# Patient Record
Sex: Male | Born: 1940 | Race: Black or African American | Hispanic: No | Smoking: Current every day smoker
Health system: Southern US, Community
[De-identification: ages and names within clinical notes are randomized; demographics above are authoritative.]

## PROBLEM LIST (undated history)

## (undated) DIAGNOSIS — Z720 Tobacco use: Secondary | ICD-10-CM

## (undated) DIAGNOSIS — I10 Essential (primary) hypertension: Secondary | ICD-10-CM

## (undated) DIAGNOSIS — I2510 Atherosclerotic heart disease of native coronary artery without angina pectoris: Secondary | ICD-10-CM

## (undated) HISTORY — PX: OTHER SURGICAL HISTORY: SHX169

---

## 1999-03-29 ENCOUNTER — Inpatient Hospital Stay (HOSPITAL_COMMUNITY): Admission: AD | Admit: 1999-03-29 | Discharge: 1999-03-30 | Payer: Self-pay | Admitting: Cardiology

## 2006-01-15 ENCOUNTER — Ambulatory Visit (HOSPITAL_COMMUNITY): Admission: RE | Admit: 2006-01-15 | Discharge: 2006-01-15 | Payer: Self-pay | Admitting: Family Medicine

## 2010-11-17 ENCOUNTER — Inpatient Hospital Stay (HOSPITAL_COMMUNITY)
Admission: EM | Admit: 2010-11-17 | Discharge: 2010-11-23 | DRG: 669 | Disposition: A | Discharge status: Home Health | Payer: Medicare Other | Attending: Internal Medicine | Admitting: Internal Medicine

## 2010-11-17 ENCOUNTER — Encounter: Payer: Self-pay | Admitting: Emergency Medicine

## 2010-11-17 ENCOUNTER — Emergency Department (HOSPITAL_COMMUNITY): Payer: Medicare Other

## 2010-11-17 ENCOUNTER — Other Ambulatory Visit: Payer: Self-pay

## 2010-11-17 DIAGNOSIS — I10 Essential (primary) hypertension: Secondary | ICD-10-CM | POA: Diagnosis present

## 2010-11-17 DIAGNOSIS — N3289 Other specified disorders of bladder: Secondary | ICD-10-CM | POA: Diagnosis present

## 2010-11-17 DIAGNOSIS — Z720 Tobacco use: Secondary | ICD-10-CM | POA: Diagnosis present

## 2010-11-17 DIAGNOSIS — C679 Malignant neoplasm of bladder, unspecified: Principal | ICD-10-CM | POA: Diagnosis present

## 2010-11-17 DIAGNOSIS — D62 Acute posthemorrhagic anemia: Secondary | ICD-10-CM | POA: Diagnosis present

## 2010-11-17 DIAGNOSIS — I498 Other specified cardiac arrhythmias: Secondary | ICD-10-CM | POA: Diagnosis present

## 2010-11-17 DIAGNOSIS — R000 Tachycardia, unspecified: Secondary | ICD-10-CM | POA: Diagnosis present

## 2010-11-17 DIAGNOSIS — E119 Type 2 diabetes mellitus without complications: Secondary | ICD-10-CM | POA: Diagnosis present

## 2010-11-17 DIAGNOSIS — D500 Iron deficiency anemia secondary to blood loss (chronic): Secondary | ICD-10-CM | POA: Diagnosis present

## 2010-11-17 DIAGNOSIS — F17200 Nicotine dependence, unspecified, uncomplicated: Secondary | ICD-10-CM | POA: Diagnosis present

## 2010-11-17 DIAGNOSIS — R310 Gross hematuria: Secondary | ICD-10-CM | POA: Diagnosis present

## 2010-11-17 DIAGNOSIS — E872 Acidosis, unspecified: Secondary | ICD-10-CM | POA: Diagnosis present

## 2010-11-17 DIAGNOSIS — D696 Thrombocytopenia, unspecified: Secondary | ICD-10-CM | POA: Diagnosis present

## 2010-11-17 DIAGNOSIS — D649 Anemia, unspecified: Secondary | ICD-10-CM

## 2010-11-17 HISTORY — DX: Atherosclerotic heart disease of native coronary artery without angina pectoris: I25.10

## 2010-11-17 HISTORY — DX: Tobacco use: Z72.0

## 2010-11-17 HISTORY — DX: Essential (primary) hypertension: I10

## 2010-11-17 LAB — LACTIC ACID, PLASMA: Lactic Acid, Venous: 3.4 mmol/L — ABNORMAL HIGH (ref 0.5–2.2)

## 2010-11-17 LAB — DIFFERENTIAL
Basophils Absolute: 0 10*3/uL (ref 0.0–0.1)
Basophils Relative: 0 % (ref 0–1)
Eosinophils Absolute: 0 10*3/uL (ref 0.0–0.7)
Lymphs Abs: 1.1 10*3/uL (ref 0.7–4.0)
Monocytes Relative: 7 % (ref 3–12)
Neutro Abs: 5 10*3/uL (ref 1.7–7.7)

## 2010-11-17 LAB — URINALYSIS, ROUTINE W REFLEX MICROSCOPIC
Specific Gravity, Urine: 1.02 (ref 1.005–1.030)
Urobilinogen, UA: 4 mg/dL — ABNORMAL HIGH (ref 0.0–1.0)
pH: 7 (ref 5.0–8.0)

## 2010-11-17 LAB — CBC
HCT: 14.4 % — ABNORMAL LOW (ref 39.0–52.0)
MCH: 21.3 pg — ABNORMAL LOW (ref 26.0–34.0)
MCV: 69.6 fL — ABNORMAL LOW (ref 78.0–100.0)
Platelets: 97 10*3/uL — ABNORMAL LOW (ref 150–400)
RBC: 2.07 MIL/uL — ABNORMAL LOW (ref 4.22–5.81)

## 2010-11-17 LAB — COMPREHENSIVE METABOLIC PANEL
ALT: 8 U/L (ref 0–53)
AST: 11 U/L (ref 0–37)
CO2: 20 mEq/L (ref 19–32)
Calcium: 8.5 mg/dL (ref 8.4–10.5)
GFR calc non Af Amer: 51 mL/min — ABNORMAL LOW (ref >90)

## 2010-11-17 LAB — GLUCOSE, CAPILLARY
Glucose-Capillary: 143 mg/dL — ABNORMAL HIGH (ref 70–99)
Glucose-Capillary: 145 mg/dL — ABNORMAL HIGH (ref 70–99)

## 2010-11-17 LAB — RETICULOCYTES: Retic Ct Pct: 4.2 % — ABNORMAL HIGH (ref 0.4–3.1)

## 2010-11-17 LAB — VITAMIN B12: Vitamin B-12: 269 pg/mL (ref 211–911)

## 2010-11-17 LAB — APTT: aPTT: 32 seconds (ref 24–37)

## 2010-11-17 LAB — OCCULT BLOOD, POC DEVICE: Fecal Occult Bld: NEGATIVE

## 2010-11-17 LAB — PREPARE RBC (CROSSMATCH)

## 2010-11-17 LAB — URINE MICROSCOPIC-ADD ON

## 2010-11-17 LAB — ABO/RH: ABO/RH(D): B NEG

## 2010-11-17 LAB — PROTIME-INR: Prothrombin Time: 15.4 seconds — ABNORMAL HIGH (ref 11.6–15.2)

## 2010-11-17 MED ORDER — SODIUM CHLORIDE 0.9 % IV SOLN
80.0000 mg | Freq: Once | INTRAVENOUS | Status: AC
  Administered 2010-11-17: 80 mg via INTRAVENOUS
  Filled 2010-11-17: qty 80

## 2010-11-17 MED ORDER — ONDANSETRON HCL 4 MG PO TABS: 4.0000 mg | ORAL_TABLET | Freq: Four times a day (QID) | ORAL | Status: DC | PRN

## 2010-11-17 MED ORDER — ALUM & MAG HYDROXIDE-SIMETH 200-200-20 MG/5ML PO SUSP: 30.0000 mL | Freq: Four times a day (QID) | ORAL | Status: DC | PRN

## 2010-11-17 MED ORDER — SODIUM CHLORIDE 0.9 % IV SOLN
INTRAVENOUS | Status: DC
  Filled 2010-11-17 (×2): qty 1000

## 2010-11-17 MED ORDER — ACETAMINOPHEN 325 MG PO TABS: 650.0000 mg | ORAL_TABLET | Freq: Four times a day (QID) | ORAL | Status: DC | PRN

## 2010-11-17 MED ORDER — SODIUM CHLORIDE 0.9 % IV BOLUS (SEPSIS)
1000.0000 mL | Freq: Once | INTRAVENOUS | Status: AC
  Administered 2010-11-17: 1000 mL via INTRAVENOUS

## 2010-11-17 MED ORDER — SODIUM CHLORIDE 0.9 % IJ SOLN
INTRAMUSCULAR | Status: AC
  Administered 2010-11-17: 10 mL
  Filled 2010-11-17: qty 10

## 2010-11-17 MED ORDER — GUAIFENESIN-DM 100-10 MG/5ML PO SYRP: 5.0000 mL | ORAL_SOLUTION | ORAL | Status: DC | PRN

## 2010-11-17 MED ORDER — INSULIN ASPART 100 UNIT/ML ~~LOC~~ SOLN
0.0000 [IU] | Freq: Three times a day (TID) | SUBCUTANEOUS | Status: DC
  Administered 2010-11-17 – 2010-11-23 (×10): 1 [IU] via SUBCUTANEOUS
  Filled 2010-11-17: qty 3

## 2010-11-17 MED ORDER — NICOTINE 21 MG/24HR TD PT24
21.0000 mg | MEDICATED_PATCH | Freq: Every day | TRANSDERMAL | Status: DC
  Administered 2010-11-17 – 2010-11-23 (×6): 21 mg via TRANSDERMAL
  Filled 2010-11-17 (×9): qty 1

## 2010-11-17 MED ORDER — INSULIN ASPART 100 UNIT/ML ~~LOC~~ SOLN: 0.0000 [IU] | Freq: Every day | SUBCUTANEOUS | Status: DC

## 2010-11-17 MED ORDER — POTASSIUM CHLORIDE IN NACL 20-0.9 MEQ/L-% IV SOLN
INTRAVENOUS | Status: DC
  Administered 2010-11-17: 75 mL via INTRAVENOUS

## 2010-11-17 MED ORDER — ALBUTEROL SULFATE (5 MG/ML) 0.5% IN NEBU: 2.5000 mg | INHALATION_SOLUTION | RESPIRATORY_TRACT | Status: DC | PRN

## 2010-11-17 MED ORDER — SODIUM CHLORIDE 0.9 % IV SOLN: INTRAVENOUS | Status: DC

## 2010-11-17 MED ORDER — HYDROCODONE-ACETAMINOPHEN 5-325 MG PO TABS
1.0000 | ORAL_TABLET | ORAL | Status: DC | PRN
  Administered 2010-11-21 – 2010-11-22 (×2): 2 via ORAL
  Administered 2010-11-22 (×2): 1 via ORAL
  Administered 2010-11-22: 2 via ORAL
  Administered 2010-11-22 – 2010-11-23 (×5): 1 via ORAL
  Filled 2010-11-17: qty 1
  Filled 2010-11-17: qty 2
  Filled 2010-11-17: qty 1
  Filled 2010-11-17 (×2): qty 2
  Filled 2010-11-17 (×4): qty 1
  Filled 2010-11-17: qty 2

## 2010-11-17 MED ORDER — SODIUM CHLORIDE 0.9 % IV SOLN
999.0000 mL | Freq: Once | INTRAVENOUS | Status: AC
  Administered 2010-11-17: 13:00:00 via INTRAVENOUS

## 2010-11-17 MED ORDER — ACETAMINOPHEN 650 MG RE SUPP: 650.0000 mg | Freq: Four times a day (QID) | RECTAL | Status: DC | PRN

## 2010-11-17 MED ORDER — MORPHINE SULFATE 2 MG/ML IJ SOLN
2.0000 mg | INTRAMUSCULAR | Status: DC | PRN
  Administered 2010-11-18: 2 mg via INTRAVENOUS
  Filled 2010-11-17: qty 1

## 2010-11-17 MED ORDER — ONDANSETRON HCL 4 MG/2ML IJ SOLN: 4.0000 mg | Freq: Four times a day (QID) | INTRAMUSCULAR | Status: DC | PRN

## 2010-11-17 MED ORDER — SALINE SPRAY 0.65 % NA SOLN
1.0000 | NASAL | Status: DC | PRN
  Filled 2010-11-17: qty 44

## 2010-11-17 MED ORDER — INSULIN ASPART 100 UNIT/ML ~~LOC~~ SOLN: 0.0000 [IU] | SUBCUTANEOUS | Status: DC

## 2010-11-17 MED ORDER — DOCUSATE SODIUM 100 MG PO CAPS
100.0000 mg | ORAL_CAPSULE | Freq: Every day | ORAL | Status: DC
  Administered 2010-11-17 – 2010-11-23 (×6): 100 mg via ORAL
  Filled 2010-11-17 (×7): qty 1

## 2010-11-17 MED ORDER — DEXTROSE 5 % IV SOLN
1.0000 g | INTRAVENOUS | Status: DC
  Administered 2010-11-17 – 2010-11-22 (×4): 1 g via INTRAVENOUS
  Filled 2010-11-17 (×7): qty 1

## 2010-11-17 NOTE — ED Notes (Signed)
Report given to icu. Pt in [place].

## 2010-11-17 NOTE — ED Provider Notes (Signed)
History   Chart scribed for Charlena Cross, MD by Caryl Bis; the patient was seen in room [facility]; this patient's care was started at 1:00 PM.    CSN: [id number] Arrival date & time: 11/17/2010 11:54 AM  Chief Complaint  Patient presents with  . Hematuria  . Rectal Bleeding   HPI Isaac Hunter is a 70 y.o. male who presents to the Emergency Department complaining of hematuria. Pt reports hematuria of dark red blood onset "a few months ago" and has been occuring almost daily for the past month with associated persistent dizziness and generalized weakness. Also c/o mild DOE.  Also reports dark stools but denies any gross blood in stool. No h/o GI bleeding. Denies any pain with urination, dysuria, flank pain or abd pain. No recent injury, fall, or trauma. No n/v/d, f/c, syncope, or cp. No aggravating or alleviating factors. Pt has not been evaluated for these sx previously.  No PCP  Past Medical History  Diagnosis Date  . Hypertension     History reviewed. No pertinent past surgical history.  No family history on file.  History  Substance Use Topics  . Smoking status: Current Everyday Smoker -- 1.0 packs/day    Types: Cigarettes  . Smokeless tobacco: Not on file  . Alcohol Use: No      Review of Systems  Constitutional: Negative for fever and chills.  HENT: Negative for neck pain.   Respiratory: Negative for cough and shortness of breath.   Cardiovascular: Negative for chest pain and palpitations.  Gastrointestinal: Negative for nausea, vomiting, abdominal pain, diarrhea, blood in stool and anal bleeding.  Genitourinary: Positive for hematuria. Negative for dysuria, frequency and flank pain.  Musculoskeletal: Negative for myalgias and back pain.  Skin: Negative for rash and wound.  Neurological: Positive for dizziness. Negative for numbness and headaches.    Allergies  Review of patient's allergies indicates no known allergies.  Home Medications  No  current outpatient prescriptions on file.  BP 131/80  Pulse 103  Temp 97.4 F (36.3 C)  Resp 20  Ht 6' (1.829 m)  Wt 208 lb (94.348 kg)  BMI 28.21 kg/m2  SpO2 100%  Physical Exam  Constitutional: He is oriented to person, place, and time. He appears well-developed and well-nourished. No distress.  HENT:  Head: Normocephalic and atraumatic.  Nose: Nose normal.  Mouth/Throat: Oropharynx is clear and moist.  Eyes:       Conjunctival pallor  Neck: Normal range of motion. Neck supple.  Cardiovascular: Regular rhythm and intact distal pulses.  Exam reveals no gallop and no friction rub.   No murmur heard.      tachycardic  Pulmonary/Chest: Effort normal and breath sounds normal. No respiratory distress. He has no wheezes. He has no rales.  Abdominal: Soft. Bowel sounds are normal. He exhibits no distension. There is no tenderness.  Genitourinary: Guaiac negative stool.       Rectal exam: normal tone, stool color normal, no gross blood, hemoccult negative  Musculoskeletal: Normal range of motion. He exhibits no edema and no tenderness.  Neurological: He is alert and oriented to person, place, and time.  Skin: Skin is warm and dry. There is pallor.    ED Course  Procedures  Labs Reviewed  CBC - Abnormal; Notable for the following:    RBC 2.07 (*)    Hemoglobin 4.4 (*)    HCT 14.4 (*) RESULT REPEATED AND VERIFIED   MCV 69.6 (*)    MCH 21.3 (*)  RDW 16.2 (*)    Platelets 97 (*)    All other components within normal limits  COMPREHENSIVE METABOLIC PANEL - Abnormal; Notable for the following:    Glucose, Bld 153 (*)    Albumin 3.1 (*)    GFR calc non Af Amer 51 (*)    GFR calc Af Amer 59 (*)    All other components within normal limits  LACTIC ACID, PLASMA - Abnormal; Notable for the following:    Lactic Acid, Venous 3.4 (*)    All other components within normal limits  PROTIME-INR - Abnormal; Notable for the following:    Prothrombin Time 15.4 (*)    All other  components within normal limits  DIFFERENTIAL  LIPASE, BLOOD  APTT  OCCULT BLOOD, POC DEVICE  POCT OCCULT BLOOD STOOL, DEVICE  URINALYSIS, ROUTINE W REFLEX MICROSCOPIC  URINE CULTURE  TYPE AND SCREEN  PREPARE RBC (CROSSMATCH)  VITAMIN B12  FOLATE  IRON AND TIBC  FERRITIN  RETICULOCYTES   No results found.   Date: 11/17/2010  Rate: 104  Rhythm: sinus tachycardia  QRS Axis: normal  Intervals: QRS prolonged, QT prolonged  ST/T Wave abnormalities: nonspecific ST/T changes  Conduction Disutrbances:right bundle branch block  Narrative Interpretation: left ventricular hypertrophy with repolarization abnormality  Old EKG Reviewed: none available  1:30 PM Results and plan for admission and blood transfusion discussed with pt and family, questions answered, they understand and agree with plan.  1:48 PM Consult with hospitalist, Dr. Caryn Section, agrees to admit   MDM  GI bleed, iron deficiency anemia, folate deficiency, vitamin B 12 deficiency, hematuria, bladder cancer, colon cancer are all entertained among other possible etiologies in the patient's differential diagnosis.    IMPRESSION: No diagnosis found.  SCRIBE ATTESTATION: I personally performed the services described in this documentation, which was scribed in my presence. The recorded information has been reviewed and considered.      Charlena Cross, MD 11/17/10 1356

## 2010-11-17 NOTE — H&P (Signed)
Isaac Hunter MRN: [id number] DOB/AGE: 06-16-1940 70 y.o. Primary Care Physician:No primary provider on file. Admit date: 11/17/2010 Chief Complaint: Bloody urine HPI: Patient is a 70 year old man with a past medical history significant for nonobstructive coronary artery disease and hypertension, who presents to the emergency department today with a chief complaint bloody urine.  He first noticed blood in his urine approximately 3 months ago. It has been intermittent up until approximately 4 weeks ago, when blood in his urine occurred on a daily basis. He was hoping that it would stop. He has no complaints of pain with urination or bladder pain. Over the past several days, he has had urinary hesitancy and frequency but up until a few days ago, his urine flow has been within normal limits according to him. He has noticed a few blood clots in his urine as well. He denies black tarry stools or bright red blood per rectum. He has had lightheadedness but no vertiginous symptoms. He has also had easy fatigability. He denies chest pain, shortness of breath, chest palpitations, abdominal pain, fever, chills, or unusual swelling in his legs.  In the emergency department, the patient was noted to be initially tachycardic with a heart rate ranging from 120-130 beats per minute. He is afebrile. His EKG reveals sinus tachycardia with a heart rate of 104 beats per minute, prolonged QT interval, and nonspecific ST/T wave changes. His lab data are significant for a hemoglobin of 4.4, hematocrit of 14.4, platelet count of 97, and a venous glucose of 153. His urinalysis reveals too numerous to count RBCs, positive nitrite, and moderate leukocytes. In my discussion with the emergency department physician, it was recommended that a renal ultrasound be ordered. The results are now back and the most significant finding is that the patient has a 7 cm bladder mass. He is being admitted for further evaluation and  management.  Past Medical History  Diagnosis Date  . Hypertension   . CAD (coronary artery disease)     Non obstructive per cath 2001.  . Diabetes mellitus     "Borderline"    Past Surgical History  Procedure Date  . Left hand surgery     Prior to Admission medications   Not on File    Allergies: No Known Allergies  Family history: The patient's mother died of cancer  , however, he does not recall what type of cancer it was. His father died of complications from a tractor accident.   Social History: The patient is a widower. He lives in [place], [place]. He has 4 children. He is a retired Administrator. He smokes one pack of cigarettes per day and has been doing so for approximately 50 years. He denies alcohol and illicit drug use.     ROS: As above in the history of present illness. Otherwise, review of systems is negative.   PHYSICAL EXAM: Blood pressure 143/98, pulse 107, temperature 98 F (36.7 C), temperature source Oral, resp. rate 20, height 5\' 6"  (1.676 m), weight 91.3 kg (201 lb 4.5 oz), SpO2 99.00%. General:The patient is currently sitting up in bed, in no acute distress.  HEENT: Head is normocephalic, nontraumatic. Pupils are equal, round, and reactive to light. Extraocular movements are intact. Conjunctivae are clear and sclerae are white. Tympanic membranes not examined. Nasal mucosa is mildly dry. Oropharynx reveals poor dentition. Mucous membranes are dry. No posterior exudates or erythema. Lungs: Clear to auscultation bilaterally. Heart: S1, S2, with tachycardia. Abdomen: Positive bowel sounds, mildly obese, nontender,  nondistended. GU: Exam deferred, however the patient does have grossly bloody urine in the urinal Rectal: Deferred, however, the emergency department physician stated that the patient's stool was brown and guaiac negative. Extremities: Pedal pulses barely palpable. Trace of pedal edema bilaterally. Neurologic: Alert and oriented x3.  Cranial nerves II through XII intact.     Basic Metabolic Panel:  Basename 11/17/10 1223  NA 137  K 3.7  CL 105  CO2 20  GLUCOSE 153*  BUN 17  CREATININE 1.35  CALCIUM 8.5  MG --  PHOS --   Liver Function Tests:  Basename 11/17/10 1223  AST 11  ALT 8  ALKPHOS 65  BILITOT 0.3  PROT 6.1  ALBUMIN 3.1*    Basename 11/17/10 1223  LIPASE 24  AMYLASE --   No results found for this basename: AMMONIA:2 in the last 72 hours CBC:  Basename 11/17/10 1223  WBC 6.6  NEUTROABS 5.0  HGB 4.4*  HCT 14.4*  MCV 69.6*  PLT 97*   Cardiac Enzymes: No results found for this basename: CKTOTAL:3,CKMB:3,CKMBINDEX:3,TROPONINI:3 in the last 72 hours BNP: No results found for this basename: POCBNP:3 in the last 72 hours D-Dimer: No results found for this basename: DDIMER:2 in the last 72 hours CBG: No results found for this basename: GLUCAP:6 in the last 72 hours Hemoglobin A1C: No results found for this basename: HGBA1C in the last 72 hours Fasting Lipid Panel: No results found for this basename: CHOL,HDL,LDLCALC,TRIG,CHOLHDL,LDLDIRECT in the last 72 hours Thyroid Function Tests: No results found for this basename: TSH,T4TOTAL,FREET4,T3FREE,THYROIDAB in the last 72 hours Anemia Panel:  Basename 11/17/10 1354  VITAMINB12 --  FOLATE --  FERRITIN --  TIBC --  IRON --  RETICCTPCT 4.2*   Urine Drug Screen:  Alcohol Level: No results found for this basename: ETH:2 in the last 72 hours Urinalysis: Urine color red, urine appearance hazy, urine specific gravity 1.020, urine glucose 100, urine bilirubin negative, urine ketones 15, urine protein 300, urine nitrite positive, urine leukocytes moderate, 7-10 WBCs, too numerous to count RBCs, and few bacteria.  Misc. Labs:     No results found for this or any previous visit (from the past 240 hour(s)).   Results for orders placed during the hospital encounter of 11/17/10 (from the past 48 hour(s))  CBC     Status: Abnormal    Collection Time   11/17/10 12:23 PM      Component Value Range Comment   WBC 6.6  4.0 - 10.5 (K/uL)    RBC 2.07 (*) 4.22 - 5.81 (MIL/uL)    Hemoglobin 4.4 (*) 13.0 - 17.0 (g/dL)    HCT 14.4 (*) 39.0 - 52.0 (%) RESULT REPEATED AND VERIFIED   MCV 69.6 (*) 78.0 - 100.0 (fL)    MCH 21.3 (*) 26.0 - 34.0 (pg)    MCHC 30.6  30.0 - 36.0 (g/dL)    RDW 16.2 (*) 11.5 - 15.5 (%)    Platelets 97 (*) 150 - 400 (K/uL)   DIFFERENTIAL     Status: Normal   Collection Time   11/17/10 12:23 PM      Component Value Range Comment   Neutrophils Relative 76  43 - 77 (%)    Lymphocytes Relative 17  12 - 46 (%)    Monocytes Relative 7  3 - 12 (%)    Eosinophils Relative 0  0 - 5 (%)    Basophils Relative 0  0 - 1 (%)    Neutro Abs 5.0  1.7 -  7.7 (K/uL)    Lymphs Abs 1.1  0.7 - 4.0 (K/uL)    Monocytes Absolute 0.5  0.1 - 1.0 (K/uL)    Eosinophils Absolute 0.0  0.0 - 0.7 (K/uL)    Basophils Absolute 0.0  0.0 - 0.1 (K/uL)    RBC Morphology SCHISTOCYTES PRESENT (2-5/hpf)      Smear Review PLATELET COUNT CONFIRMED BY SMEAR   PLATELETS APPEAR DECREASED  COMPREHENSIVE METABOLIC PANEL     Status: Abnormal   Collection Time   11/17/10 12:23 PM      Component Value Range Comment   Sodium 137  135 - 145 (mEq/L)    Potassium 3.7  3.5 - 5.1 (mEq/L)    Chloride 105  96 - 112 (mEq/L)    CO2 20  19 - 32 (mEq/L)    Glucose, Bld 153 (*) 70 - 99 (mg/dL)    BUN 17  6 - 23 (mg/dL)    Creatinine, Ser 1.35  0.50 - 1.35 (mg/dL)    Calcium 8.5  8.4 - 10.5 (mg/dL)    Total Protein 6.1  6.0 - 8.3 (g/dL)    Albumin 3.1 (*) 3.5 - 5.2 (g/dL)    AST 11  0 - 37 (U/L)    ALT 8  0 - 53 (U/L)    Alkaline Phosphatase 65  39 - 117 (U/L)    Total Bilirubin 0.3  0.3 - 1.2 (mg/dL)    GFR calc non Af Amer 51 (*) >90 (mL/min)    GFR calc Af Amer 59 (*) >90 (mL/min)   LIPASE, BLOOD     Status: Normal   Collection Time   11/17/10 12:23 PM      Component Value Range Comment   Lipase 24  11 - 59 (U/L)   APTT     Status: Normal    Collection Time   11/17/10 12:23 PM      Component Value Range Comment   aPTT 32  24 - 37 (seconds)   PROTIME-INR     Status: Abnormal   Collection Time   11/17/10 12:23 PM      Component Value Range Comment   Prothrombin Time 15.4 (*) 11.6 - 15.2 (seconds)    INR 1.19  0.00 - 1.49    LACTIC ACID, PLASMA     Status: Abnormal   Collection Time   11/17/10 12:24 PM      Component Value Range Comment   Lactic Acid, Venous 3.4 (*) 0.5 - 2.2 (mmol/L)   URINALYSIS, ROUTINE W REFLEX MICROSCOPIC     Status: Abnormal   Collection Time   11/17/10 12:48 PM      Component Value Range Comment   Color, Urine RED (*) YELLOW  BIOCHEMICALS MAY BE AFFECTED BY COLOR   Appearance HAZY (*) CLEAR     Specific Gravity, Urine 1.020  1.005 - 1.030     pH 7.0  5.0 - 8.0     Glucose, UA 100 (*) NEGATIVE (mg/dL)    Hgb urine dipstick LARGE (*) NEGATIVE     Bilirubin Urine NEGATIVE  NEGATIVE     Ketones, ur 15 (*) NEGATIVE (mg/dL)    Protein, ur >300 (*) NEGATIVE (mg/dL)    Urobilinogen, UA 4.0 (*) 0.0 - 1.0 (mg/dL)    Nitrite POSITIVE (*) NEGATIVE     Leukocytes, UA MODERATE (*) NEGATIVE    URINE MICROSCOPIC-ADD ON     Status: Abnormal   Collection Time   11/17/10 12:48 PM  Component Value Range Comment   WBC, UA 7-10  <3 (WBC/hpf)    RBC / HPF TOO NUMEROUS TO COUNT  <3 (RBC/hpf)    Bacteria, UA FEW (*) RARE    OCCULT BLOOD, POC DEVICE     Status: Normal   Collection Time   11/17/10  1:31 PM      Component Value Range Comment   Fecal Occult Bld NEGATIVE     TYPE AND SCREEN     Status: Normal (Preliminary result)   Collection Time   11/17/10  1:50 PM      Component Value Range Comment   ABO/RH(D) B NEG      Antibody Screen NEG      Sample Expiration 11/20/2010      Unit Number [id number]      Blood Component Type RED CELLS,LR      Unit division 00      Status of Unit ALLOCATED      Transfusion Status OK TO TRANSFUSE      Crossmatch Result Compatible      Unit Number [id number]      Blood  Component Type RED CELLS,LR      Unit division 00      Status of Unit ISSUED      Transfusion Status OK TO TRANSFUSE      Crossmatch Result Compatible      Unit Number [id number]      Blood Component Type RED CELLS,LR      Unit division 00      Status of Unit ALLOCATED      Transfusion Status OK TO TRANSFUSE      Crossmatch Result Compatible      Unit Number [id number]      Blood Component Type RED CELLS,LR      Unit division 00      Status of Unit ALLOCATED      Transfusion Status OK TO TRANSFUSE      Crossmatch Result Compatible     PREPARE RBC (CROSSMATCH)     Status: Normal   Collection Time   11/17/10  1:50 PM      Component Value Range Comment   Order Confirmation ORDER PROCESSED BY BLOOD BANK     ABO/RH     Status: Normal   Collection Time   11/17/10  1:50 PM      Component Value Range Comment   ABO/RH(D) B NEG     RETICULOCYTES     Status: Abnormal   Collection Time   11/17/10  1:54 PM      Component Value Range Comment   Retic Ct Pct 4.2 (*) 0.4 - 3.1 (%)    RBC. 2.06 (*) 4.22 - 5.81 (MIL/uL)    Retic Count, Manual 86.5  19.0 - 186.0 (K/uL)     US Renal  11/17/2010  *RADIOLOGY REPORT*  Clinical Data: Hematuria  RENAL/URINARY TRACT ULTRASOUND COMPLETE  Comparison:  None.  Findings:  Right Kidney:  10.2 cm length.  No hydronephrosis.  Normal cortex and echogenicity.  No focal abnormality.  Left Kidney:  11.7 cm length.  Normal cortex and echogenicity.  No hydronephrosis or focal abnormality.  Bladder:  Hyperechoic solid appearing dependent bladder mass noted measuring 4.7 x 3.5 x 7.0 cm.  This is nonspecific by ultrasound and could represent intraluminal bladder clot/thrombus versus a solid mass/neoplasm.  No abdominal free fluid demonstrated.  IMPRESSION: Negative for hydronephrosis or acute obstruction.  7 cm echogenic heterogeneous dependent bladder mass could represent clot/thrombus versus  mass or neoplasm.  This warrants further evaluation.  Original Report  Authenticated By: Jerilynn Mages. Daryll Brod, M.D.    Impression:  Principal Problem:  *Hematuria Active Problems:  Anemia due to blood loss  Thrombocytopenia  Sinus tachycardia  Lactic acidosis  HTN (hypertension)  Bladder mass  DM type 2 (diabetes mellitus, type 2)  Tobacco abuse  1. Gross hematuria and associated bladder mass. This is a malignancy until proven otherwise.   Acute blood loss anemia. The patient's hemoglobin is 4.4.  Possible urinary tract infection.  Thrombocytopenia. Etiology is unknown at this time.  Sinus tachycardia, secondary to volume depletion.  Lactic acidosis of unknown significance.  Diet-controlled type 2 diabetes mellitus. The patient has admittedly stopped taking an oral hypoglycemic agent years ago. He has not seen a physician in over 4 years.  Hypertension. Again, the patient has not been treated in several years. For now, his blood pressure is tolerable in the setting of GU bleeding.  Tobacco abuse. The patient was advised to stop smoking.   Plan:   1. 4 units of packed red blood cells have been typed and screened. We will go ahead and transfuse 2 units this afternoon and recheck a CBC one hour afterwards. It is likely he will need more transfusions, however, we will transfuse just 2 units today.  Dr. Michela Pitcher, urologist, has been consulted.  An anemia panel was ordered prior to the transfusions. We will check the results.  Will start IV fluids for hydration.  Will start Rocephin empirically. Urine culture has been ordered and is pending.  Will order a nicotine patch and tobacco cessation counseling.  We'll order hemoglobin A1c and monitor her his capillary blood glucose. We'll start a carbohydrate modified diet.         [place] 11/17/2010, 4:11 PM

## 2010-11-17 NOTE — ED Notes (Signed)
Pt states has had BRB bleeding almost daily for one month. Pt complaining of dizziness for also a month.

## 2010-11-17 NOTE — ED Notes (Signed)
CRITICAL VALUE ALERT  Critical value received: hgb 4.4, hct 14.4  Date of notification:  11/17/10  Time of notification:1:15  Critical value read back: yes  Nurse who received alert:  Ilda Mori  MD notified (1st page):  connor  Time of first page:  1:15  MD notified (2nd page):  Time of second page:  Responding MD: connor  Time MD responded: 1:15

## 2010-11-18 ENCOUNTER — Inpatient Hospital Stay (HOSPITAL_COMMUNITY): Payer: Medicare Other

## 2010-11-18 ENCOUNTER — Encounter (HOSPITAL_COMMUNITY): Payer: Self-pay | Admitting: Internal Medicine

## 2010-11-18 LAB — T4, FREE: Free T4: 1.03 ng/dL (ref 0.80–1.80)

## 2010-11-18 LAB — CBC
HCT: 20.9 % — ABNORMAL LOW (ref 39.0–52.0)
HCT: 23.3 % — ABNORMAL LOW (ref 39.0–52.0)
HCT: 25 % — ABNORMAL LOW (ref 39.0–52.0)
Hemoglobin: 5.5 g/dL — CL (ref 13.0–17.0)
Hemoglobin: 6.7 g/dL — CL (ref 13.0–17.0)
Hemoglobin: 7.4 g/dL — ABNORMAL LOW (ref 13.0–17.0)
Hemoglobin: 8.2 g/dL — ABNORMAL LOW (ref 13.0–17.0)
MCH: 24.4 pg — ABNORMAL LOW (ref 26.0–34.0)
MCHC: 32.1 g/dL (ref 30.0–36.0)
MCV: 73.9 fL — ABNORMAL LOW (ref 78.0–100.0)
MCV: 76 fL — ABNORMAL LOW (ref 78.0–100.0)
MCV: 76.9 fL — ABNORMAL LOW (ref 78.0–100.0)
Platelets: 87 10*3/uL — ABNORMAL LOW (ref 150–400)
Platelets: 86 10*3/uL — ABNORMAL LOW (ref 150–400)
RBC: 2.38 MIL/uL — ABNORMAL LOW (ref 4.22–5.81)
RBC: 2.75 MIL/uL — ABNORMAL LOW (ref 4.22–5.81)
RBC: 3.08 MIL/uL — ABNORMAL LOW (ref 4.22–5.81)
RBC: 3.25 MIL/uL — ABNORMAL LOW (ref 4.22–5.81)
RDW: 20.3 % — ABNORMAL HIGH (ref 11.5–15.5)
WBC: 8.3 10*3/uL (ref 4.0–10.5)
WBC: 8.8 10*3/uL (ref 4.0–10.5)
WBC: 10.6 10*3/uL — ABNORMAL HIGH (ref 4.0–10.5)

## 2010-11-18 LAB — PREPARE RBC (CROSSMATCH)

## 2010-11-18 LAB — HEMOGLOBIN A1C
Hgb A1c MFr Bld: 6.5 % — ABNORMAL HIGH (ref <5.7)
Mean Plasma Glucose: 140 mg/dL — ABNORMAL HIGH (ref <117)

## 2010-11-18 LAB — GLUCOSE, CAPILLARY
Glucose-Capillary: 132 mg/dL — ABNORMAL HIGH (ref 70–99)
Glucose-Capillary: 121 mg/dL — ABNORMAL HIGH (ref 70–99)
Glucose-Capillary: 163 mg/dL — ABNORMAL HIGH (ref 70–99)

## 2010-11-18 LAB — IRON AND TIBC
Iron: <10 ug/dL — ABNORMAL LOW (ref 42–135)
UIBC: 359 ug/dL (ref 125–400)

## 2010-11-18 LAB — COMPREHENSIVE METABOLIC PANEL
Alkaline Phosphatase: 63 U/L (ref 39–117)
BUN: 13 mg/dL (ref 6–23)
Creatinine, Ser: 1.17 mg/dL (ref 0.50–1.35)
GFR calc Af Amer: 71 mL/min — ABNORMAL LOW (ref >90)
Glucose, Bld: 135 mg/dL — ABNORMAL HIGH (ref 70–99)
Potassium: 3.8 mEq/L (ref 3.5–5.1)
Total Protein: 5.8 g/dL — ABNORMAL LOW (ref 6.0–8.3)

## 2010-11-18 LAB — URINE CULTURE: Culture  Setup Time: 201210112102

## 2010-11-18 LAB — LACTIC ACID, PLASMA: Lactic Acid, Venous: 0.8 mmol/L (ref 0.5–2.2)

## 2010-11-18 LAB — TSH: TSH: 1.179 u[IU]/mL (ref 0.350–4.500)

## 2010-11-18 MED ORDER — LEVALBUTEROL HCL 0.63 MG/3ML IN NEBU
0.6300 mg | INHALATION_SOLUTION | Freq: Four times a day (QID) | RESPIRATORY_TRACT | Status: DC
  Administered 2010-11-18 – 2010-11-23 (×18): 0.63 mg via RESPIRATORY_TRACT
  Filled 2010-11-18 (×17): qty 3

## 2010-11-18 MED ORDER — FUROSEMIDE 10 MG/ML IJ SOLN
INTRAMUSCULAR | Status: AC
  Administered 2010-11-18: 30 mg
  Filled 2010-11-18: qty 4

## 2010-11-18 MED ORDER — FUROSEMIDE 10 MG/ML IJ SOLN
30.0000 mg | Freq: Once | INTRAMUSCULAR | Status: AC
  Administered 2010-11-18: 30 mg via INTRAVENOUS

## 2010-11-18 MED ORDER — DILTIAZEM HCL 60 MG PO TABS
120.0000 mg | ORAL_TABLET | Freq: Every day | ORAL | Status: DC
Start: 2010-11-18 — End: 2010-11-18

## 2010-11-18 MED ORDER — LEVALBUTEROL HCL 0.63 MG/3ML IN NEBU
INHALATION_SOLUTION | RESPIRATORY_TRACT | Status: AC
  Filled 2010-11-18: qty 3

## 2010-11-18 MED ORDER — LEVALBUTEROL HCL 0.63 MG/3ML IN NEBU: 0.6300 mg | INHALATION_SOLUTION | RESPIRATORY_TRACT | Status: DC | PRN

## 2010-11-18 MED ORDER — POTASSIUM CHLORIDE CRYS ER 20 MEQ PO TBCR
40.0000 meq | EXTENDED_RELEASE_TABLET | Freq: Two times a day (BID) | ORAL | Status: AC
  Administered 2010-11-18 (×2): 40 meq via ORAL
  Filled 2010-11-18 (×2): qty 2

## 2010-11-18 NOTE — Progress Notes (Signed)
CRITICAL VALUE ALERT  Critical value received:  Hemoglobin 6.7  Date of notification:  11-18-10  Time of notification:  0655  Critical value read back:yes  Nurse who received alert:  Myra Gianotti RN   MD notified (1st page): [place]  Time of first page:  [phone]  MD notified (2nd page):  Time of second page:  Responding MD: waiting for MD to return page  Time MD responded:  Waiting for return page

## 2010-11-18 NOTE — Consult Note (Addendum)
Results for STRATON, RUMPLE (MRN [id number]) as of 11/18/2010 15:35  Ref. Range 11/18/2010 06:02  Sodium Latest Range: 135-145 mEq/L 139  Potassium Latest Range: 3.5-5.1 mEq/L 3.8  Chloride Latest Range: 96-112 mEq/L 110  CO2 Latest Range: 19-32 mEq/L 22  BUN Latest Range: 6-23 mg/dL 13  Creat Latest Range: 0.50-1.35 mg/dL 1.17  Calcium Latest Range: 8.4-10.5 mg/dL 8.1 (L)  GFR calc non Af Amer Latest Range: >90 mL/min 61 (L)  GFR calc Af Amer Latest Range: >90 mL/min 71 (L)  Glucose Latest Range: 70-99 mg/dL 135 (H)  Magnesium Latest Range: 1.5-2.5 mg/dL 2.2  Alkaline Phosphatase Latest Range: 39-117 U/L 63  Albumin Latest Range: 3.5-5.2 g/dL 2.9 (L)  AST Latest Range: 0-37 U/L 11  ALT Latest Range: 0-53 U/L 7  Total Protein Latest Range: 6.0-8.3 g/dL 5.8 (L)  Total Bilirubin Latest Range: 0.3-1.2 mg/dL 0.5  Lactic Acid, Venous Latest Range: 0.5-2.2 mmol/L 0.8  WBC Latest Range: 4.0-10.5 K/uL 8.8  RBC Latest Range: 4.22-5.81 MIL/uL 2.75 (L)  HGB Latest Range: 13.0-17.0 g/dL 6.7 (LL)  HCT Latest Range: 39.0-52.0 % 20.9 (L)  MCV Latest Range: 78.0-100.0 fL 76.0 (L)  MCH Latest Range: 26.0-34.0 pg 24.4 (L)  MCHC Latest Range: 30.0-36.0 g/dL 32.1  RDW Latest Range: 11.5-15.5 % 20.3 (H)  Platelets Latest Range: 150-400 K/uL 86 (L)   Dictation number [id number]  Filed Vitals:   11/18/10 1200  BP:   Pulse: 96  Temp: 98.5 F (36.9 C)  Resp: 24

## 2010-11-18 NOTE — Consult Note (Signed)
NAME:  Isaac Hunter, Isaac Hunter NO.:  [id number]  MEDICAL RECORD NO.:  [id number]  LOCATION:  IC05                          FACILITY:  [facility]  PHYSICIAN:  Marissa Nestle, M.D.DATE OF BIRTH:  03-May-1940  DATE OF CONSULTATION: DATE OF DISCHARGE:                                CONSULTATION   Isaac Hunter is a 70 year old gentleman who is admitted by the Hospitalist Service.  He came to the emergency room with history of having nonobstructive coronary artery disease and hypertension. Basically, he says he is in good health and never had any surgery but for the last 3 months on and off, he was having bleeding, having gross hematuria and for 4 weeks, he was continuously having gross hematuria, and he was hoping it will stop, but it continued, so he came to the emergency room.  Blood workup showed that his hemoglobin is only 4, although he has no symptoms.  He has basically no voiding difficulty. He has passed a few blood clots.  He has some slow and weak stream and hesitancy which just started a couple of weeks ago.  An abdominal ultrasound was done, it showed there is a mass in the bladder.  We suspect that he may have a tumor in the bladder.  The patient is admitted by the Hospitalist Service to manage his anemia and then I was called in to see him.  PAST MEDICAL HISTORY:  He has hypertension, coronary artery disease. Borderline diabetes.  PAST SURGICAL HISTORY:  Only surgery he had some surgery on his left hand.  MEDICATIONS:  He does not take any medications.  ALLERGIES:  No known drug allergies.  FAMILY HISTORY:  There is no history of prostate cancer in the family.  SOCIAL HISTORY:  The patient is a widower and has 4 children.  Retired Administrator.  He smokes 1 pack of cigarettes per day and has been doing that for approximately 50 years.  Denies alcohol or illicit drug use.  PHYSICAL EXAMINATION:  VITAL SIGNS:  Blood pressure 143/98, pulse 104 per minute,  temperature 98. ABDOMEN:  Soft, flat.  Liver, spleen, kidneys not palpable.  No CVA tenderness. GU:  External genitalia is unremarkable. RECTAL:  Deferred. EXTREMITIES:  Normal.  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  In the next couple of days, I will give him more blood to bring his hematocrit over 30, right now it is 14.  He will need several units of blood.  I appreciate Dr. Caryn Section letting me to see this patient.     Marissa Nestle, M.D.     MIJ/MEDQ  D:  11/17/2010  T:  11/18/2010  Job:  [id number]

## 2010-11-18 NOTE — Progress Notes (Signed)
Post transfusion h/h 5.5. Patient still with ongoing blood in urine. A single additional unit PRBC ordered.

## 2010-11-18 NOTE — Progress Notes (Signed)
#  22 3 WAY IRRIGATION FOLEY CATHETER INSERTED AS ORDERED.Marland KitchenCONTINUOUS NS BLADDER IRRIGATION STARTED. PT UNCOMFORTABLE. C/O BLADDER PRESSURE. SEVERAL CLOTS EVACUATED.

## 2010-11-18 NOTE — Progress Notes (Signed)
Subjective: The patient is uncomfortable as his Foley catheter is getting ready to be flushed. He has had a number of blood clots apparently causing some Foley obstruction. He also complains of chest congestion.  Objective: Vital signs in last 24 hours: Filed Vitals:   11/18/10 0445 11/18/10 0449 11/18/10 0500 11/18/10 0600  BP:   173/78 169/88  Pulse: 96 95 93   Temp:  97.6 F (36.4 C)    TempSrc:      Resp: 33 35 30 27  Height:      Weight:   92.3 kg (203 lb 7.8 oz)   SpO2:   98%     Intake/Output Summary (Last 24 hours) at 11/18/10 0746 Last data filed at 11/18/10 0600  Gross per 24 hour  Intake 2712.91 ml  Output   1750 ml  Net 962.91 ml    Weight change:   Exam: General: The patient is describing some discomfort over his bladder. He is in some distress. Lungs: Bilateral crackles and occasional wheezes. Heart: S1, S2, with mild tachycardia. Abdomen: Mildly distended, positive bowel sounds, mildly tender over his bladder, no hepatosplenomegaly. GU: Foley catheter has been inserted which was done overnight. His urine is grossly bloody with evidence of blood clots in the Foley bag. Extremities: Trace of pedal edema.  Lab Results: Basic Metabolic Panel:  Basename 11/18/10 0602 11/17/10 1223  NA 139 137  K 3.8 3.7  CL 110 105  CO2 22 20  GLUCOSE 135* 153*  BUN 13 17  CREATININE 1.17 1.35  CALCIUM 8.1* 8.5  MG 2.2 --  PHOS -- --   Liver Function Tests:  [facility] 11/18/10 0602 11/17/10 1223  AST 11 11  ALT 7 8  ALKPHOS 63 65  BILITOT 0.5 0.3  PROT 5.8* 6.1  ALBUMIN 2.9* 3.1*    Basename 11/17/10 1223  LIPASE 24  AMYLASE --   No results found for this basename: AMMONIA:2 in the last 72 hours CBC:  Basename 11/18/10 0602 11/17/10 2320 11/17/10 1223  WBC 8.8 8.3 --  NEUTROABS -- -- 5.0  HGB 6.7* 5.5* --  HCT 20.9* 17.6* --  MCV 76.0* 73.9* --  PLT 86* 87* --   Cardiac Enzymes: No results found for this basename:  CKTOTAL:3,CKMB:3,CKMBINDEX:3,TROPONINI:3 in the last 72 hours BNP: No results found for this basename: POCBNP:3 in the last 72 hours D-Dimer: No results found for this basename: DDIMER:2 in the last 72 hours CBG:  Basename 11/17/10 2135 11/17/10 1644  GLUCAP 145* 143*   Hemoglobin A1C:  Basename 11/17/10 1354  HGBA1C 6.5*   Fasting Lipid Panel: No results found for this basename: CHOL,HDL,LDLCALC,TRIG,CHOLHDL,LDLDIRECT in the last 72 hours Thyroid Function Tests:  Basename 11/17/10 1354  TSH 1.179  T4TOTAL --  FREET4 1.03  T3FREE --  THYROIDAB --   Anemia Panel:  Basename 11/17/10 1354  VITAMINB12 269  FOLATE 11.9  FERRITIN 12*  TIBC --  IRON --  RETICCTPCT 4.2*   Urine Drug Screen:  Alcohol Level: No results found for this basename: ETH:2 in the last 72 hours Urinalysis:  Misc. Labs:   Micro: Recent Results (from the past 240 hour(s))  MRSA PCR SCREENING     Status: Normal   Collection Time   11/17/10  3:05 PM      Component Value Range Status Comment   MRSA by PCR NEGATIVE  NEGATIVE  Final     Studies/Results: US Renal  11/17/2010  *RADIOLOGY REPORT*  Clinical Data: Hematuria  RENAL/URINARY TRACT ULTRASOUND COMPLETE  Comparison:  None.  Findings:  Right Kidney:  10.2 cm length.  No hydronephrosis.  Normal cortex and echogenicity.  No focal abnormality.  Left Kidney:  11.7 cm length.  Normal cortex and echogenicity.  No hydronephrosis or focal abnormality.  Bladder:  Hyperechoic solid appearing dependent bladder mass noted measuring 4.7 x 3.5 x 7.0 cm.  This is nonspecific by ultrasound and could represent intraluminal bladder clot/thrombus versus a solid mass/neoplasm.  No abdominal free fluid demonstrated.  IMPRESSION: Negative for hydronephrosis or acute obstruction.  7 cm echogenic heterogeneous dependent bladder mass could represent clot/thrombus versus mass or neoplasm.  This warrants further evaluation.  Original Report Authenticated By: Jerilynn Mages. Daryll Brod, M.D.    Medications: I have reviewed the patient's current medications.  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 Dr. Silvano Rusk evaluation has been noted and appreciated.  Acute/subacute blood loss anemia. He has been transfused 3 units of packed red blood cells. His hemoglobin has increased from 4.4 to 6.7 today. His anemia panel is significant for iron deficiency, which is not surprising. He will need another unit.  Bilateral pulmonary crackles and wheezes. This may be secondary to IV fluid hydration and blood transfusions. He does not appear to be in extremis now, however ,we will need to give him Lasix and check a chest x-ray.  Thrombocytopenia. Etiology unclear at this time. It could be secondary to the malignancy.  Lactic acidosis, resolved.  Type 2 diabetes mellitus. The patient had been treated with diet alone. His hemoglobin A1c is noted to be 6.5. For now, we will continue sliding scale NovoLog and carbohydrate modified diet.   Hypertension. This will be monitored for the need to start an antihypertensive medication. His blood pressure may be up in part secondary to discomfort and the volume expansion from the blood transfusions.  Tobacco abuse. Counseling ordered. Nicotine patch placed.        Plan:  1. We will decrease the IV fluids to [facility]. We will give him 30 milligrams of IV Lasix. We will order a chest x-ray for evaluation.  We will transfuse him one more unit of packed red blood cells. We will type and cross 2 more units to stay ahead. We will monitor his CBC every 6 hours for the next 24 hours.  Per Dr. Michela Pitcher, he plans a cystoscopy and probable resection of the tumor next week  on Monday.  We will start an antihypertensive medication if needed over the next 24-48 hours.    LOS: 1 day   Millianna Szymborski 11/18/2010, 7:46 AM

## 2010-11-18 NOTE — Progress Notes (Signed)
Called MD on call with results from 11-17-10 2300 H/H and order received to transfuse another unit of blood.

## 2010-11-18 NOTE — Progress Notes (Signed)
PT C/O SEVERE  BLADDER DISCOMFORT. FOLEY CATH NOT DRAINING. ATTEMPTED TO IRRIGATE. UNABLE. #16 CATH REMOVED. SEVERAL LARGE BLOODY CLOTS EXPELLED.#20 FOLEY CATH INSERTED  USING STERILE TECHNIQUE. CATHETER IRR\AGATED W/ 50CC NS. MORE CLOTS EXPELLED. THYEN FOLEY BEGAN TO DRAIN CLEAR BLOOD TINGED URINE.Marland Kitchen BLADDETR PRESURE/DISCOMFORT RELIEVED.PT  TOLERATED PROCEDURE WELL.  HE IS MUCH MORE COMFORTABLE AT THIS TIME. DR Caryn Section ALSO IN TO EXAMINE AT THIS TIME.

## 2010-11-18 NOTE — Progress Notes (Signed)
PT CONTINUES TO HAVE PROBLEMS W/ FOLEY CATHETER CLOTTING OFF URINE FLOW.WILL NOT ALWAYS CLEAR W/ IRRIGATION. FOLEY CATH  MUST BE CHANGED AT TIMES. DR Caryn Section NOTIFIED. RN TO GET IN TOUCH W/ DR Michela Pitcher

## 2010-11-18 NOTE — Progress Notes (Signed)
Catheter was severely leaking, with some blood clots in the urine. Removed catheter and placed a 29F foley catheter. Will continue to monitor for leaking and clots.

## 2010-11-19 LAB — BASIC METABOLIC PANEL
Chloride: 106 mEq/L (ref 96–112)
GFR calc Af Amer: 74 mL/min — ABNORMAL LOW (ref >90)
GFR calc non Af Amer: 64 mL/min — ABNORMAL LOW (ref >90)
Potassium: 3.9 mEq/L (ref 3.5–5.1)
Sodium: 139 mEq/L (ref 135–145)

## 2010-11-19 LAB — CBC
HCT: 23.6 % — ABNORMAL LOW (ref 39.0–52.0)
Hemoglobin: 8.1 g/dL — ABNORMAL LOW (ref 13.0–17.0)
Hemoglobin: 7.6 g/dL — ABNORMAL LOW (ref 13.0–17.0)
MCV: 76.6 fL — ABNORMAL LOW (ref 78.0–100.0)
MCV: 76.9 fL — ABNORMAL LOW (ref 78.0–100.0)
Platelets: 90 10*3/uL — ABNORMAL LOW (ref 150–400)
Platelets: 79 10*3/uL — ABNORMAL LOW (ref 150–400)
RBC: 3.21 MIL/uL — ABNORMAL LOW (ref 4.22–5.81)
RBC: 3.07 MIL/uL — ABNORMAL LOW (ref 4.22–5.81)
RDW: 20.4 % — ABNORMAL HIGH (ref 11.5–15.5)
WBC: 9.5 10*3/uL (ref 4.0–10.5)
WBC: 10 10*3/uL (ref 4.0–10.5)
WBC: 9.7 10*3/uL (ref 4.0–10.5)

## 2010-11-19 LAB — GLUCOSE, CAPILLARY
Glucose-Capillary: 107 mg/dL — ABNORMAL HIGH (ref 70–99)
Glucose-Capillary: 124 mg/dL — ABNORMAL HIGH (ref 70–99)

## 2010-11-19 MED ORDER — AMLODIPINE BESYLATE 5 MG PO TABS
2.5000 mg | ORAL_TABLET | Freq: Every day | ORAL | Status: DC
  Administered 2010-11-19 – 2010-11-23 (×4): 2.5 mg via ORAL
  Filled 2010-11-19 (×4): qty 1

## 2010-11-19 MED ORDER — SODIUM CHLORIDE 0.9 % IJ SOLN
INTRAMUSCULAR | Status: AC
  Filled 2010-11-19: qty 10

## 2010-11-19 NOTE — Plan of Care (Signed)
Problem: Consults Goal: Diabetes Guidelines if Diabetic/Glucose > 140 If diabetic or lab glucose is > 140 mg/dl - Initiate Diabetes/Hyperglycemia Guidelines & Document Interventions  Outcome: Progressing SS Insulin

## 2010-11-19 NOTE — Plan of Care (Signed)
Problem: Phase I Progression Outcomes Goal: Hemodynamically stable Outcome: Progressing Tachy and bp elevates during bladder obstruction then returns to normal

## 2010-11-19 NOTE — Plan of Care (Signed)
Problem: Phase II Progression Outcomes Goal: Progress activity as tolerated unless otherwise ordered oob several times today, 2 bm's.  Up with assistance.  Patient tolerated well.

## 2010-11-19 NOTE — Progress Notes (Signed)
CBI CONTINUES WIDE OPEN. RN MUST STILL FREQUENTLY MANUALLY EVACUATED LARGE CLOTS .

## 2010-11-19 NOTE — Progress Notes (Signed)
NAME:  WEST, ANCRUM NO.:  [id number]  MEDICAL RECORD NO.:  [id number]  LOCATION:  IC05                          FACILITY:  [facility]  PHYSICIAN:  Marissa Nestle, M.D.DATE OF BIRTH:  1940/09/13  DATE OF PROCEDURE:  11/18/2010 DATE OF DISCHARGE:                                PROGRESS NOTE   Clinically, he is doing well.  Has no urinary complaints except some suprapubic discomfort, which he gets after his catheter gets clogged up. The urine still looks a little bloody, but much clear.  So, my plan is to change his Foley catheter 22 x 3 Foley with CBI.  His hematocrit has come up to 20, but I have told the nurse that he should get blood so that the hematocrit is around 30.  He is already on the schedule for cysto possible TURBT.  I have spoken to the patient, I did not see anybody from the family, he understands, and I have written the preop orders already.     Marissa Nestle, M.D.     MIJ/MEDQ  D:  11/18/2010  T:  11/18/2010  Job:  [id number]

## 2010-11-19 NOTE — Progress Notes (Signed)
Subjective: The patient says he feels more comfortable now that the three-way irrigation Foley catheter was inserted. He still has some bladder discomfort, but refuses to ask for pain medication because he does not want to become addicted. He has less chest congestion.  Objective: Vital signs in last 24 hours: Filed Vitals:   11/18/10 2300 11/19/10 0000 11/19/10 0140 11/19/10 0733  BP: 164/71     Pulse: 92 83    Temp:  98.6 F (37 C)    TempSrc:  Axillary    Resp: 19 19    Height:      Weight:      SpO2: 99% 99% 97% 98%    Intake/Output Summary (Last 24 hours) at 11/19/10 0753 Last data filed at 11/18/10 2000  Gross per 24 hour  Intake 1351.25 ml  Output   2800 ml  Net -1448.75 ml    Weight change:   Exam: General: Lungs: Bilateral crackles and occasional wheezes. Heart: S1, S2, with no murmurs rubs or gallops. Abdomen:positive bowel sounds, mildly tender over his bladder, no hepatosplenomegaly. GU: Three-way Foley catheter has been inserted which was done yesterday. His urine is grossly bloody with evidence of blood clots in the Foley bag. Extremities: Trace of pedal edema.  Lab Results: Basic Metabolic Panel:  Basename 11/19/10 0204 11/18/10 0602  NA 139 139  K 3.9 3.8  CL 106 110  CO2 23 22  GLUCOSE 119* 135*  BUN 12 13  CREATININE 1.13 1.17  CALCIUM 8.8 8.1*  MG -- 2.2  PHOS -- --   Liver Function Tests:  [facility] 11/18/10 0602 11/17/10 1223  AST 11 11  ALT 7 8  ALKPHOS 63 65  BILITOT 0.5 0.3  PROT 5.8* 6.1  ALBUMIN 2.9* 3.1*    Basename 11/17/10 1223  LIPASE 24  AMYLASE --   No results found for this basename: AMMONIA:2 in the last 72 hours CBC:  Basename 11/19/10 0204 11/18/10 2015 11/17/10 1223  WBC 9.5 10.6* --  NEUTROABS -- -- 5.0  HGB 8.1* 8.2* --  HCT 24.6* 25.0* --  MCV 76.6* 76.9* --  PLT 90* 90* --   Cardiac Enzymes: No results found for this basename: CKTOTAL:3,CKMB:3,CKMBINDEX:3,TROPONINI:3 in the last 72 hours BNP: No  results found for this basename: POCBNP:3 in the last 72 hours D-Dimer: No results found for this basename: DDIMER:2 in the last 72 hours CBG:  Basename 11/18/10 2116 11/18/10 1635 11/18/10 1135 11/18/10 0721 11/17/10 2135 11/17/10 1644  GLUCAP 163* 121* 132* 138* 145* 143*   Hemoglobin A1C:  Basename 11/17/10 1354  HGBA1C 6.5*   Fasting Lipid Panel: No results found for this basename: CHOL,HDL,LDLCALC,TRIG,CHOLHDL,LDLDIRECT in the last 72 hours Thyroid Function Tests:  Basename 11/17/10 1354  TSH 1.179  T4TOTAL --  FREET4 1.03  T3FREE --  THYROIDAB --   Anemia Panel:  Basename 11/17/10 1354  VITAMINB12 269  FOLATE 11.9  FERRITIN 12*  TIBC Not calculated due to Iron <10.  IRON <10*  RETICCTPCT 4.2*   Urine Drug Screen:  Alcohol Level: No results found for this basename: ETH:2 in the last 72 hours Urinalysis:  Misc. Labs:   Micro: Recent Results (from the past 240 hour(s))  URINE CULTURE     Status: Normal   Collection Time   11/17/10 12:42 PM      Component Value Range Status Comment   Specimen Description URINE, CLEAN CATCH   Final    Special Requests NONE   Final    Setup Time [phone]  Final    Colony Count 20,OOO COLONIES/ML   Final    Culture     Final    Value: Multiple bacterial morphotypes present, none predominant. Suggest appropriate recollection if clinically indicated.   Report Status 11/18/2010 FINAL   Final   MRSA PCR SCREENING     Status: Normal   Collection Time   11/17/10  3:05 PM      Component Value Range Status Comment   MRSA by PCR NEGATIVE  NEGATIVE  Final     Studies/Results: US Renal  11/17/2010  *RADIOLOGY REPORT*  Clinical Data: Hematuria  RENAL/URINARY TRACT ULTRASOUND COMPLETE  Comparison:  None.  Findings:  Right Kidney:  10.2 cm length.  No hydronephrosis.  Normal cortex and echogenicity.  No focal abnormality.  Left Kidney:  11.7 cm length.  Normal cortex and echogenicity.  No hydronephrosis or focal abnormality.   Bladder:  Hyperechoic solid appearing dependent bladder mass noted measuring 4.7 x 3.5 x 7.0 cm.  This is nonspecific by ultrasound and could represent intraluminal bladder clot/thrombus versus a solid mass/neoplasm.  No abdominal free fluid demonstrated.  IMPRESSION: Negative for hydronephrosis or acute obstruction.  7 cm echogenic heterogeneous dependent bladder mass could represent clot/thrombus versus mass or neoplasm.  This warrants further evaluation.  Original Report Authenticated By: Jerilynn Mages. Daryll Brod, M.D.   Dg Chest Portable 1 View  11/18/2010  *RADIOLOGY REPORT*  Clinical Data: Shortness of breath.  Post transfusion.  PORTABLE CHEST - 1 VIEW  Comparison: None.  Findings: Cardiomegaly.  Pulmonary vascular congestion most notable centrally.  No segmental consolidation or gross pneumothorax. Tortuous aorta.  IMPRESSION: Cardiomegaly.  Pulmonary vascular congestion most notable centrally.  No segmental consolidation or gross pneumothorax.  Tortuous aorta.  Original Report Authenticated By: Doug Sou, M.D.    Medications: I have reviewed the patient's current medications.  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 He continues to have gross hematuria, with a recent history of clotting, causing obstruction in the Foley catheter and subsequently causing pain and discomfort. The three-way catheter has solved this problem. Dr. Silvano Rusk evaluation has been noted and appreciated. The plan is for a cystoscopy and a possible transurethral resection of the bladder tumor on Monday.  Acute/subacute blood loss anemia. He has been transfused a total of 4 units of packed red blood cells. His hemoglobin is now above 8.  His  anemia panel is significant for iron deficiency, which is not surprising.  Bilateral pulmonary crackles and wheezes, yesterday. He had vascular congestion on his chest x-ray, likely from IV fluid hydration and the blood transfusions. He was given 30 mg of Lasix and diuresis 12. His lungs are much clearer today. He also may have an element of COPD, given his smoking history. Xopenex nebulizers were started yesterday as well.  Thrombocytopenia. Etiology unclear at this time. It could be secondary to the malignancy. His platelet count is generally ranging from 85-90.  Lactic acidosis, resolved.  Type 2 diabetes mellitus. The patient had been treated with diet alone. His hemoglobin A1c is noted to be 6.5. For now, we will continue sliding scale NovoLog and carbohydrate modified diet.   Hypertension. After monitoring his blood pressure for the last couple days, I believe it is reasonable  to start an antihypertensive medication. Of note, his blood pressure has increased when his pain has increased due to the obstruction from the blood clots.  Tobacco abuse. Counseling ordered. Nicotine patch placed.        Plan:  1. Will start Norvasc and monitor accordingly.   Encouraged the patient to ask for analgesics when he is in pain. I informed him that the likelihood of addiction of opiates is low in the setting of acute pain.  Continue to monitor his CBC every 6 hours for the next 24 hours. If his hemoglobin falls below 8 in the setting of acute bleeding, will likely transfuse another unit. He now has 2 units of packed red blood cells on hold.  Cystoscopy and probable bladder tumor resection on Monday by Dr. Michela Pitcher.   LOS: 2 days   Isaac Hunter 11/19/2010, 7:53 AM

## 2010-11-20 LAB — GLUCOSE, CAPILLARY: Glucose-Capillary: 135 mg/dL — ABNORMAL HIGH (ref 70–99)

## 2010-11-20 LAB — CBC
MCH: 25.7 pg — ABNORMAL LOW (ref 26.0–34.0)
MCHC: 32.9 g/dL (ref 30.0–36.0)
MCV: 78.2 fL (ref 78.0–100.0)
Platelets: 81 10*3/uL — ABNORMAL LOW (ref 150–400)
RBC: 3.54 MIL/uL — ABNORMAL LOW (ref 4.22–5.81)

## 2010-11-20 LAB — PREPARE RBC (CROSSMATCH)

## 2010-11-20 MED ORDER — FUROSEMIDE 40 MG PO TABS
40.0000 mg | ORAL_TABLET | Freq: Once | ORAL | Status: AC
  Administered 2010-11-20: 40 mg via ORAL
  Filled 2010-11-20: qty 1

## 2010-11-20 NOTE — Progress Notes (Signed)
Chart reviewed. Nursing reports continuous bladder irrigation apparatus with frequent obstruction from clots, requiring frequent "de-clotting" efforts  Subjective: No new complaints. No shortness of breath. No chest pain.  Objective: Vital signs in last 24 hours: Filed Vitals:   11/20/10 0500 11/20/10 0600 11/20/10 0700 11/20/10 0800  BP: 139/69 160/77 186/88 134/109  Pulse: 80 83 104 87  Temp:    97.8 F (36.6 C)  TempSrc:    Oral  Resp: 34 15 14 15   Height:      Weight: 89 kg (196 lb 3.4 oz)     SpO2: 93% 100% 100% 100%    Intake/Output Summary (Last 24 hours) at 11/20/10 0840 Last data filed at 11/20/10 0600  Gross per 24 hour  Intake 1977.5 ml  Output      1 ml  Net 1976.5 ml    Weight change:   Exam: General: Comfortable Lungs: Clear to auscultation bilaterally without wheeze rhonchi or rales Heart: S1, S2, with no murmurs rubs or gallops. Abdomen:positive bowel sounds, mildly tender over his bladder, no hepatosplenomegaly. GU: Catheter draining tea-colored urine Extremities: Trace of pedal edema.  Lab Results: Basic Metabolic Panel:  Basename 11/19/10 0204 11/18/10 0602  NA 139 139  K 3.9 3.8  CL 106 110  CO2 23 22  GLUCOSE 119* 135*  BUN 12 13  CREATININE 1.13 1.17  CALCIUM 8.8 8.1*  MG -- 2.2  PHOS -- --   Liver Function Tests:  [facility] 11/18/10 0602 11/17/10 1223  AST 11 11  ALT 7 8  ALKPHOS 63 65  BILITOT 0.5 0.3  PROT 5.8* 6.1  ALBUMIN 2.9* 3.1*    Basename 11/17/10 1223  LIPASE 24  AMYLASE --   No results found for this basename: AMMONIA:2 in the last 72 hours CBC:  Basename 11/20/10 0517 11/19/10 1844 11/17/10 1223  WBC 9.2 9.7 --  NEUTROABS -- -- 5.0  HGB 9.1* 7.6* --  HCT 27.7* 23.6* --  MCV 78.2 76.9* --  PLT 81* 79* --   Cardiac Enzymes: No results found for this basename: CKTOTAL:3,CKMB:3,CKMBINDEX:3,TROPONINI:3 in the last 72 hours BNP: No results found for this basename: POCBNP:3 in the last 72 hours D-Dimer: No  results found for this basename: DDIMER:2 in the last 72 hours CBG:  Basename 11/20/10 0737 11/19/10 2142 11/19/10 1632 11/19/10 1156 11/19/10 0737 11/18/10 2116  GLUCAP 135* 147* 124* 107* 128* 163*   Hemoglobin A1C:  Basename 11/17/10 1354  HGBA1C 6.5*   Fasting Lipid Panel: No results found for this basename: CHOL,HDL,LDLCALC,TRIG,CHOLHDL,LDLDIRECT in the last 72 hours Thyroid Function Tests:  Basename 11/17/10 1354  TSH 1.179  T4TOTAL --  FREET4 1.03  T3FREE --  THYROIDAB --   Anemia Panel:  Basename 11/17/10 1354  VITAMINB12 269  FOLATE 11.9  FERRITIN 12*  TIBC Not calculated due to Iron <10.  IRON <10*  RETICCTPCT 4.2*   Urine Drug Screen:  Alcohol Level: No results found for this basename: ETH:2 in the last 72 hours Urinalysis:  Misc. Labs:   Micro: Recent Results (from the past 240 hour(s))  URINE CULTURE     Status: Normal   Collection Time   11/17/10 12:42 PM      Component Value Range Status Comment   Specimen Description URINE, CLEAN CATCH   Final    Special Requests NONE   Final    Setup Time [id number]   Final    Colony Count 20,OOO COLONIES/ML   Final    Culture  Final    Value: Multiple bacterial morphotypes present, none predominant. Suggest appropriate recollection if clinically indicated.   Report Status 11/18/2010 FINAL   Final   MRSA PCR SCREENING     Status: Normal   Collection Time   11/17/10  3:05 PM      Component Value Range Status Comment   MRSA by PCR NEGATIVE  NEGATIVE  Final   SURGICAL PCR SCREEN     Status: Normal   Collection Time   11/19/10  5:52 PM      Component Value Range Status Comment   MRSA, PCR NEGATIVE  NEGATIVE  Final    Staphylococcus aureus NEGATIVE  NEGATIVE  Final     Studies/Results: Dg Chest Portable 1 View  11/18/2010  *RADIOLOGY REPORT*  Clinical Data: Shortness of breath.  Post transfusion.  PORTABLE CHEST - 1 VIEW  Comparison: None.  Findings: Cardiomegaly.  Pulmonary vascular congestion  most notable centrally.  No segmental consolidation or gross pneumothorax. Tortuous aorta.  IMPRESSION: Cardiomegaly.  Pulmonary vascular congestion most notable centrally.  No segmental consolidation or gross pneumothorax.  Tortuous aorta.  Original Report Authenticated By: Doug Sou, M.D.    Medications: I have reviewed the patient's current medications.  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 his urine appears fairly clear today but he has frequent clots obstructing the catheter. Continue step down status. He is scheduled for cystoscopy tomorrow.   Acute/subacute blood loss anemia. Dr. Geraldo Pitter requests a hematocrit near 34 surgery. Will transfuse another unit of blood today and gave Lasix.  Appears euvolemic today.  Thrombocytopenia. Etiology unclear at this time. It could be secondary to the malignancy. His platelet count is generally ranging from 85-90.  Type 2 diabetes mellitus. The patient had been treated with diet alone. His hemoglobin A1c is noted to be 6.5. For now, we will continue sliding scale NovoLog and carbohydrate modified diet.   Hypertension. Better on Norvasc.  Tobacco abuse. Counseling ordered. Nicotine patch placed.  UTI: Continue ceftriaxone. Urine culture showed multiple bacterial morphotypes.    LOS: 3 days   Isaac Hunter 11/20/2010, 8:40 AM

## 2010-11-21 ENCOUNTER — Encounter (HOSPITAL_COMMUNITY): Payer: Self-pay | Admitting: Anesthesiology

## 2010-11-21 ENCOUNTER — Other Ambulatory Visit: Payer: Self-pay | Admitting: Urology

## 2010-11-21 ENCOUNTER — Encounter (HOSPITAL_COMMUNITY)
Admission: EM | Disposition: A | Discharge status: Home Health | Payer: Self-pay | Source: Home / Self Care | Attending: Internal Medicine

## 2010-11-21 ENCOUNTER — Inpatient Hospital Stay (HOSPITAL_COMMUNITY): Payer: Medicare Other | Admitting: Anesthesiology

## 2010-11-21 ENCOUNTER — Encounter (HOSPITAL_COMMUNITY): Payer: Self-pay | Admitting: *Deleted

## 2010-11-21 HISTORY — PX: CYSTOSCOPY: SHX5120

## 2010-11-21 HISTORY — PX: TRANSURETHRAL RESECTION OF BLADDER TUMOR: SHX2575

## 2010-11-21 LAB — TYPE AND SCREEN
ABO/RH(D): B NEG
Antibody Screen: NEGATIVE
Unit division: 0
Unit division: 0
Unit division: 0
Unit division: 0

## 2010-11-21 LAB — PREPARE RBC (CROSSMATCH)

## 2010-11-21 LAB — CBC
HCT: 29.7 % — ABNORMAL LOW (ref 39.0–52.0)
MCHC: 32.7 g/dL (ref 30.0–36.0)
MCV: 79.2 fL (ref 78.0–100.0)
Platelets: 80 10*3/uL — ABNORMAL LOW (ref 150–400)
RDW: 20.1 % — ABNORMAL HIGH (ref 11.5–15.5)
WBC: 8.7 10*3/uL (ref 4.0–10.5)

## 2010-11-21 LAB — BASIC METABOLIC PANEL
BUN: 15 mg/dL (ref 6–23)
CO2: 23 mEq/L (ref 19–32)
Calcium: 8.8 mg/dL (ref 8.4–10.5)
Creatinine, Ser: 1.15 mg/dL (ref 0.50–1.35)
GFR calc Af Amer: 72 mL/min — ABNORMAL LOW (ref >90)

## 2010-11-21 LAB — GLUCOSE, CAPILLARY

## 2010-11-21 LAB — HEMOGLOBIN AND HEMATOCRIT, BLOOD: HCT: 26.8 % — ABNORMAL LOW (ref 39.0–52.0)

## 2010-11-21 SURGERY — CYSTOSCOPY
Anesthesia: Spinal | Site: Bladder | Wound class: Clean Contaminated

## 2010-11-21 MED ORDER — FENTANYL CITRATE 0.05 MG/ML IJ SOLN: 25.0000 ug | INTRAMUSCULAR | Status: DC | PRN

## 2010-11-21 MED ORDER — STERILE WATER FOR IRRIGATION IR SOLN
Status: DC | PRN
  Administered 2010-11-21: 1000 mL

## 2010-11-21 MED ORDER — MIDAZOLAM HCL 2 MG/2ML IJ SOLN
1.0000 mg | INTRAMUSCULAR | Status: DC | PRN
  Administered 2010-11-21: 2 mg via INTRAVENOUS

## 2010-11-21 MED ORDER — PROPOFOL 10 MG/ML IV EMUL
INTRAVENOUS | Status: AC
  Filled 2010-11-21: qty 20

## 2010-11-21 MED ORDER — ONDANSETRON HCL 4 MG/2ML IJ SOLN: 4.0000 mg | Freq: Once | INTRAMUSCULAR | Status: DC | PRN

## 2010-11-21 MED ORDER — BUPIVACAINE HCL 0.75 % IJ SOLN
INTRAMUSCULAR | Status: DC | PRN
  Administered 2010-11-21: 15 mg via INTRATHECAL

## 2010-11-21 MED ORDER — LACTATED RINGERS IV SOLN
INTRAVENOUS | Status: DC
  Administered 2010-11-21: 13:00:00 via INTRAVENOUS

## 2010-11-21 MED ORDER — FENTANYL CITRATE 0.05 MG/ML IJ SOLN
INTRAMUSCULAR | Status: AC
  Filled 2010-11-21: qty 2

## 2010-11-21 MED ORDER — BUPIVACAINE IN DEXTROSE 0.75-8.25 % IT SOLN
INTRATHECAL | Status: AC
  Filled 2010-11-21: qty 2

## 2010-11-21 MED ORDER — FENTANYL CITRATE 0.05 MG/ML IJ SOLN
INTRAMUSCULAR | Status: DC | PRN
  Administered 2010-11-21: 20 ug via INTRATHECAL
  Administered 2010-11-21: 50 ug via INTRAVENOUS

## 2010-11-21 MED ORDER — PROPOFOL 10 MG/ML IV EMUL
INTRAVENOUS | Status: DC | PRN
  Administered 2010-11-21: 25 ug/kg/min via INTRAVENOUS

## 2010-11-21 MED ORDER — GLYCINE 1.5 % IR SOLN
Status: DC | PRN
  Administered 2010-11-21 (×7): 3000 mL

## 2010-11-21 MED ORDER — MIDAZOLAM HCL 2 MG/2ML IJ SOLN
INTRAMUSCULAR | Status: AC
  Administered 2010-11-21: 2 mg via INTRAVENOUS
  Filled 2010-11-21: qty 2

## 2010-11-21 MED ORDER — BUPIVACAINE HCL (PF) 0.5 % IJ SOLN: INTRAMUSCULAR | Status: DC | PRN

## 2010-11-21 MED ORDER — SODIUM CHLORIDE 0.9 % IJ SOLN
INTRAMUSCULAR | Status: AC
  Filled 2010-11-21: qty 10

## 2010-11-21 MED ORDER — LIDOCAINE HCL (PF) 1 % IJ SOLN
INTRAMUSCULAR | Status: AC
  Filled 2010-11-21: qty 5

## 2010-11-21 MED ORDER — EPHEDRINE SULFATE 50 MG/ML IJ SOLN
INTRAMUSCULAR | Status: DC | PRN
  Administered 2010-11-21 (×2): 5 mg via INTRAVENOUS
  Administered 2010-11-21 (×2): 10 mg via INTRAVENOUS

## 2010-11-21 MED ORDER — EPHEDRINE SULFATE 50 MG/ML IJ SOLN
INTRAMUSCULAR | Status: AC
  Filled 2010-11-21: qty 1

## 2010-11-21 SURGICAL SUPPLY — 34 items
BAG DECANTER FOR FLEXI CONT (MISCELLANEOUS) ×2 IMPLANT
BAG DRAIN URO TABLE W/ADPT NS (DRAPE) ×2 IMPLANT
BAG DRN 8 ADPR NS SKTRN CSTL (DRAPE) ×1
BAG HAMPER (MISCELLANEOUS) ×2 IMPLANT
BAG URINE DRAINAGE (UROLOGICAL SUPPLIES) ×2 IMPLANT
CABLE HI FREQUENCY MONOPOLAR (ELECTROSURGICAL) ×2 IMPLANT
CATH FOLEY 2WAY SLVR  5CC 20FR (CATHETERS) ×1
CATH FOLEY 2WAY SLVR 5CC 20FR (CATHETERS) ×1 IMPLANT
CATH FOLEY 3WAY 30CC 22F (CATHETERS) ×1 IMPLANT
CLOTH BEACON ORANGE TIMEOUT ST (SAFETY) ×2 IMPLANT
CONNECTOR 5 IN 1 STRAIGHT STRL (MISCELLANEOUS) ×2 IMPLANT
ELECT CUT LOOP C-MAX 27FR .012 (CUTTING LOOP) ×2
ELECTRODE CUT LP CMX 27FR .012 (CUTTING LOOP) ×1 IMPLANT
GLOVE BIO SURGEON STRL SZ7 (GLOVE) ×2 IMPLANT
GLOVE BIOGEL PI IND STRL 7.0 (GLOVE) IMPLANT
GLOVE BIOGEL PI IND STRL 7.5 (GLOVE) IMPLANT
GLOVE BIOGEL PI INDICATOR 7.0 (GLOVE) ×1
GLOVE BIOGEL PI INDICATOR 7.5 (GLOVE) ×1
GLOVE ECLIPSE 6.5 STRL STRAW (GLOVE) ×1 IMPLANT
GLOVE ECLIPSE 7.0 STRL STRAW (GLOVE) ×1 IMPLANT
GLYCINE 1.5% IRRIG UROMATIC (IV SOLUTION) ×11 IMPLANT
GOWN BRE IMP SLV AUR XL STRL (GOWN DISPOSABLE) ×2 IMPLANT
IV NS IRRIG 3000ML ARTHROMATIC (IV SOLUTION) ×2 IMPLANT
KIT ROOM TURNOVER AP CYSTO (KITS) ×2 IMPLANT
LASER FIBER DISP 1000U (UROLOGICAL SUPPLIES) IMPLANT
MANIFOLD NEPTUNE II (INSTRUMENTS) ×2 IMPLANT
PACK CYSTO (CUSTOM PROCEDURE TRAY) ×2 IMPLANT
PAD ARMBOARD 7.5X6 YLW CONV (MISCELLANEOUS) ×2 IMPLANT
SET IRRIGATING DISP (SET/KITS/TRAYS/PACK) ×2 IMPLANT
SYR 30ML LL (SYRINGE) ×2 IMPLANT
SYRINGE IRR TOOMEY STRL 70CC (SYRINGE) IMPLANT
TOWEL OR 17X26 4PK STRL BLUE (TOWEL DISPOSABLE) ×2 IMPLANT
WATER STERILE IRR 1000ML POUR (IV SOLUTION) ×2 IMPLANT
YANKAUER SUCT BULB TIP 10FT TU (MISCELLANEOUS) ×2 IMPLANT

## 2010-11-21 NOTE — Progress Notes (Signed)
[phone] [id number]

## 2010-11-21 NOTE — Brief Op Note (Signed)
11/17/2010 - 11/21/2010  3:01 PM  PATIENT:  Isaac Hunter  70 y.o. male  PRE-OPERATIVE DIAGNOSIS:  gross hematuria  POST-OPERATIVE DIAGNOSIS:  bladder tumor  PROCEDURE:  Procedure(s): CYSTOSCOPY evacuation of blood clots TRANSURETHRAL RESECTION OF BLADDER TUMOR (TURBT)  SURGEON:  Surgeon(s): Marissa Nestle  PHYSICIAN ASSISTANT:   ASSISTANTS: none   ANESTHESIA:   spinal  EBL:  Total I/O In: 900 [I.V.:900] Out: 900 [Urine:800; Blood:100]  BLOOD ADMINISTERED:none  DRAINS: Urinary Catheter (Foley)   LOCAL MEDICATIONS USED:  NONE  SPECIMEN:  Scraping  DISPOSITION OF SPECIMEN:  PATHOLOGY  COUNTS:  YES  TOURNIQUET:  * No tourniquets in log *  DICTATION: .Other Dictation: Dictation Number [id number]  PLAN OF CARE: transfer to icu  PATIENT DISPOSITION:  ICU - extubated and stable.   Delay start of Pharmacological VTE agent (>24hrs) due to surgical blood loss or risk of bleeding  std

## 2010-11-21 NOTE — Op Note (Signed)
[phone] [id number]

## 2010-11-21 NOTE — Anesthesia Preprocedure Evaluation (Addendum)
Anesthesia Evaluation  Name, MR# and DOB Patient awake  General Assessment Comment  Reviewed: Allergy & Precautions, H&P , NPO status , Patient's Chart, lab work & pertinent test results  Airway Mallampati: II TM Distance: <3 FB Neck ROM: Full    Dental  (+) Poor Dentition, Missing, Loose, Chipped and Dental Advisory Given   Pulmonary Current Smoker    Pulmonary exam normal       Cardiovascular hypertension, Pt. on medications + CAD Regular     Neuro/Psych    GI/Hepatic   Endo/Other  Diabetes mellitus-, Poorly Controlled, Type 2  Renal/GU      Musculoskeletal   Abdominal   Peds  Hematology Anemia from acute blood loss due to gross hematuria. Transfused 3 PRBC.   Anesthesia Other Findings   Reproductive/Obstetrics                          Anesthesia Physical Anesthesia Plan  ASA: III  Anesthesia Plan: Spinal   Post-op Pain Management:    Induction:   Airway Management Planned: Nasal Cannula  Additional Equipment:   Intra-op Plan:   Post-operative Plan:   Informed Consent: I have reviewed the patients History and Physical, chart, labs and discussed the procedure including the risks, benefits and alternatives for the proposed anesthesia with the patient or authorized representative who has indicated his/her understanding and acceptance.     Plan Discussed with:   Anesthesia Plan Comments:         Anesthesia Quick Evaluation

## 2010-11-21 NOTE — Anesthesia Procedure Notes (Addendum)
Spinal Block  Patient location during procedure: OR Start time: 11/21/2010 1:44 PM Staffing CRNA/Resident: Tressie Stalker Preanesthetic Checklist Completed: patient identified, site marked, surgical consent, pre-op evaluation, timeout performed, IV checked, risks and benefits discussed and monitors and equipment checked Spinal Block Patient position: left lateral decubitus Prep: Betadine Patient monitoring: heart rate, cardiac monitor, continuous pulse ox and blood pressure Approach: midline Location: L4-5 Injection technique: single-shot Needle Needle type: Spinocan  Needle gauge: 22 G Needle length: 9 cm Assessment Sensory level: T6 Additional Notes 1344  Marcaine .75%, 2CC, epi 1;100 .1CC AND FENTANYL 48mcg injected. Tray # [id number], exp date 09/2011

## 2010-11-21 NOTE — Anesthesia Postprocedure Evaluation (Addendum)
  Anesthesia Post-op Note  Patient: Isaac Hunter  Procedure(s) Performed:  CYSTOSCOPY; TRANSURETHRAL RESECTION OF BLADDER TUMOR (TURBT) - evacuation of clots  Patient Location: PACU  Anesthesia Type: Regional  Level of Consciousness: awake, alert  and oriented  Airway and Oxygen Therapy: Patient Spontanous Breathing  Post-op Pain: none  Post-op Assessment: Post-op Vital signs reviewed, Patient's Cardiovascular Status Stable and Respiratory Function Stable  Post-op Vital Signs: Reviewed  Complications: No apparent anesthesia complications [id number]  Patient alert and oriented.  Denies HA, back pain and states sensation is normal in lower extremities.  VSS.  No apparent anesthesia complications.

## 2010-11-21 NOTE — Progress Notes (Signed)
Nursing reports continuous bladder irrigation apparatus with frequent obstruction from clots, requiring frequent "de-clotting" efforts  Subjective: Complains of bladder discomfort.. No shortness of breath. No chest pain.  Objective: Vital signs in last 24 hours: Filed Vitals:   11/21/10 0600 11/21/10 0719 11/21/10 0800 11/21/10 0900  BP: 170/80  167/114 160/66  Pulse:   77 84  Temp:   98.3 F (36.8 C)   TempSrc:   Oral   Resp: 16  17 13   Height:      Weight:      SpO2:  96% 99% 100%    Intake/Output Summary (Last 24 hours) at 11/21/10 0945 Last data filed at 11/20/10 2130  Gross per 24 hour  Intake   1000 ml  Output      0 ml  Net   1000 ml    Weight change: 0.5 kg (1 lb 1.6 oz)  Exam:  Nurses are currently trying to get his catheter running properly  General: Uncomfortable. Lungs: Clear to auscultation bilaterally without wheeze rhonchi or rales Heart: S1, S2, with no murmurs rubs or gallops. Abdomen:positive bowel sounds, mildly tender over his bladder, no hepatosplenomegaly. GU: Catheter draining tea-colored urine with clots. Extremities: Trace of pedal edema.  Lab Results: Basic Metabolic Panel:  Basename 11/21/10 0358 11/19/10 0204  NA 138 139  K 3.8 3.9  CL 106 106  CO2 23 23  GLUCOSE 120* 119*  BUN 15 12  CREATININE 1.15 1.13  CALCIUM 8.8 8.8  MG -- --  PHOS -- --   Liver Function Tests: No results found for this basename: AST:2,ALT:2,ALKPHOS:2,BILITOT:2,PROT:2,ALBUMIN:2 in the last 72 hours No results found for this basename: LIPASE:2,AMYLASE:2 in the last 72 hours No results found for this basename: AMMONIA:2 in the last 72 hours CBC:  Basename 11/21/10 0358 11/20/10 0517  WBC 8.7 9.2  NEUTROABS -- --  HGB 9.7* 9.1*  HCT 29.7* 27.7*  MCV 79.2 78.2  PLT 80* 81*   Cardiac Enzymes: No results found for this basename: CKTOTAL:3,CKMB:3,CKMBINDEX:3,TROPONINI:3 in the last 72 hours BNP: No results found for this basename: POCBNP:3 in the last  72 hours D-Dimer: No results found for this basename: DDIMER:2 in the last 72 hours CBG:  Basename 11/21/10 0746 11/20/10 2159 11/20/10 1621 11/20/10 1201 11/20/10 0737 11/19/10 2142  GLUCAP 136* 139* 112* 128* 135* 147*   Hemoglobin A1C: No results found for this basename: HGBA1C in the last 72 hours Fasting Lipid Panel: No results found for this basename: CHOL,HDL,LDLCALC,TRIG,CHOLHDL,LDLDIRECT in the last 72 hours Thyroid Function Tests: No results found for this basename: TSH,T4TOTAL,FREET4,T3FREE,THYROIDAB in the last 72 hours Anemia Panel: No results found for this basename: VITAMINB12,FOLATE,FERRITIN,TIBC,IRON,RETICCTPCT in the last 72 hours Urine Drug Screen:  Alcohol Level: No results found for this basename: ETH:2 in the last 72 hours Urinalysis:  Misc. Labs:   Micro: Recent Results (from the past 240 hour(s))  URINE CULTURE     Status: Normal   Collection Time   11/17/10 12:42 PM      Component Value Range Status Comment   Specimen Description URINE, CLEAN CATCH   Final    Special Requests NONE   Final    Setup Time [id number]   Final    Colony Count 20,OOO COLONIES/ML   Final    Culture     Final    Value: Multiple bacterial morphotypes present, none predominant. Suggest appropriate recollection if clinically indicated.   Report Status 11/18/2010 FINAL   Final   MRSA PCR SCREENING  Status: Normal   Collection Time   11/17/10  3:05 PM      Component Value Range Status Comment   MRSA by PCR NEGATIVE  NEGATIVE  Final   SURGICAL PCR SCREEN     Status: Normal   Collection Time   11/19/10  5:52 PM      Component Value Range Status Comment   MRSA, PCR NEGATIVE  NEGATIVE  Final    Staphylococcus aureus NEGATIVE  NEGATIVE  Final     Studies/Results: No results found.  Medications: I have reviewed the patient's current medications.  Assessment: Principal Problem:  *Gross hematuria Active Problems:  Anemia due to blood loss  Thrombocytopenia  Sinus  tachycardia  Lactic acidosis  HTN (hypertension)  Bladder mass  DM type 2 (diabetes mellitus, type 2)  Tobacco abuse  For surgery today. Hemoglobin is acceptable. Elevated blood pressure currently with tachycardia related to discomfort from obstructed catheter.   LOS: 4 days   Jeromiah Ohalloran L 11/21/2010, 9:45 AM

## 2010-11-21 NOTE — Transfer of Care (Signed)
Immediate Anesthesia Transfer of Care Note  Patient: Isaac Hunter  Procedure(s) Performed:  CYSTOSCOPY; TRANSURETHRAL RESECTION OF BLADDER TUMOR (TURBT) - Possible TURBT  Patient Location: PACU  Anesthesia Type: SAB  Level of Consciousness: awake  Airway & Oxygen Therapy: Patient Spontanous Breathing and non-rebreather face mask  Post-op Assessment: Report given to PACU RN, Post -op Vital signs reviewed and stable. SAB Level  T 6  Post vital signs: Reviewed and stable  Complications: No apparent anesthesia complications

## 2010-11-22 LAB — CBC
HCT: 30.4 % — ABNORMAL LOW (ref 39.0–52.0)
Hemoglobin: 10 g/dL — ABNORMAL LOW (ref 13.0–17.0)
MCH: 26.2 pg (ref 26.0–34.0)
MCHC: 32.9 g/dL (ref 30.0–36.0)
MCV: 79.8 fL (ref 78.0–100.0)
Platelets: 73 10*3/uL — ABNORMAL LOW (ref 150–400)
RBC: 3.81 MIL/uL — ABNORMAL LOW (ref 4.22–5.81)
RDW: 19.8 % — ABNORMAL HIGH (ref 11.5–15.5)
WBC: 9.5 10*3/uL (ref 4.0–10.5)

## 2010-11-22 LAB — GLUCOSE, CAPILLARY: Glucose-Capillary: 126 mg/dL — ABNORMAL HIGH (ref 70–99)

## 2010-11-22 LAB — BASIC METABOLIC PANEL WITH GFR
BUN: 14 mg/dL (ref 6–23)
CO2: 23 meq/L (ref 19–32)
Calcium: 8.8 mg/dL (ref 8.4–10.5)
Chloride: 106 meq/L (ref 96–112)
Creatinine, Ser: 1.15 mg/dL (ref 0.50–1.35)
GFR calc Af Amer: 72 mL/min — ABNORMAL LOW
GFR calc non Af Amer: 62 mL/min — ABNORMAL LOW
Glucose, Bld: 107 mg/dL — ABNORMAL HIGH (ref 70–99)
Potassium: 3.8 meq/L (ref 3.5–5.1)
Sodium: 138 meq/L (ref 135–145)

## 2010-11-22 NOTE — Progress Notes (Signed)
[id number]

## 2010-11-22 NOTE — Progress Notes (Signed)
Encounter addended by: Tressie Stalker on: 11/22/2010  8:17 AM<BR>     Documentation filed: Notes Section

## 2010-11-22 NOTE — Op Note (Signed)
NAME:  Isaac Hunter, Isaac Hunter NO.:  [id number]  MEDICAL RECORD NO.:  [id number]  LOCATION:  IC05                          FACILITY:  [facility]  PHYSICIAN:  Marissa Nestle, M.D.DATE OF BIRTH:  02/13/1940  DATE OF PROCEDURE: DATE OF DISCHARGE:                              OPERATIVE REPORT   PREOPERATIVE DIAGNOSIS:  Gross hematuria, possible bladder tumor.  POSTOPERATIVE DIAGNOSIS:  Bladder tumor.  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  ANESTHESIA:  Spinal.  PROCEDURE IN DETAIL:  The patient under spinal anesthesia in lithotomy position after usual prep and drape, #28 Iglesias resectoscope was introduced into the bladder.  It was inspected, and the bladder was full with blood clots. They were evacuated with the help of Ellik evacuator. Now, I can see in the bladder there is a large solid pus plus palpably tumor located on the left bladder wall just above the left ureteral orifice.  There is a smaller diverticulum behind the tumor in the left bladder wall.  Rest of the bladder grossly looks normal, so I proceeded to resect the tumor starting from the surface of the tumor.  I completely resected this tumor down to the base.  The bleeders were coagulated.  Chips were evacuated.  Rest of the bladder was inspected, looks normal.  The prostatic urethra does not show any bladder neck obstruction.  Resectoscope was removed and 22 three-way Foley catheter inserted.  CBI started which was clear then I did a rectal exam. Prostate is 1.5+ smooth and firm.  The patient left the operating room in satisfactory condition.     Marissa Nestle, M.D.     MIJ/MEDQ  D:  11/21/2010  T:  11/22/2010  Job:  [id number]

## 2010-11-22 NOTE — Progress Notes (Signed)
NAME:  Isaac Hunter, CAPPELLETTI NO.:  [id number]  MEDICAL RECORD NO.:  [id number]  LOCATION:  IC05                          FACILITY:  [facility]  PHYSICIAN:  Marissa Nestle, M.D.DATE OF BIRTH:  1940/12/17  DATE OF PROCEDURE:  11/21/2010 DATE OF DISCHARGE:                                PROGRESS NOTE   HISTORY OF PRESENT ILLNESS:  [name] came with gross hematuria for a several month history.  His hemoglobin was low when he came in, so we have given him 3 units of blood slowly over the last few days.  His hematocrit has come up to around 30%.  He is stable.  His blood pressure is 134/100, temperature is 97.8, and O2 saturation is 100%.  Intake and output satisfactory.  As I have mentioned in my previous notes that he has a bladder mass very suspicious of bladder carcinoma, so I am going to scope him and I have spoken to his family, his son and wife that if I see there is a tumor I am going to try to resect obviously.  The purpose of this resection will be to see, if possibly I can remove it completely or basically it is for a biopsy purpose.  There is always a complication of bladder perforation injury to the bowel and mainly 2 open surgery.  LABORATORY DATA:  BMET couple of days ago, sodium is 139, potassium is 3.9, chloride 106, CO2 is 23, glucose 119.  His WBC count yesterday is 9.2, hemoglobin is 9.1, and hematocrit was 27.7.  The patient basically is stable, doing well, so we are going to go ahead and cystoscope him with possibility of TURBT today.     Marissa Nestle, M.D.     MIJ/MEDQ  D:  11/21/2010  T:  11/22/2010  Job:  [id number]

## 2010-11-22 NOTE — Progress Notes (Signed)
Per nursing, no catheter obstruction today. Still some clots though.    Nursing reports continuous bladder irrigation apparatus with frequent obstruction from clots, requiring frequent "de-clotting" efforts  Subjective: Complains of bladder discomfort.. No shortness of breath. No chest pain.  Objective: Vital signs in last 24 hours: Filed Vitals:   11/22/10 1000 11/22/10 1100 11/22/10 1200 11/22/10 1347  BP: 124/61 127/81 136/67   Pulse: 69 68 72   Temp:   97.6 F (36.4 C)   TempSrc:   Oral   Resp: 26 17 18    Height:      Weight:      SpO2: 98% 100% 100% 100%    Intake/Output Summary (Last 24 hours) at 11/22/10 1527 Last data filed at 11/22/10 1200  Gross per 24 hour  Intake   2280 ml  Output   1400 ml  Net    880 ml    Weight change: -2.818 kg (-6 lb 3.4 oz)  Exam:  Nurses are currently trying to get his catheter running properly  General: Uncomfortable. Lungs: Clear to auscultation bilaterally without wheeze rhonchi or rales Heart: S1, S2, with no murmurs rubs or gallops. Abdomen:positive bowel sounds, mildly tender over his bladder, no hepatosplenomegaly. GU: Catheter draining tea-colored urine with clots. Extremities: Trace of pedal edema.  Lab Results: Basic Metabolic Panel:  Basename 11/22/10 0517 11/21/10 0358  NA 138 138  K 3.8 3.8  CL 106 106  CO2 23 23  GLUCOSE 107* 120*  BUN 14 15  CREATININE 1.15 1.15  CALCIUM 8.8 8.8  MG -- --  PHOS -- --   Liver Function Tests: No results found for this basename: AST:2,ALT:2,ALKPHOS:2,BILITOT:2,PROT:2,ALBUMIN:2 in the last 72 hours No results found for this basename: LIPASE:2,AMYLASE:2 in the last 72 hours No results found for this basename: AMMONIA:2 in the last 72 hours CBC:  Basename 11/22/10 0517 11/21/10 1510 11/21/10 0358  WBC 9.5 -- 8.7  NEUTROABS -- -- --  HGB 10.0* 8.8* --  HCT 30.4* 26.8* --  MCV 79.8 -- 79.2  PLT 73* -- 80*   Cardiac Enzymes: No results found for this basename:  CKTOTAL:3,CKMB:3,CKMBINDEX:3,TROPONINI:3 in the last 72 hours BNP: No results found for this basename: POCBNP:3 in the last 72 hours D-Dimer: No results found for this basename: DDIMER:2 in the last 72 hours CBG:  Basename 11/22/10 1120 11/22/10 0740 11/21/10 2338 11/21/10 1717 11/21/10 1209 11/21/10 0746  GLUCAP 126* 110* 107* 135* 125* 136*   Hemoglobin A1C: No results found for this basename: HGBA1C in the last 72 hours Fasting Lipid Panel: No results found for this basename: CHOL,HDL,LDLCALC,TRIG,CHOLHDL,LDLDIRECT in the last 72 hours Thyroid Function Tests: No results found for this basename: TSH,T4TOTAL,FREET4,T3FREE,THYROIDAB in the last 72 hours Anemia Panel: No results found for this basename: VITAMINB12,FOLATE,FERRITIN,TIBC,IRON,RETICCTPCT in the last 72 hours Urine Drug Screen:  Alcohol Level: No results found for this basename: ETH:2 in the last 72 hours Urinalysis:  Misc. Labs:   Micro: Recent Results (from the past 240 hour(s))  URINE CULTURE     Status: Normal   Collection Time   11/17/10 12:42 PM      Component Value Range Status Comment   Specimen Description URINE, CLEAN CATCH   Final    Special Requests NONE   Final    Setup Time [id number]   Final    Colony Count 20,OOO COLONIES/ML   Final    Culture     Final    Value: Multiple bacterial morphotypes present, none predominant. Suggest appropriate recollection if  clinically indicated.   Report Status 11/18/2010 FINAL   Final   MRSA PCR SCREENING     Status: Normal   Collection Time   11/17/10  3:05 PM      Component Value Range Status Comment   MRSA by PCR NEGATIVE  NEGATIVE  Final   SURGICAL PCR SCREEN     Status: Normal   Collection Time   11/19/10  5:52 PM      Component Value Range Status Comment   MRSA, PCR NEGATIVE  NEGATIVE  Final    Staphylococcus aureus NEGATIVE  NEGATIVE  Final     Studies/Results: No results found.  Medications: I have reviewed the patient's current  medications.  Assessment: Principal Problem:  *Gross hematuria Active Problems:  Anemia due to blood loss  Thrombocytopenia  Sinus tachycardia  Lactic acidosis  HTN (hypertension)  Bladder mass  DM type 2 (diabetes mellitus, type 2)  Tobacco abuse  Medical issues stable. Management per Dr. Geraldo Pitter.   LOS: 5 days   Roye Gustafson L 11/22/2010, 3:27 PM

## 2010-11-23 LAB — CBC
HCT: 30.8 % — ABNORMAL LOW (ref 39.0–52.0)
MCV: 80 fL (ref 78.0–100.0)
RBC: 3.85 MIL/uL — ABNORMAL LOW (ref 4.22–5.81)
WBC: 8.1 10*3/uL (ref 4.0–10.5)

## 2010-11-23 LAB — GLUCOSE, CAPILLARY: Glucose-Capillary: 122 mg/dL — ABNORMAL HIGH (ref 70–99)

## 2010-11-23 LAB — BASIC METABOLIC PANEL
BUN: 15 mg/dL (ref 6–23)
CO2: 24 mEq/L (ref 19–32)
Chloride: 104 mEq/L (ref 96–112)
Creatinine, Ser: 1.29 mg/dL (ref 0.50–1.35)
Glucose, Bld: 105 mg/dL — ABNORMAL HIGH (ref 70–99)

## 2010-11-23 MED ORDER — ACETAMINOPHEN 325 MG PO TABS: 650.0000 mg | ORAL_TABLET | Freq: Four times a day (QID) | ORAL | Status: AC | PRN

## 2010-11-23 MED ORDER — HYDROCODONE-ACETAMINOPHEN 5-325 MG PO TABS: 1.0000 | ORAL_TABLET | ORAL | Status: DC | PRN

## 2010-11-23 MED ORDER — HYDROCODONE-ACETAMINOPHEN 5-325 MG PO TABS: 1.0000 | ORAL_TABLET | ORAL | Status: AC | PRN

## 2010-11-23 MED ORDER — AMLODIPINE BESYLATE 2.5 MG PO TABS: 5.0000 mg | ORAL_TABLET | Freq: Every day | ORAL | Status: DC

## 2010-11-23 MED ORDER — AMLODIPINE BESYLATE 5 MG PO TABS: 5.0000 mg | ORAL_TABLET | Freq: Every day | ORAL | Status: DC

## 2010-11-23 NOTE — Progress Notes (Signed)
NAME:  LYNNOX, BOYKO                 ACCOUNT NO.:  MEDICAL RECORD NO.:  [id number]  LOCATION:                                 FACILITY:  PHYSICIAN:  Marissa Nestle, M.D.    DATE OF BIRTH:  DATE OF PROCEDURE: DATE OF DISCHARGE:                                PROGRESS NOTE   Mr. Ryder's general condition is good.  On his first postop day, urine looks slightly brownish.  He probably has a blood clot in his bladder.  His hematocrit is 30.4.  Abdomen is soft and nondistended.  PLAN:  He can be discharged from ICU, probably go home tomorrow with Foley catheter which I will remove in the office on Monday and I do not need to give him any medicine to take home and he can be out of bed today.  I told the nurse to irrigate his bladder with a couple of 100 mL of normal saline.  He can be out of bed and after that he can reduce transfer to floor.     Marissa Nestle, M.D.     MIJ/MEDQ  D:  11/22/2010  T:  11/23/2010  Job:  [id number]

## 2010-11-23 NOTE — Discharge Summary (Signed)
Physician Discharge Summary  Patient ID: Isaac Hunter MRN: [id number] DOB/AGE: 1940-08-27 70 y.o.  Admit date: 11/17/2010 Discharge date: 11/23/2010  Discharge Diagnoses:  Principal Problem:  *Gross hematuria Active Problems:  Anemia due to blood loss  Thrombocytopenia  Sinus tachycardia  Lactic acidosis  HTN (hypertension)  Bladder mass  DM type 2 (diabetes mellitus, type 2)  Tobacco abuse   Current Discharge Medication List    START taking these medications   Details  acetaminophen (TYLENOL) 325 MG tablet Take 2 tablets (650 mg total) by mouth every 6 (six) hours as needed for pain or fever. Qty: 30 tablet    amLODipine (NORVASC) 5 MG tablet Take 1 tablet (5 mg total) by mouth daily. Qty: 30 tablet, Refills: 0    HYDROcodone-acetaminophen (NORCO) 5-325 MG per tablet Take 1 tablet by mouth every 4 (four) hours as needed for pain. Qty: 30 tablet, Refills: 0        Discharge Orders    Future Orders Please Complete By Expires   Diet - low sodium heart healthy      Diet Carb Modified      Increase activity slowly      Discharge instructions      Comments:   Catheter stays in until removed by Dr. Michela Pitcher      Follow-up Information    Follow up with [facility]-DR HOLDER AT 10:15 AM in 2 weeks.   Contact information:   DR HOLDER AT 10:15AM      Follow up with Marissa Nestle [facility] OCT 22,2012 AT 10:45)    Contact information:   [address] [place] [id number]         tests pending at the time of discharge include bladder mass pathology report.  Disposition:  home with home health nursing  Discharged Condition: Stable  Consults:   Javaid  Procedures: Cystoscopy, evacuation of blood clots, transurethral resection of bladder tumor, tumor size 3 cm  Labs:   Results for orders placed during the hospital encounter of 11/17/10 (from the past 48 hour(s))  HEMOGLOBIN AND HEMATOCRIT, BLOOD     Status:  Abnormal   Collection Time   11/21/10  3:10 PM      Component Value Range Comment   Hemoglobin 8.8 (*) 13.0 - 17.0 (g/dL)    HCT 26.8 (*) 39.0 - 52.0 (%)   GLUCOSE, CAPILLARY     Status: Abnormal   Collection Time   11/21/10  5:17 PM      Component Value Range Comment   Glucose-Capillary 135 (*) 70 - 99 (mg/dL)    Comment 1 Notify RN      Comment 2 Documented in Chart     GLUCOSE, CAPILLARY     Status: Abnormal   Collection Time   11/21/10 11:38 PM      Component Value Range Comment   Glucose-Capillary 107 (*) 70 - 99 (mg/dL)   CBC     Status: Abnormal   Collection Time   11/22/10  5:17 AM      Component Value Range Comment   WBC 9.5  4.0 - 10.5 (K/uL)    RBC 3.81 (*) 4.22 - 5.81 (MIL/uL)    Hemoglobin 10.0 (*) 13.0 - 17.0 (g/dL)    HCT 30.4 (*) 39.0 - 52.0 (%)    MCV 79.8  78.0 - 100.0 (fL)    MCH 26.2  26.0 - 34.0 (pg)    MCHC 32.9  30.0 - 36.0 (g/dL)  RDW 19.8 (*) 11.5 - 15.5 (%)    Platelets 73 (*) 150 - 400 (K/uL)   BASIC METABOLIC PANEL     Status: Abnormal   Collection Time   11/22/10  5:17 AM      Component Value Range Comment   Sodium 138  135 - 145 (mEq/L)    Potassium 3.8  3.5 - 5.1 (mEq/L)    Chloride 106  96 - 112 (mEq/L)    CO2 23  19 - 32 (mEq/L)    Glucose, Bld 107 (*) 70 - 99 (mg/dL)    BUN 14  6 - 23 (mg/dL)    Creatinine, Ser 1.15  0.50 - 1.35 (mg/dL)    Calcium 8.8  8.4 - 10.5 (mg/dL)    GFR calc non Af Amer 62 (*) >90 (mL/min)    GFR calc Af Amer 72 (*) >90 (mL/min)   GLUCOSE, CAPILLARY     Status: Abnormal   Collection Time   11/22/10  7:40 AM      Component Value Range Comment   Glucose-Capillary 110 (*) 70 - 99 (mg/dL)    Comment 1 Notify RN      Comment 2 Documented in Chart     GLUCOSE, CAPILLARY     Status: Abnormal   Collection Time   11/22/10 11:20 AM      Component Value Range Comment   Glucose-Capillary 126 (*) 70 - 99 (mg/dL)    Comment 1 Notify RN      Comment 2 Documented in Chart     GLUCOSE, CAPILLARY     Status:  Abnormal   Collection Time   11/22/10  5:00 PM      Component Value Range Comment   Glucose-Capillary 148 (*) 70 - 99 (mg/dL)    Comment 1 Notify RN      Comment 2 Documented in Chart     GLUCOSE, CAPILLARY     Status: Abnormal   Collection Time   11/22/10  9:31 PM      Component Value Range Comment   Glucose-Capillary 104 (*) 70 - 99 (mg/dL)    Comment 1 Notify RN     CBC     Status: Abnormal   Collection Time   11/23/10  4:30 AM      Component Value Range Comment   WBC 8.1  4.0 - 10.5 (K/uL)    RBC 3.85 (*) 4.22 - 5.81 (MIL/uL)    Hemoglobin 10.0 (*) 13.0 - 17.0 (g/dL)    HCT 30.8 (*) 39.0 - 52.0 (%)    MCV 80.0  78.0 - 100.0 (fL)    MCH 26.0  26.0 - 34.0 (pg)    MCHC 32.5  30.0 - 36.0 (g/dL)    RDW 19.8 (*) 11.5 - 15.5 (%)    Platelets 81 (*) 150 - 400 (K/uL)   BASIC METABOLIC PANEL     Status: Abnormal   Collection Time   11/23/10  4:30 AM      Component Value Range Comment   Sodium 137  135 - 145 (mEq/L)    Potassium 3.9  3.5 - 5.1 (mEq/L)    Chloride 104  96 - 112 (mEq/L)    CO2 24  19 - 32 (mEq/L)    Glucose, Bld 105 (*) 70 - 99 (mg/dL)    BUN 15  6 - 23 (mg/dL)    Creatinine, Ser 1.29  0.50 - 1.35 (mg/dL)    Calcium 8.7  8.4 - 10.5 (mg/dL)  GFR calc non Af Amer 54 (*) >90 (mL/min)    GFR calc Af Amer 63 (*) >90 (mL/min)   GLUCOSE, CAPILLARY     Status: Normal   Collection Time   11/23/10  7:18 AM      Component Value Range Comment   Glucose-Capillary 96  70 - 99 (mg/dL)    Comment 1 Notify RN      Comment 2 Documented in Chart     GLUCOSE, CAPILLARY     Status: Abnormal   Collection Time   11/23/10 11:25 AM      Component Value Range Comment   Glucose-Capillary 122 (*) 70 - 99 (mg/dL)    Comment 1 Notify RN      Comment 2 Documented in Chart      URINALYSIS    Color, Urine    RED          Appearance    HAZY         Specific Gravity, Urine    1.020         pH    7.0         Glucose, UA    100         Bilirubin Urine    NEGATIVE         Ketones,  ur    15         Protein, ur    300 mg/dL">300         Urobilinogen, UA    4.0         Nitrite    POSITIVE         Leukocytes, UA    MODERATE         WBC, UA    7-10         RBC / HPF    TOO NUMEROUS TO COUNT         Bacteria, UA    FEW          MICROBIOLOGY   Urine culture showed 20,000 colonies of multiple bacterial morphotypes.  Hemoccult of the stool negative.  Diagnostics:  US Renal  11/17/2010  *RADIOLOGY REPORT*  Clinical Data: Hematuria  RENAL/URINARY TRACT ULTRASOUND COMPLETE  Comparison:  None.  Findings:  Right Kidney:  10.2 cm length.  No hydronephrosis.  Normal cortex and echogenicity.  No focal abnormality.  Left Kidney:  11.7 cm length.  Normal cortex and echogenicity.  No hydronephrosis or focal abnormality.  Bladder:  Hyperechoic solid appearing dependent bladder mass noted measuring 4.7 x 3.5 x 7.0 cm.  This is nonspecific by ultrasound and could represent intraluminal bladder clot/thrombus versus a solid mass/neoplasm.  No abdominal free fluid demonstrated.  IMPRESSION: Negative for hydronephrosis or acute obstruction.  7 cm echogenic heterogeneous dependent bladder mass could represent clot/thrombus versus mass or neoplasm.  This warrants further evaluation.  Original Report Authenticated By: Jerilynn Mages. Daryll Brod, M.D.   Dg Chest Portable 1 View  11/18/2010  *RADIOLOGY REPORT*  Clinical Data: Shortness of breath.  Post transfusion.  PORTABLE CHEST - 1 VIEW  Comparison: None.  Findings: Cardiomegaly.  Pulmonary vascular congestion most notable centrally.  No segmental consolidation or gross pneumothorax. Tortuous aorta.  IMPRESSION: Cardiomegaly.  Pulmonary vascular congestion most notable centrally.  No segmental consolidation or gross pneumothorax.  Tortuous aorta.  Original Report Authenticated By: Doug Sou, M.D.   EKG: Sinus tachycardia, right bundle branch block, left ventricular hypertrophy  Full Code   Hospital Course: See H&P for complete admission  details. The  patient is a 70 year old black male who is not seen a physician in many years. He had several weeks of hematuria. He became progressively weaker and therefore came to the emergency room. His hemoglobin was found to be 4.4, and he was transfused several units of packed blood cells.Marland Kitchen He was found to have a urinary tract infection in addition to the hematuria and was started on Rocephin. Ultrasound showed a 4.7 x 3.5 x 7 cm bladder mass. Dr. Geraldo Pitter was consulted. A Foley catheter was placed due to urinary retention. It was obstructed multiple times from blood clots. A continuous bladder irrigation catheter was placed. Patient continued to have hematuria and required several more blood transfusions. Patient had resection of the tumor by Dr. Geraldo Pitter. His bleeding has essentially stopped and his hemoglobin is stable. Pathology is pending. The Foley catheter will stay in place until removed by Dr. Geraldo Pitter. He has a followup appointment in 6 days.  He has a history of diet controlled diabetes which did not require any long-term medications. His blood sugars ranged in the low 100 range. He also has a history of hypertension and we have started amlodipine with good results. He will need to followup with a primary care physician. He wishes to followup with [facility]. He will need a blood pressure check and a hemoglobin check. Patient has been cleared by urology for discharge. He will be instructed on catheter care and given a leg bag. Home health nursing will be arranged for catheter management and blood pressure management. Total time on the day of discharge is greater than 30 minutes.  Discharge Exam: Blood pressure 165/71, pulse 74, temperature 97.9 F (36.6 C), temperature source Oral, resp. rate 20, height 5\' 6"  (1.676 m), weight 86.682 kg (191 lb 1.6 oz), SpO2 96.00%.  Exam unchanged from 11/22/2010   Signed: Doree Barthel L 11/23/2010, 2:06 PM

## 2010-11-23 NOTE — Progress Notes (Signed)
Pt d/c home arranged home care [facility] per pt choice.

## 2010-11-23 NOTE — Progress Notes (Signed)
Pt discharged to home via son. Pt alert, oriented and in stable condition at the time of transport. Pt discharged home with foley. Pt verbalized understanding of follow up appointments and discharge instructions.

## 2010-11-25 LAB — TYPE AND SCREEN
ABO/RH(D): B NEG
Unit division: 0

## 2010-11-28 ENCOUNTER — Encounter (HOSPITAL_COMMUNITY): Payer: Self-pay | Admitting: Urology

## 2012-09-17 IMAGING — US US RENAL
1 series · 14 of 25 positions shown · non-contrast
Comparison: None.

CLINICAL DATA: Hematuria

RENAL/URINARY TRACT ULTRASOUND COMPLETE

[Series 1: us renal · 0.25mm/px · 50 acquisitions, 14 frames shown]
[im 1/50]
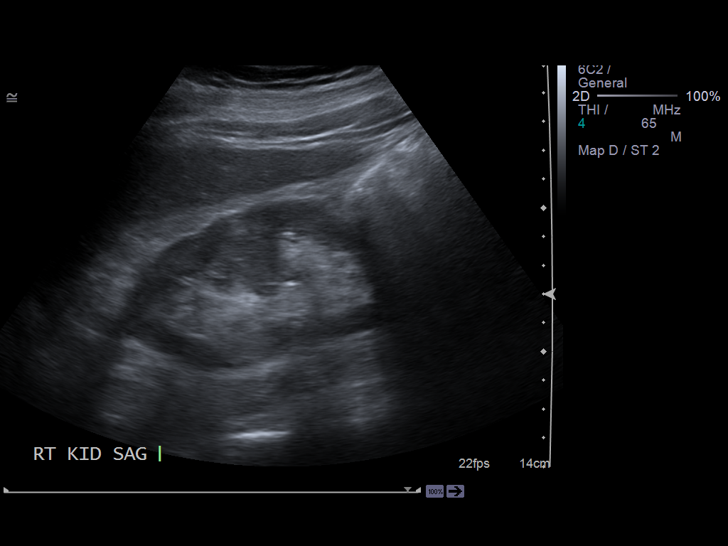
[im 5/50]
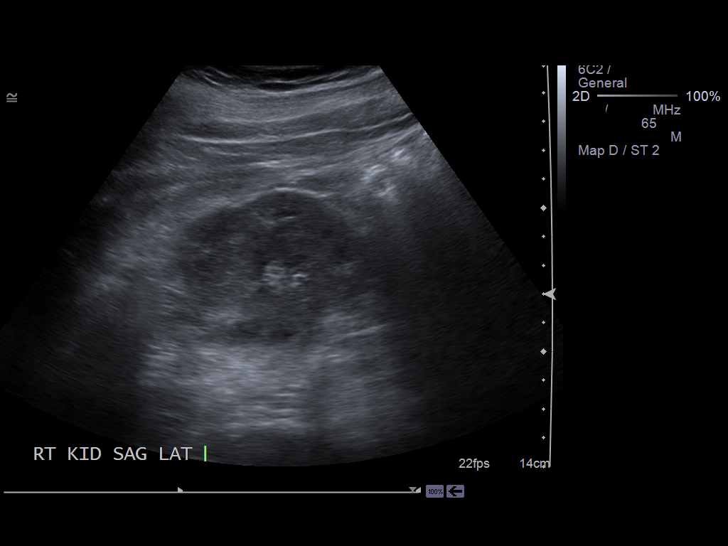
[im 9/50]
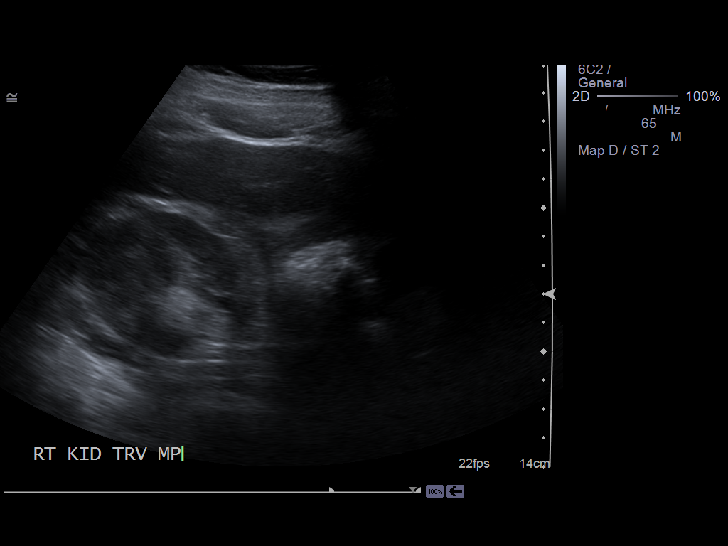
[im 13/50]
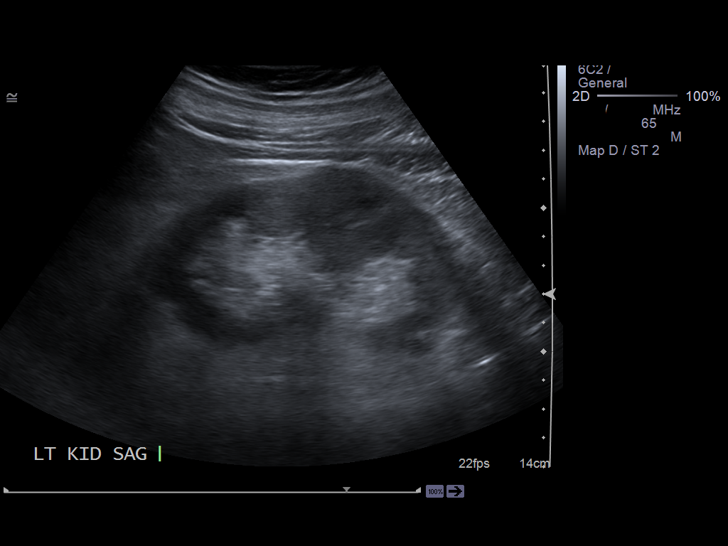
[im 17/50]
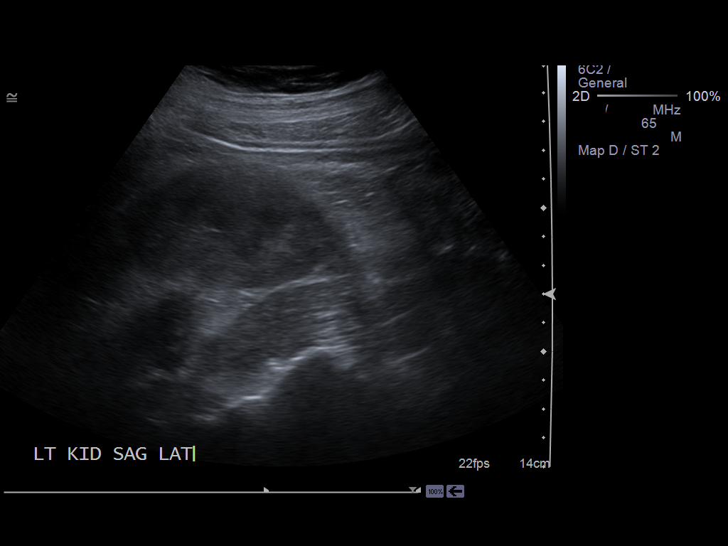
[im 19/50]
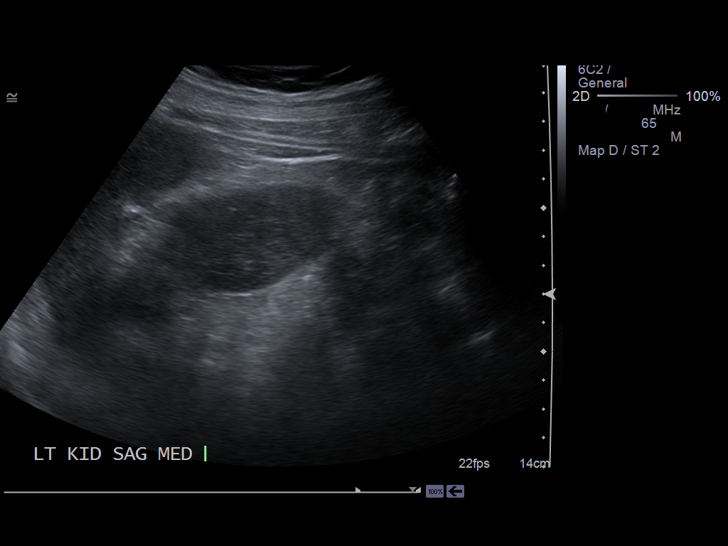
[im 23/50]
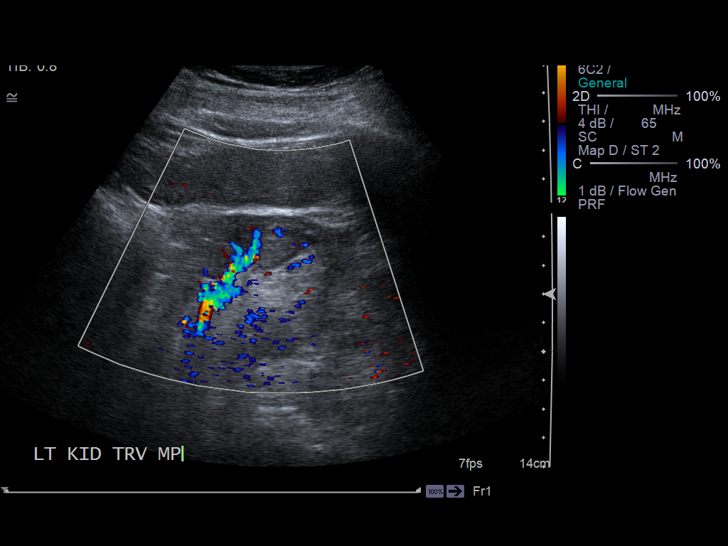
[im 27/50]
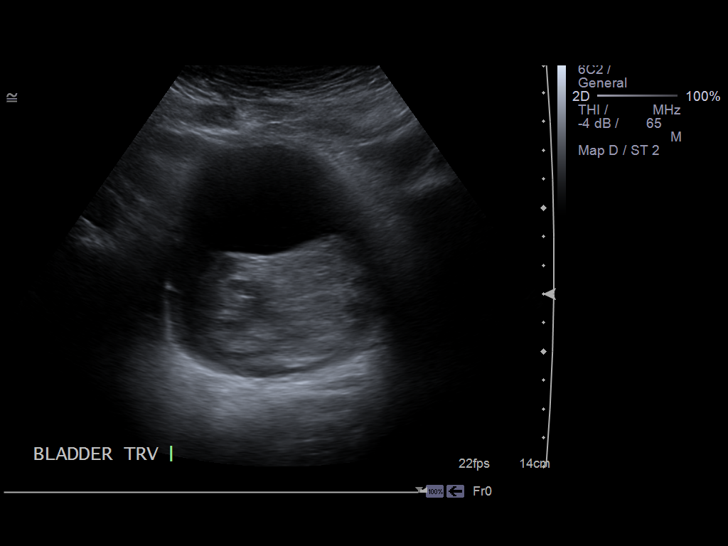
[im 31/50]
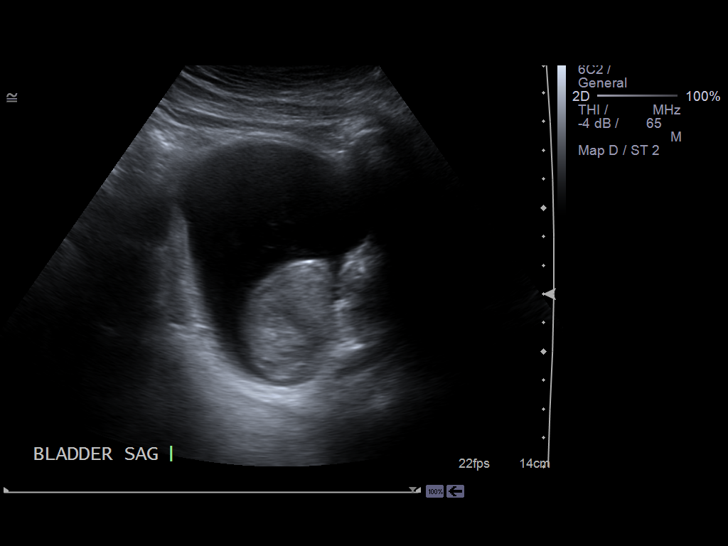
[im 33/50]
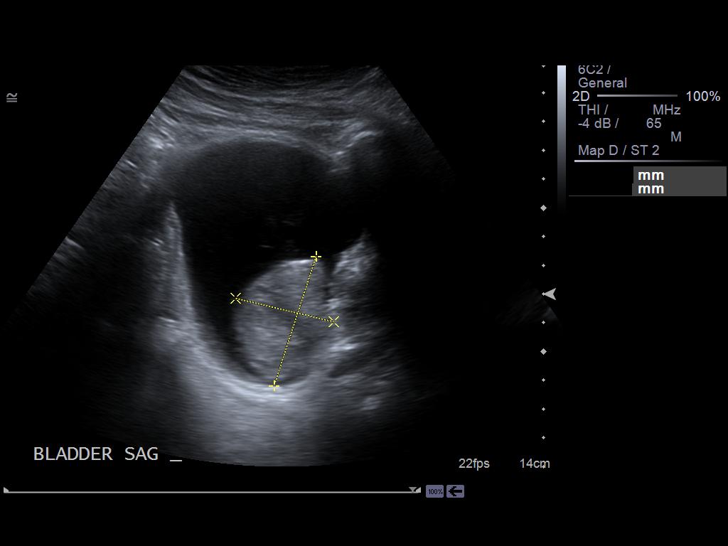
[im 37/50]
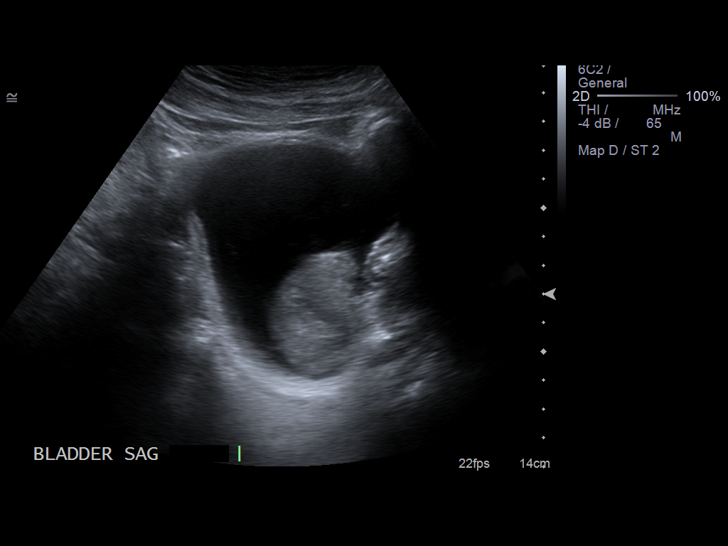
[im 41/50]
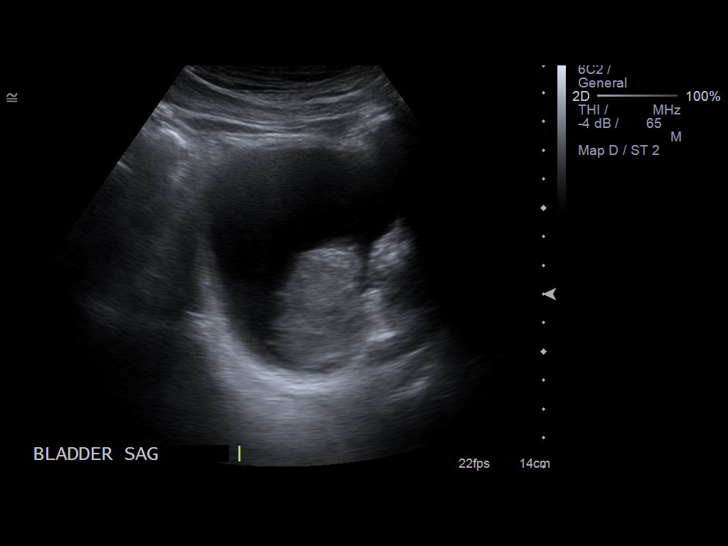
[im 45/50]
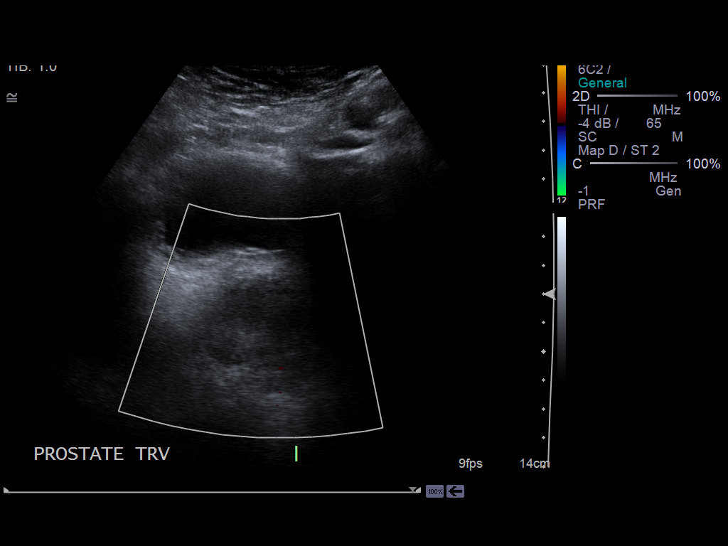
[im 50/50]
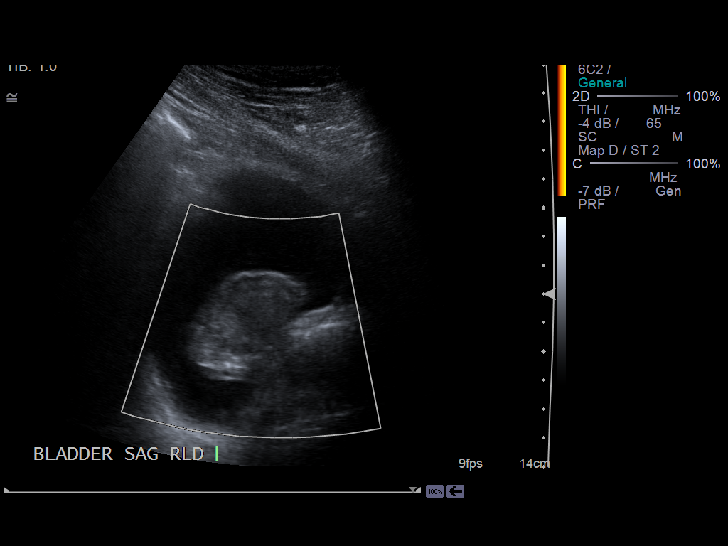

[14 of 25 positions shown; findings below may reference images not displayed]

FINDINGS: Right Kidney:  10.2 cm length.  No hydronephrosis.  Normal cortex
and echogenicity.  No focal abnormality.

Left Kidney:  11.7 cm length.  Normal cortex and echogenicity.  No
hydronephrosis or focal abnormality.

Bladder:  Hyperechoic solid appearing dependent bladder mass noted
measuring 4.7 x 3.5 x 7.0 cm.  This is nonspecific by ultrasound
and could represent intraluminal bladder clot/thrombus versus a
solid mass/neoplasm.

No abdominal free fluid demonstrated.
IMPRESSION: Negative for hydronephrosis or acute obstruction.

7 cm echogenic heterogeneous dependent bladder mass could represent
clot/thrombus versus mass or neoplasm.  This warrants further
evaluation.

## 2013-01-10 ENCOUNTER — Other Ambulatory Visit (HOSPITAL_COMMUNITY): Payer: Self-pay | Admitting: Family Medicine

## 2013-01-10 ENCOUNTER — Ambulatory Visit (HOSPITAL_COMMUNITY)
Admission: RE | Admit: 2013-01-10 | Discharge: 2013-01-10 | Disposition: A | Discharge status: Home / Self Care | Payer: Medicare Other | Source: Ambulatory Visit | Attending: Family Medicine | Admitting: Family Medicine

## 2013-01-10 DIAGNOSIS — M5380 Other specified dorsopathies, site unspecified: Secondary | ICD-10-CM | POA: Insufficient documentation

## 2013-01-10 DIAGNOSIS — M545 Low back pain, unspecified: Secondary | ICD-10-CM | POA: Insufficient documentation

## 2014-11-11 IMAGING — CR DG LUMBAR SPINE COMPLETE 4+V
5 series · 5 of 5 positions shown · non-contrast
Comparison: None.

CLINICAL DATA: Several month history of low back pain.

EXAM:
LUMBAR SPINE - COMPLETE 4+ VIEW

[view not recorded (1 of 5)]
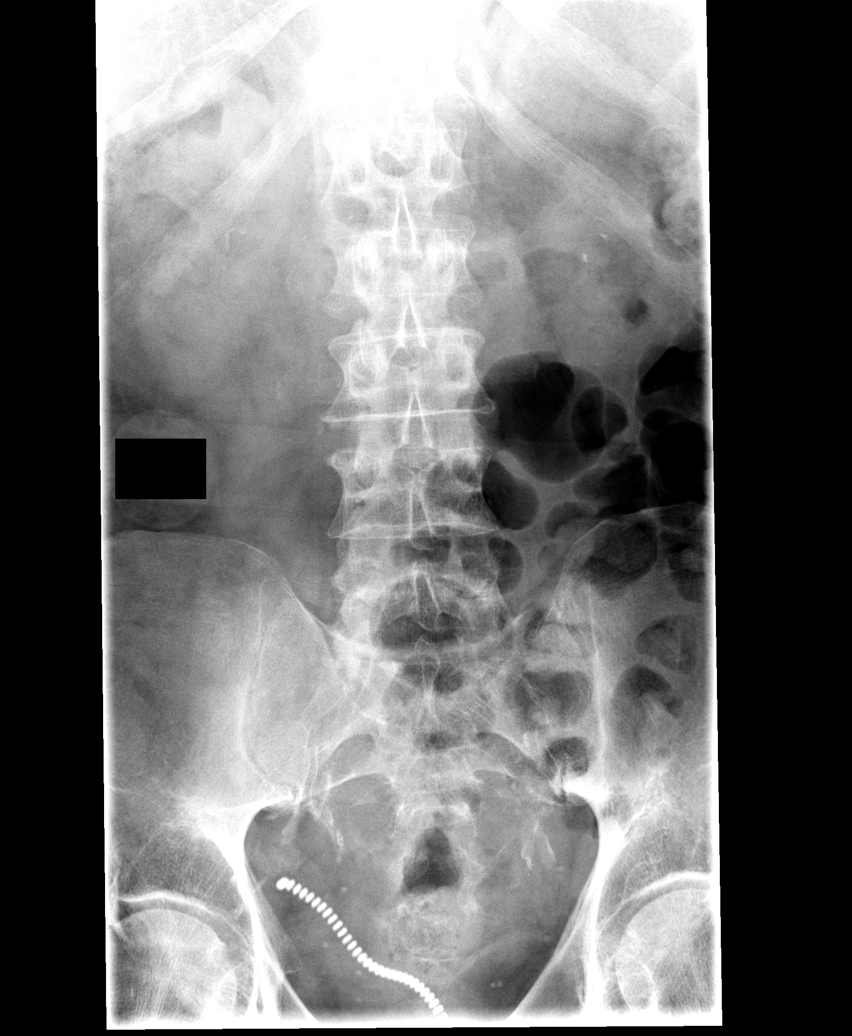

[view not recorded (2 of 5)]
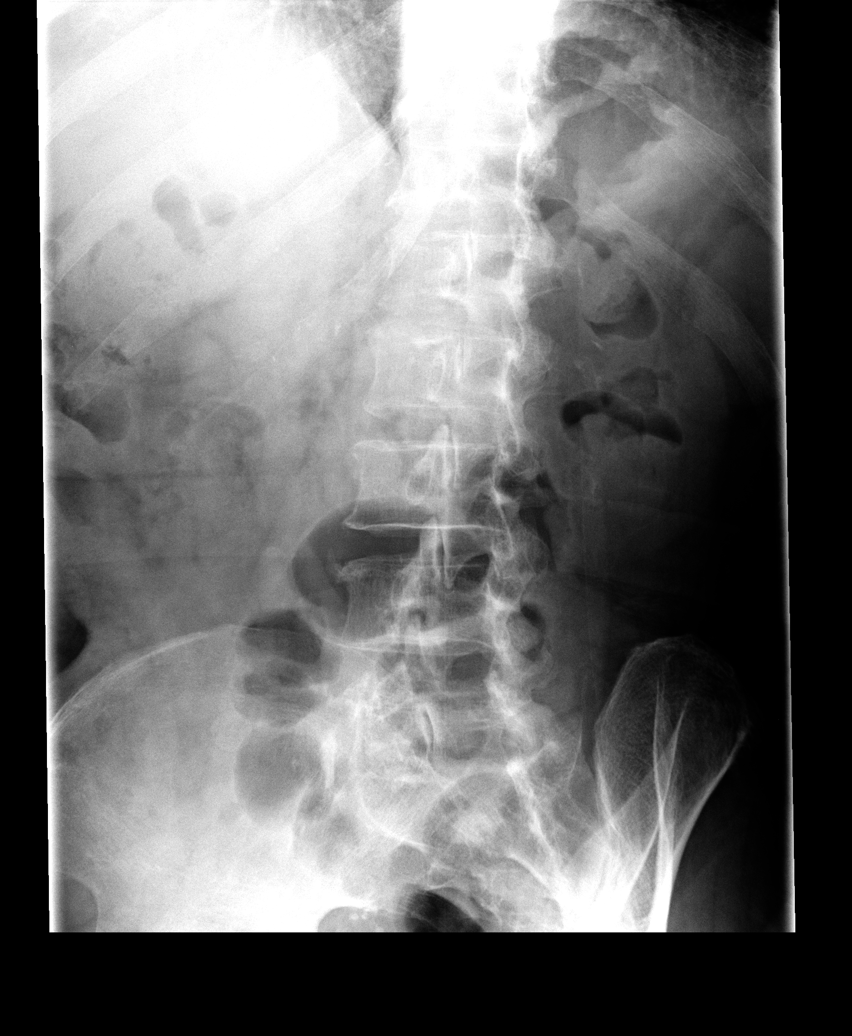

[view not recorded (3 of 5)]
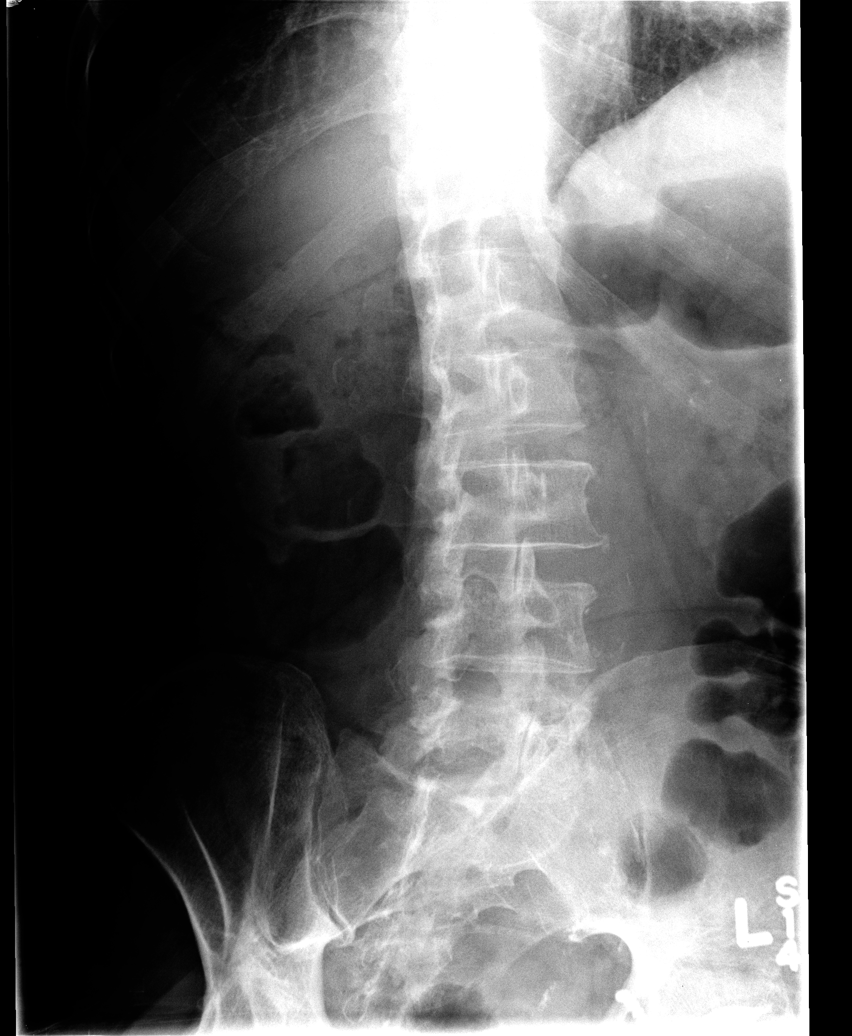

[view not recorded (4 of 5)]
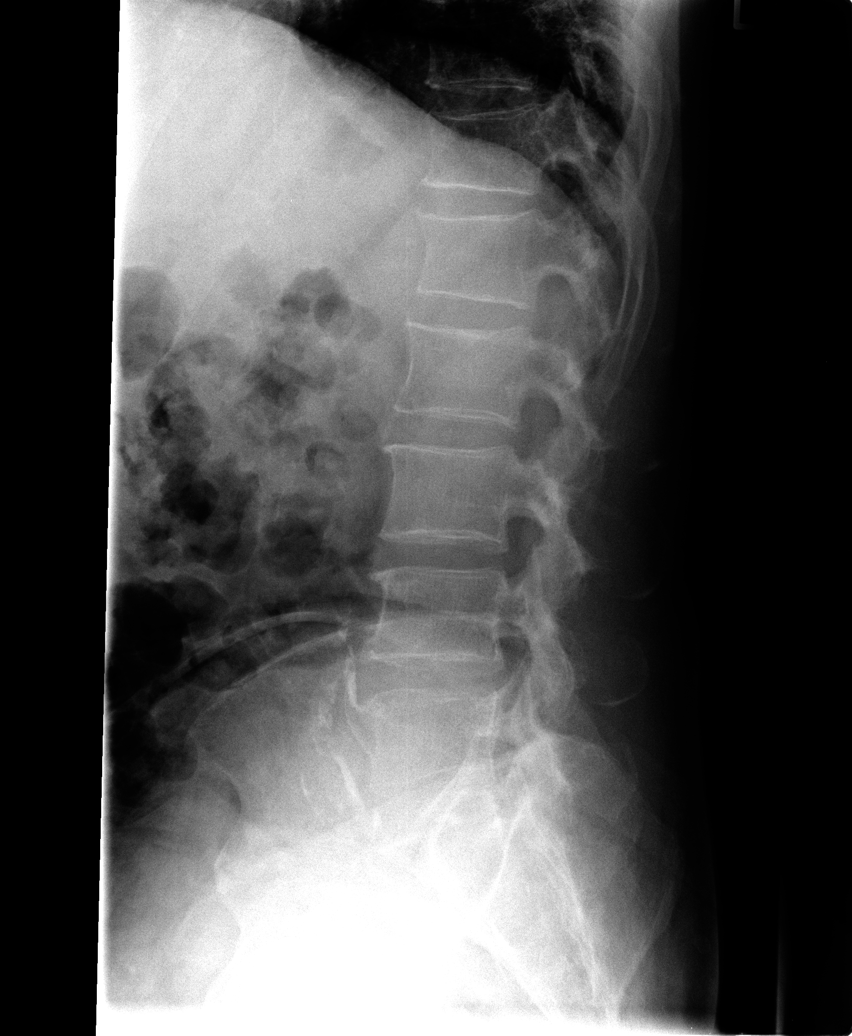

[view not recorded (5 of 5)]
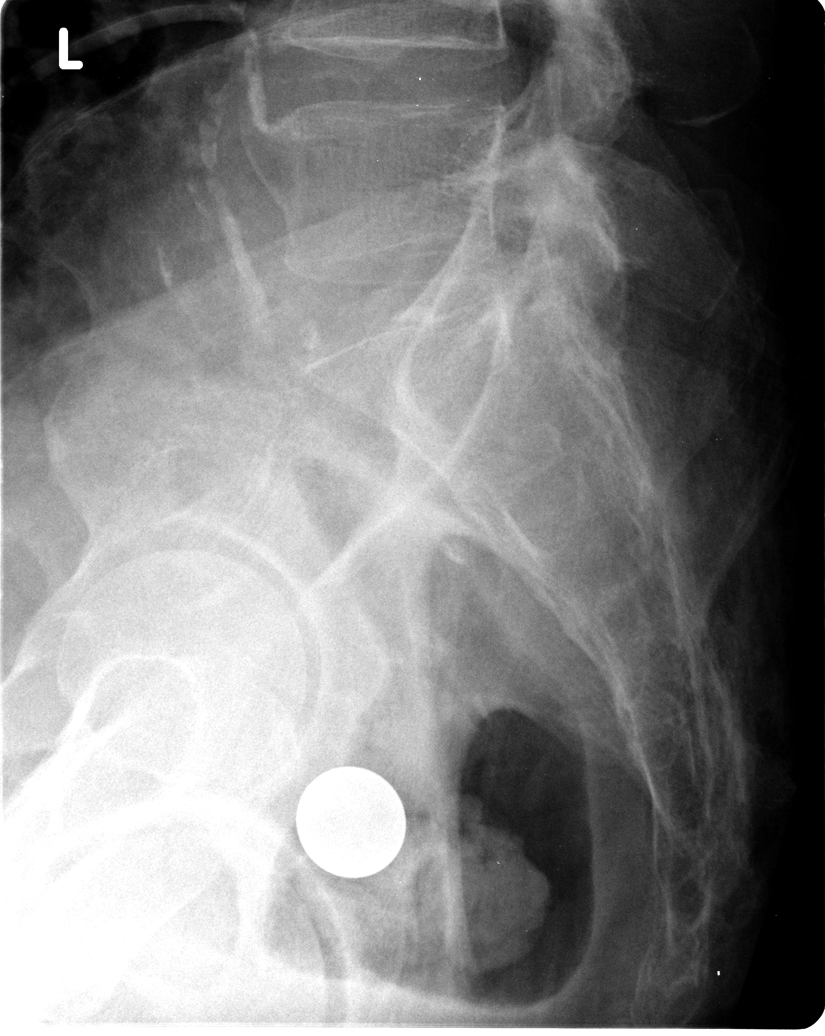

[5 of 5 positions shown; findings below may reference images not displayed]

FINDINGS: The lumbar vertebral bodies are preserved in height. The
intervertebral disc space heights are well maintained. Small
anterior endplate osteophytes are noted at L3 and L4 and L5. There
is no pars defect nor spondylolisthesis. The pedicles and transverse
processes appear intact. The observed portions of the sacrum appear
normal.
IMPRESSION: There is no acute bony abnormality of the lumbar spine nor evidence
of more than minimal degenerative change.

## 2017-12-28 ENCOUNTER — Telehealth: Payer: Self-pay | Admitting: *Deleted

## 2017-12-28 NOTE — Telephone Encounter (Signed)
Received referral for low dose lung cancer screening CT scan. Message left at phone number listed in EMR for patient to call me back to facilitate scheduling scan.  

## 2018-01-01 ENCOUNTER — Telehealth: Payer: Self-pay | Admitting: *Deleted

## 2018-01-01 NOTE — Telephone Encounter (Signed)
Received referral for low dose lung cancer screening CT scan. Message left at phone number listed in EMR for patient to call me back to facilitate scheduling scan.  

## 2018-01-02 ENCOUNTER — Encounter: Payer: Self-pay | Admitting: *Deleted

## 2019-12-07 ENCOUNTER — Encounter (HOSPITAL_COMMUNITY): Payer: Self-pay

## 2019-12-07 ENCOUNTER — Emergency Department (HOSPITAL_COMMUNITY): Payer: Medicare Other

## 2019-12-07 ENCOUNTER — Other Ambulatory Visit: Payer: Self-pay

## 2019-12-07 ENCOUNTER — Emergency Department (HOSPITAL_COMMUNITY)
Admission: EM | Admit: 2019-12-07 | Discharge: 2019-12-07 | Disposition: A | Discharge status: Home / Self Care | Payer: Medicare Other | Attending: Emergency Medicine | Admitting: Emergency Medicine

## 2019-12-07 DIAGNOSIS — R930 Abnormal findings on diagnostic imaging of skull and head, not elsewhere classified: Secondary | ICD-10-CM | POA: Diagnosis not present

## 2019-12-07 DIAGNOSIS — I2510 Atherosclerotic heart disease of native coronary artery without angina pectoris: Secondary | ICD-10-CM | POA: Diagnosis not present

## 2019-12-07 DIAGNOSIS — S62361A Nondisplaced fracture of neck of second metacarpal bone, left hand, initial encounter for closed fracture: Secondary | ICD-10-CM | POA: Diagnosis not present

## 2019-12-07 DIAGNOSIS — I10 Essential (primary) hypertension: Secondary | ICD-10-CM | POA: Insufficient documentation

## 2019-12-07 DIAGNOSIS — W108XXA Fall (on) (from) other stairs and steps, initial encounter: Secondary | ICD-10-CM | POA: Insufficient documentation

## 2019-12-07 DIAGNOSIS — Z79899 Other long term (current) drug therapy: Secondary | ICD-10-CM | POA: Insufficient documentation

## 2019-12-07 DIAGNOSIS — M79642 Pain in left hand: Secondary | ICD-10-CM | POA: Insufficient documentation

## 2019-12-07 DIAGNOSIS — M899 Disorder of bone, unspecified: Secondary | ICD-10-CM

## 2019-12-07 DIAGNOSIS — Y92018 Other place in single-family (private) house as the place of occurrence of the external cause: Secondary | ICD-10-CM | POA: Diagnosis not present

## 2019-12-07 DIAGNOSIS — S0093XA Contusion of unspecified part of head, initial encounter: Secondary | ICD-10-CM | POA: Diagnosis not present

## 2019-12-07 DIAGNOSIS — S62111A Displaced fracture of triquetrum [cuneiform] bone, right wrist, initial encounter for closed fracture: Secondary | ICD-10-CM | POA: Insufficient documentation

## 2019-12-07 DIAGNOSIS — R519 Headache, unspecified: Secondary | ICD-10-CM | POA: Insufficient documentation

## 2019-12-07 DIAGNOSIS — F17210 Nicotine dependence, cigarettes, uncomplicated: Secondary | ICD-10-CM | POA: Insufficient documentation

## 2019-12-07 DIAGNOSIS — S0011XA Contusion of right eyelid and periocular area, initial encounter: Secondary | ICD-10-CM | POA: Insufficient documentation

## 2019-12-07 DIAGNOSIS — E119 Type 2 diabetes mellitus without complications: Secondary | ICD-10-CM | POA: Diagnosis not present

## 2019-12-07 DIAGNOSIS — S6991XA Unspecified injury of right wrist, hand and finger(s), initial encounter: Secondary | ICD-10-CM | POA: Diagnosis present

## 2019-12-07 MED ORDER — ACETAMINOPHEN 325 MG PO TABS: 650.0000 mg | ORAL_TABLET | Freq: Four times a day (QID) | ORAL | 0 refills | Status: DC | PRN

## 2019-12-07 MED ORDER — IBUPROFEN 400 MG PO TABS
400.0000 mg | ORAL_TABLET | Freq: Three times a day (TID) | ORAL | 0 refills | Status: DC | PRN
Start: 2019-12-07 — End: 2021-09-01

## 2019-12-07 MED ORDER — ACETAMINOPHEN 325 MG PO TABS
650.0000 mg | ORAL_TABLET | Freq: Once | ORAL | Status: AC
  Administered 2019-12-07: 650 mg via ORAL
  Filled 2019-12-07: qty 2

## 2019-12-07 NOTE — ED Provider Notes (Signed)
[facility] EMERGENCY DEPARTMENT Provider Note   CSN: [id number] Arrival date & time: 12/07/19  1712     History Chief Complaint  Patient presents with  . Fall    Isaac Hunter is a 79 y.o. male w/ pmhx of CAD, DM2, HTN, presenting with a fall from home.  Patient reports he slipped on approximately 3-4 steps at home.  He denies loss of consciousness.  He says he thinks he struck his head on the stair.  He denies syncope prior to this.  He was reporting pain in his bilateral hands, as well as a mild headache and pain where his head struck the board.  He is not on blood thinners.  Has not taken anything for pain.  He is present with a family member at bedside.  HPI     Past Medical History:  Diagnosis Date  . CAD (coronary artery disease)    Non obstructive per cath 2001.  . Diabetes mellitus    hx per pt, no longer has?  . Hypertension    hx per pt, no long has??  . Tobacco abuse 11/17/2010    Patient Active Problem List   Diagnosis Date Noted  . Gross hematuria 11/17/2010  . Anemia due to blood loss 11/17/2010  . Thrombocytopenia ([place]) 11/17/2010  . Sinus tachycardia 11/17/2010  . Lactic acidosis 11/17/2010  . HTN (hypertension) 11/17/2010  . Bladder mass 11/17/2010  . DM type 2 (diabetes mellitus, type 2) ([place]) 11/17/2010  . Tobacco abuse 11/17/2010    Past Surgical History:  Procedure Laterality Date  . CYSTOSCOPY  11/21/2010   Procedure: CYSTOSCOPY;  Surgeon: Marissa Nestle;  Location: [facility];  Service: Urology;  Laterality: N/A;  . Left hand surgery    . TRANSURETHRAL RESECTION OF BLADDER TUMOR  11/21/2010   Procedure: TRANSURETHRAL RESECTION OF BLADDER TUMOR (TURBT);  Surgeon: Marissa Nestle;  Location: [facility];  Service: Urology;  Laterality: N/A;  evacuation of clots       Family History  Problem Relation Age of Onset  . Anesthesia problems Neg Hx   . Hypotension Neg Hx   . Malignant hyperthermia Neg Hx   . Pseudochol deficiency Neg Hx      Social History   Tobacco Use  . Smoking status: Current Every Day Smoker    Packs/day: 1.00    Years: 50.00    Pack years: 50.00    Types: Cigarettes  . Smokeless tobacco: Never Used  Substance Use Topics  . Alcohol use: No  . Drug use: No    Home Medications Prior to Admission medications   Medication Sig Start Date End Date Taking? Authorizing Provider  acetaminophen (TYLENOL) 325 MG tablet Take 2 tablets (650 mg total) by mouth every 6 (six) hours as needed for up to 30 doses for mild pain. 12/07/19   Wyvonnia Dusky, MD  amLODipine (NORVASC) 5 MG tablet Take 1 tablet (5 mg total) by mouth daily. 11/23/10 11/23/11  Delfina Redwood, MD  ibuprofen (ADVIL) 400 MG tablet Take 1 tablet (400 mg total) by mouth every 8 (eight) hours as needed for up to 20 doses for mild pain or moderate pain. 12/07/19   Wyvonnia Dusky, MD    Allergies    Patient has no known allergies.  Review of Systems   Review of Systems  Constitutional: Negative for chills and fever.  HENT: Negative for ear pain and sore throat.   Eyes: Negative for photophobia and visual disturbance.  Respiratory:  Negative for cough and shortness of breath.   Cardiovascular: Negative for chest pain and palpitations.  Gastrointestinal: Negative for abdominal pain and vomiting.  Genitourinary: Negative for dysuria and hematuria.  Musculoskeletal: Positive for arthralgias and myalgias.  Skin: Positive for rash and wound.  Neurological: Positive for headaches. Negative for syncope.  Psychiatric/Behavioral: Negative for agitation and confusion.  All other systems reviewed and are negative.   Physical Exam Updated Vital Signs BP (!) 166/76 (BP Location: Left Arm)   Pulse 89   Temp 98.7 F (37.1 C) (Oral)   Resp 17   Ht 6' (1.829 m)   Wt 77.1 kg   SpO2 98%   BMI 23.06 kg/m   Physical Exam Vitals and nursing note reviewed.  Constitutional:      Appearance: He is well-developed.  HENT:     Head:  Normocephalic.     Comments: Hematoma right forehead Contusion around right eye Eyes:     Conjunctiva/sclera: Conjunctivae normal.     Pupils: Pupils are equal, round, and reactive to light.  Cardiovascular:     Rate and Rhythm: Normal rate and regular rhythm.     Heart sounds: No murmur heard.   Pulmonary:     Effort: Pulmonary effort is normal. No respiratory distress.     Breath sounds: Normal breath sounds.  Abdominal:     General: There is no distension.     Palpations: Abdomen is soft.     Tenderness: There is no abdominal tenderness.  Musculoskeletal:     Cervical back: Neck supple.     Comments: Swelling and tenderness of left 2nd and 3rd metacarpal body  TTP fo the bilateral wrists Mild ttp of right hand 2nd and 3rd metacarpal No ttp of the elbow, forearm, shoulder Full ROM of the elbows, shoulders, hips, and knee No other sign of acute traumatic joint injury on extremity exam  No back contusions or tenderness  Skin:    General: Skin is warm and dry.  Neurological:     General: No focal deficit present.     Mental Status: He is alert and oriented to person, place, and time.     Sensory: No sensory deficit.     Motor: No weakness.     Comments: No spinal midline tenderness Ambulating to bathroom  Psychiatric:        Mood and Affect: Mood normal.        Behavior: Behavior normal.     ED Results / Procedures / Treatments   Labs (all labs ordered are listed, but only abnormal results are displayed) Labs Reviewed - No data to display  EKG None  Radiology DG Wrist Complete Left  Result Date: 12/07/2019 CLINICAL DATA:  Bilateral hand and wrist pain after fall. Second and third metacarpal swelling of bilateral hands. EXAM: LEFT WRIST - COMPLETE 3+ VIEW COMPARISON:  None. FINDINGS: There is no evidence of fracture or dislocation. 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 Alignment and joint spaces otherwise maintained. No focal soft tissue  abnormality. Soft tissues are unremarkable. IMPRESSION: No fracture or dislocation of the left wrist. Electronically Signed   By: Keith Rake M.D.   On: 12/07/2019 19:04   DG Wrist Complete Right  Result Date: 12/07/2019 CLINICAL DATA:  Second and third metacarpal swelling bilateral hands. Fall this afternoon attempted to catch himself, now with bilateral hand and wrist pain. EXAM: RIGHT WRIST - COMPLETE 3+ VIEW COMPARISON:  None. FINDINGS: Minimally displaced triquetral fracture visualized on the lateral view.  No other fracture of the wrist. Alignment is maintained. Minor osteoarthritis at the thumb carpal metacarpal joint. No erosion or bony destruction. Dorsal soft tissue edema overlies the wrist. IMPRESSION: Minimally displaced triquetral fracture with dorsal soft tissue edema. Electronically Signed   By: Keith Rake M.D.   On: 12/07/2019 19:00   CT Head Wo Contrast  Result Date: 12/07/2019 CLINICAL DATA:  [place] down steps tonight. Head injury. Large hematoma of the RIGHT forehead. EXAM: CT HEAD WITHOUT CONTRAST TECHNIQUE: Contiguous axial images were obtained from the base of the skull through the vertex without intravenous contrast. COMPARISON:  None. FINDINGS: Brain: There is central and cortical atrophy. Periventricular white matter changes are consistent with small vessel disease. There is no intra or extra-axial fluid collection or mass lesion. The basilar cisterns and ventricles have a normal appearance. There is no CT evidence for acute infarction or hemorrhage. Vascular: There is atherosclerotic calcification of the internal carotid arteries. No hyperdense vessels. Skull: Within the RIGHT frontal bone, there is a lytic lesion involving both the INNER and OUTER tables and measuring 7 millimeters in diameter. Findings raise suspicion for metastasis. Overlying hematoma makes it difficult to assess whether there is an associated soft tissue component. No other suspicious osseous lesions.  Sinuses/Orbits: No acute finding. Other: Large RIGHT frontal scalp hematoma without underlying calvarial fracture. IMPRESSION: 1. No evidence for acute intracranial hemorrhage. 2. Large RIGHT frontal scalp hematoma without underlying calvarial fracture. 3. 7 millimeter lytic lesion within the RIGHT frontal bone suspicious for metastasis. Recommend further evaluation with bone scan. 4. Atrophy and small vessel disease. Electronically Signed   By: Nolon Nations M.D.   On: 12/07/2019 19:17   DG Hand Complete Left  Result Date: 12/07/2019 CLINICAL DATA:  Second and third metacarpal swelling bilateral hands. Fall this afternoon attempted to catch himself, now with bilateral hand and wrist pain EXAM: LEFT HAND - COMPLETE 3+ VIEW COMPARISON:  None. FINDINGS: Acute fracture of the second digit proximal phalanx is minimally displaced, angulated, and extends to the metacarpal phalangeal joint. Osseous remottling of the third and fourth proximal phalanges appears to represent sequela of remote fracture, no acute fracture line is seen. The third digit is held in flexion at the distal interphalangeal joint on all views. Mild osteoarthritis of the fifth metacarpal phalangeal joint, and scattered throughout the digits. Bones appear under mineralized. Soft tissue edema noted in the region of the fracture site IMPRESSION: 1. Acute minimally displaced and angulated intra-articular fracture of the second digit proximal phalanx extending to the metacarpophalangeal joint. 2. Osseous remottling of the third and fourth proximal phalanges, consistent with remote prior fractures. No acute fracture line is seen. Electronically Signed   By: Keith Rake M.D.   On: 12/07/2019 19:02   DG Hand Complete Right  Result Date: 12/07/2019 CLINICAL DATA:  Second and third metacarpal swelling bilateral hands. Fall this afternoon attempted to catch himself, now with bilateral hand and wrist pain. EXAM: RIGHT HAND - COMPLETE 3+ VIEW  COMPARISON:  None. FINDINGS: Triquetral fracture on concurrent wrist radiograph is not well seen on the provided views. No other evidence of fracture of the hand. Occasional osteoarthritis of the digits. The metacarpal phalangeal joints appear normal. No erosion or periosteal reaction. No focal soft tissue abnormality. IMPRESSION: Triquetral fracture on concurrent wrist radiograph is not well seen on the provided views. No additional fracture of the hand. Electronically Signed   By: Keith Rake M.D.   On: 12/07/2019 18:59    Procedures Procedures (including  critical care time)  Medications Ordered in ED Medications  acetaminophen (TYLENOL) tablet 650 mg (650 mg Oral Given 12/07/19 1936)    ED Course  I have reviewed the triage vital signs and the nursing notes.  Pertinent labs & imaging results that were available during my care of the patient were reviewed by me and considered in my medical decision making (see chart for details).  This is an 79 year old male present emerge department a mechanical fall down 3-4 steps.  Plan to x-rays of bilateral hands and wrist.  Will also obtain a CT scan of the head.  He is otherwise neurologically intact and able to ambulate in the ER.  I see no other evidence of acute trauma on exam, including spinal fracture, pelvic fracture, or other injuries of the extremities.  We are giving Tylenol for pain.  *  Update - CTH with lytic bone lesion, no ICH, hematoma noted in forehead  Xrays with minimally displaced triquetrum fx of right wrist, metacarpal fx of 2nd left digit  Consulted with hand surgeon Dr Doreatha Martin, who recommended a volar splint of the left hand, and a removable velcro wrist splint for the right wrist, in order to afford the patient some mobility and support of his cane.  I reviewed the xrays, plan with patient and his son.  I also reviewed the CT findings and need for further outpt testing for lytic bone lesion.  Clinical Course as of Dec 07 128  Sun Dec 07, 2019  1925 IMPRESSION: 1. No evidence for acute intracranial hemorrhage. 2. Large RIGHT frontal scalp hematoma without underlying calvarial fracture.  3. 7 millimeter lytic lesion within the RIGHT frontal bone suspicious for metastasis. Recommend further evaluation with bone scan. 4. Atrophy and small vessel disease.   [MT]    Clinical Course User Index [MT] Wyvonnia Dusky, MD    Final Clinical Impression(s) / ED Diagnoses Final diagnoses:  Triquetral chip fracture, right, closed, initial encounter  Nondisplaced fracture of neck of second metacarpal bone, left hand, initial encounter for closed fracture  Traumatic hematoma of head, initial encounter  Skull lesion    Rx / DC Orders ED Discharge Orders         Ordered    acetaminophen (TYLENOL) 325 MG tablet  Every 6 hours PRN        12/07/19 2027    ibuprofen (ADVIL) 400 MG tablet  Every 8 hours PRN        12/07/19 2027           Wyvonnia Dusky, MD 12/08/19 0130

## 2019-12-07 NOTE — ED Notes (Signed)
Pt has large hematoma on right forehead with bruising noted , abrasion edema to right under eye, left hand first two fingers swollen and painful with purple bruising, right hand painful no bruising noted. Pt able to ambulate with walker. A&Ox4

## 2019-12-07 NOTE — ED Triage Notes (Addendum)
Pt presents to ED after unwitnessed fall at home. Pt hit his head on the wooden steps. Pt with knot and edema above right eye. Unsure of LOC. Pt son states he is "not paying as much attention as normal." Pt takes ASA daily. Pt oriented to place, name and DOB. Disoriented to year.

## 2019-12-07 NOTE — Discharge Instructions (Signed)
Your CT scan showed that you have a "bone lesion," which may be a sign of bone cancer.  Please talk to your primary care doctor about arranging for a "whole body bone scan."  Your broken bones need to be seen by the hand surgeon in the office in 1-2 weeks.  Call to make an appointment with him.  You have a broken 2nd finger in your left hand.  Do not use that hand to lift or move anything.   You also have a small chip fracture in your right wrist.  You can use your right hand at home.  At home you can take motrin 400 mg every 6 hours for pain, and tylenol 650 mg every 6 hours for pain.  Keep your cast dry and clean.

## 2019-12-08 ENCOUNTER — Emergency Department (HOSPITAL_COMMUNITY)
Admission: EM | Admit: 2019-12-08 | Discharge: 2019-12-08 | Disposition: A | Discharge status: Home / Self Care | Payer: Medicare Other | Attending: Emergency Medicine | Admitting: Emergency Medicine

## 2019-12-08 ENCOUNTER — Other Ambulatory Visit: Payer: Self-pay

## 2019-12-08 ENCOUNTER — Encounter (HOSPITAL_COMMUNITY): Payer: Self-pay | Admitting: *Deleted

## 2019-12-08 DIAGNOSIS — I2510 Atherosclerotic heart disease of native coronary artery without angina pectoris: Secondary | ICD-10-CM | POA: Diagnosis not present

## 2019-12-08 DIAGNOSIS — E119 Type 2 diabetes mellitus without complications: Secondary | ICD-10-CM | POA: Insufficient documentation

## 2019-12-08 DIAGNOSIS — R220 Localized swelling, mass and lump, head: Secondary | ICD-10-CM

## 2019-12-08 DIAGNOSIS — Z7982 Long term (current) use of aspirin: Secondary | ICD-10-CM | POA: Diagnosis not present

## 2019-12-08 DIAGNOSIS — I10 Essential (primary) hypertension: Secondary | ICD-10-CM | POA: Diagnosis not present

## 2019-12-08 DIAGNOSIS — Z7901 Long term (current) use of anticoagulants: Secondary | ICD-10-CM | POA: Diagnosis not present

## 2019-12-08 DIAGNOSIS — Z79899 Other long term (current) drug therapy: Secondary | ICD-10-CM | POA: Diagnosis not present

## 2019-12-08 DIAGNOSIS — F17210 Nicotine dependence, cigarettes, uncomplicated: Secondary | ICD-10-CM | POA: Diagnosis not present

## 2019-12-08 DIAGNOSIS — R600 Localized edema: Secondary | ICD-10-CM | POA: Diagnosis present

## 2019-12-08 DIAGNOSIS — R58 Hemorrhage, not elsewhere classified: Secondary | ICD-10-CM | POA: Diagnosis not present

## 2019-12-08 NOTE — ED Provider Notes (Signed)
°  Face-to-face evaluation   History: Patient here for evaluation of facial swelling, initially after fall, yesterday, worse today.  Otherwise he is doing well, he is eating, walking, not having headache, neck pain or back pain.  He is here with his son who helps to give history.  Physical exam: Alert, elderly male who is calm and cooperative.  Moderate periorbital swelling, right greater than left with small abrasion beneath the right eye.  No sign of infection of the face.  Unable to visualize both globes and he demonstrates normal range of motion, of the eyes, which is painless.  Medical screening examination/treatment/procedure(s) were conducted as a shared visit with non-physician practitioner(s) and myself.  I personally evaluated the patient during the encounter    Daleen Bo, MD 12/09/19 1217

## 2019-12-08 NOTE — ED Triage Notes (Signed)
Fell at home, seen yesterday for same, both eyes swollen shut today, denies pain

## 2019-12-08 NOTE — ED Provider Notes (Signed)
[facility] EMERGENCY DEPARTMENT Provider Note   CSN: [id number] Arrival date & time: 12/08/19  1655     History Chief Complaint  Patient presents with  . Facial Swelling    Isaac Hunter is a 79 y.o. male presents with son for evaluation of facial swelling.  Patient was seen here last night for evaluation of fall from home.  He fell down 3-4 steps and hit his head and had a hematoma frontal scalp.  He had a CT head that was unremarkable.  Patient reports that he was doing fine was discharged home.  He reports that this morning, he started noticing that he started having swelling around his periorbital regions.  He states that he has not any more trauma, injury, fall.  He can see when the eyelids are open.  He states that they have gotten puffier since this afternoon.  He states that the hematoma has improved on his head.  Denies any numbness/weakness, difficulty seeing, vomiting.  The history is provided by the patient and a relative.       Past Medical History:  Diagnosis Date  . CAD (coronary artery disease)    Non obstructive per cath 2001.  . Diabetes mellitus    hx per pt, no longer has?  . Hypertension    hx per pt, no long has??  . Tobacco abuse 11/17/2010    Patient Active Problem List   Diagnosis Date Noted  . Gross hematuria 11/17/2010  . Anemia due to blood loss 11/17/2010  . Thrombocytopenia ([place]) 11/17/2010  . Sinus tachycardia 11/17/2010  . Lactic acidosis 11/17/2010  . HTN (hypertension) 11/17/2010  . Bladder mass 11/17/2010  . DM type 2 (diabetes mellitus, type 2) ([place]) 11/17/2010  . Tobacco abuse 11/17/2010    Past Surgical History:  Procedure Laterality Date  . CYSTOSCOPY  11/21/2010   Procedure: CYSTOSCOPY;  Surgeon: Marissa Nestle;  Location: [facility];  Service: Urology;  Laterality: N/A;  . Left hand surgery    . TRANSURETHRAL RESECTION OF BLADDER TUMOR  11/21/2010   Procedure: TRANSURETHRAL RESECTION OF BLADDER TUMOR (TURBT);  Surgeon:  Marissa Nestle;  Location: [facility];  Service: Urology;  Laterality: N/A;  evacuation of clots       Family History  Problem Relation Age of Onset  . Anesthesia problems Neg Hx   . Hypotension Neg Hx   . Malignant hyperthermia Neg Hx   . Pseudochol deficiency Neg Hx     Social History   Tobacco Use  . Smoking status: Current Every Day Smoker    Packs/day: 1.00    Years: 50.00    Pack years: 50.00    Types: Cigarettes  . Smokeless tobacco: Never Used  Substance Use Topics  . Alcohol use: No  . Drug use: No    Home Medications Prior to Admission medications   Medication Sig Start Date End Date Taking? Authorizing Provider  acetaminophen (TYLENOL) 500 MG tablet Take 500 mg by mouth every 6 (six) hours as needed.   Yes [provider]  amLODipine (NORVASC) 5 MG tablet Take 1 tablet (5 mg total) by mouth daily. 11/23/10 12/08/19 Yes Delfina Redwood, MD  atorvastatin (LIPITOR) 80 MG tablet Take by mouth.   Yes [provider]  chlorthalidone (HYGROTON) 25 MG tablet Take 25 mg by mouth every morning. 08/08/19  Yes [provider]  Cholecalciferol 25 MCG (1000 UT) tablet Take 2,000 Units by mouth daily.    Yes [provider]  [facility]  ASPIRIN LOW DOSE 81 MG EC tablet Take 81 mg by mouth daily. 09/02/19  Yes [provider]  ibuprofen (ADVIL) 400 MG tablet Take 1 tablet (400 mg total) by mouth every 8 (eight) hours as needed for up to 20 doses for mild pain or moderate pain. 12/07/19  Yes Wyvonnia Dusky, MD  NIFEdipine (ADALAT CC) 30 MG 24 hr tablet SMARTSIG:1 Tablet(s) By Mouth Daily 08/08/19  Yes [provider]  acetaminophen (TYLENOL) 325 MG tablet Take 2 tablets (650 mg total) by mouth every 6 (six) hours as needed for up to 30 doses for mild pain. Patient not taking: Reported on 12/08/2019 12/07/19   Wyvonnia Dusky, MD  clopidogrel (PLAVIX) 75 MG tablet Take by mouth. Patient not taking: Reported on 12/08/2019    [provider]  quinapril (ACCUPRIL) 40 MG tablet Take by mouth.    [provider]    Allergies    Patient has no known allergies.  Review of Systems   Review of Systems  HENT: Positive for facial swelling.   Eyes: Negative for pain, redness and visual disturbance.  Gastrointestinal: Negative for nausea and vomiting.  Neurological: Negative for weakness, light-headedness and numbness.  All other systems reviewed and are negative.   Physical Exam Updated Vital Signs BP (!) 158/68 (BP Location: Right Arm)   Pulse 77   Temp 98.7 F (37.1 C) (Oral)   Resp 18   Wt 77.1 kg   SpO2 100%   BMI 23.06 kg/m   Physical Exam Vitals and nursing note reviewed.  Constitutional:      Appearance: Normal appearance. He is well-developed.  HENT:     Head: Normocephalic.     Comments: No skull deformity or crepitus noted.  He has edema, ecchymosis noted to the bilateral periorbital region, right greater than left.  No periorbital tenderness, deformity or crepitus noted. Eyes:     General: Lids are normal.     Conjunctiva/sclera: Conjunctivae normal.     Pupils: Pupils are equal, round, and reactive to light.     Comments: PERRL. EOMs intact without any pain. No nystagmus. No neglect. Visual fields intact when eyelids are manually opened. Normal conjunctiva bilatearlly.   Cardiovascular:     Rate and Rhythm: Normal rate and regular rhythm.     Pulses: Normal pulses.     Heart sounds: Normal heart sounds. No murmur heard.  No friction rub. No gallop.   Pulmonary:     Effort: Pulmonary effort is normal.     Breath sounds: Normal breath sounds.  Abdominal:     Palpations: Abdomen is soft. Abdomen is not rigid.     Tenderness: There is no abdominal tenderness. There is no guarding.  Musculoskeletal:        General: Normal range of motion.     Cervical back: Full passive range of motion without pain.  Skin:    General: Skin is warm and dry.     Capillary Refill: Capillary refill  takes less than 2 seconds.     Comments: Small scabbed wound noted to forehead.   Neurological:     Mental Status: He is alert and oriented to person, place, and time.     Comments: Cranial nerves III-XII intact Follows commands, Moves all extremities  5/5 strength to BUE and BLE  Sensation intact throughout all major nerve distributions Normal finger to nose. No gait abnormalities  No slurred speech. No facial droop.   Psychiatric:  Speech: Speech normal.     ED Results / Procedures / Treatments   Labs (all labs ordered are listed, but only abnormal results are displayed) Labs Reviewed - No data to display  EKG None  Radiology DG Wrist Complete Left  Result Date: 12/07/2019 CLINICAL DATA:  Bilateral hand and wrist pain after fall. Second and third metacarpal swelling of bilateral hands. EXAM: LEFT WRIST - COMPLETE 3+ VIEW COMPARISON:  None. FINDINGS: There is no evidence of fracture or dislocation. 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 Alignment and joint spaces otherwise maintained. No focal soft tissue abnormality. Soft tissues are unremarkable. IMPRESSION: No fracture or dislocation of the left wrist. Electronically Signed   By: Keith Rake M.D.   On: 12/07/2019 19:04   DG Wrist Complete Right  Result Date: 12/07/2019 CLINICAL DATA:  Second and third metacarpal swelling bilateral hands. Fall this afternoon attempted to catch himself, now with bilateral hand and wrist pain. EXAM: RIGHT WRIST - COMPLETE 3+ VIEW COMPARISON:  None. FINDINGS: Minimally displaced triquetral fracture visualized on the lateral view. No other fracture of the wrist. Alignment is maintained. Minor osteoarthritis at the thumb carpal metacarpal joint. No erosion or bony destruction. Dorsal soft tissue edema overlies the wrist. IMPRESSION: Minimally displaced triquetral fracture with dorsal soft tissue edema. Electronically Signed   By: Keith Rake M.D.   On: 12/07/2019  19:00   CT Head Wo Contrast  Result Date: 12/07/2019 CLINICAL DATA:  [place] down steps tonight. Head injury. Large hematoma of the RIGHT forehead. EXAM: CT HEAD WITHOUT CONTRAST TECHNIQUE: Contiguous axial images were obtained from the base of the skull through the vertex without intravenous contrast. COMPARISON:  None. FINDINGS: Brain: There is central and cortical atrophy. Periventricular white matter changes are consistent with small vessel disease. There is no intra or extra-axial fluid collection or mass lesion. The basilar cisterns and ventricles have a normal appearance. There is no CT evidence for acute infarction or hemorrhage. Vascular: There is atherosclerotic calcification of the internal carotid arteries. No hyperdense vessels. Skull: Within the RIGHT frontal bone, there is a lytic lesion involving both the INNER and OUTER tables and measuring 7 millimeters in diameter. Findings raise suspicion for metastasis. Overlying hematoma makes it difficult to assess whether there is an associated soft tissue component. No other suspicious osseous lesions. Sinuses/Orbits: No acute finding. Other: Large RIGHT frontal scalp hematoma without underlying calvarial fracture. IMPRESSION: 1. No evidence for acute intracranial hemorrhage. 2. Large RIGHT frontal scalp hematoma without underlying calvarial fracture. 3. 7 millimeter lytic lesion within the RIGHT frontal bone suspicious for metastasis. Recommend further evaluation with bone scan. 4. Atrophy and small vessel disease. Electronically Signed   By: Nolon Nations M.D.   On: 12/07/2019 19:17   DG Hand Complete Left  Result Date: 12/07/2019 CLINICAL DATA:  Second and third metacarpal swelling bilateral hands. Fall this afternoon attempted to catch himself, now with bilateral hand and wrist pain EXAM: LEFT HAND - COMPLETE 3+ VIEW COMPARISON:  None. FINDINGS: Acute fracture of the second digit proximal phalanx is minimally displaced, angulated, and extends  to the metacarpal phalangeal joint. Osseous remottling of the third and fourth proximal phalanges appears to represent sequela of remote fracture, no acute fracture line is seen. The third digit is held in flexion at the distal interphalangeal joint on all views. Mild osteoarthritis of the fifth metacarpal phalangeal joint, and scattered throughout the digits. Bones appear under mineralized. Soft tissue edema noted in the region of the fracture site  IMPRESSION: 1. Acute minimally displaced and angulated intra-articular fracture of the second digit proximal phalanx extending to the metacarpophalangeal joint. 2. Osseous remottling of the third and fourth proximal phalanges, consistent with remote prior fractures. No acute fracture line is seen. Electronically Signed   By: Keith Rake M.D.   On: 12/07/2019 19:02   DG Hand Complete Right  Result Date: 12/07/2019 CLINICAL DATA:  Second and third metacarpal swelling bilateral hands. Fall this afternoon attempted to catch himself, now with bilateral hand and wrist pain. EXAM: RIGHT HAND - COMPLETE 3+ VIEW COMPARISON:  None. FINDINGS: Triquetral fracture on concurrent wrist radiograph is not well seen on the provided views. No other evidence of fracture of the hand. Occasional osteoarthritis of the digits. The metacarpal phalangeal joints appear normal. No erosion or periosteal reaction. No focal soft tissue abnormality. IMPRESSION: Triquetral fracture on concurrent wrist radiograph is not well seen on the provided views. No additional fracture of the hand. Electronically Signed   By: Keith Rake M.D.   On: 12/07/2019 18:59    Procedures Procedures (including critical care time)  Medications Ordered in ED Medications - No data to display  ED Course  I have reviewed the triage vital signs and the nursing notes.  Pertinent labs & imaging results that were available during my care of the patient were reviewed by me and considered in my medical  decision making (see chart for details).    MDM Rules/Calculators/A&P                          79 y.o. M who presents for evaluation facial swelling that began today. He fell last night and was seen in the ED for evaluation. He had a head CT at that time that showed no ICH. He was discharged home. Today started having swelling around periorbital region. No vision changes. EOMs intact without any difficulty. No deformity or crepitus noted to the periorbital region.  Unsure when he is afebrile, nontoxic-appearing.  Vital signs are stable.  On exam, no neuro deficits.  When I open up his eyelids, he is able to see my fingers.  No conjunctival injection.  I suspect this is likely ecchymosis and swelling from his previous hematoma that has spread down his face. Do not suspect orbital fracture. Patient with no new trauma. No indication to repeat imaging tonight. Encouraged at home supportive care measures with patient and son. At this time, patient exhibits no emergent life-threatening condition that require further evaluation in ED or admission. Discussed patient with Dr. Eulis Foster. Patient had ample opportunity for questions and discussion. All patient's questions were answered with full understanding. Strict return precautions discussed. Patient expresses understanding and agreement to plan.   Portions of this note were generated with [employer]. Dictation errors may occur despite best attempts at proofreading.  Final Clinical Impression(s) / ED Diagnoses Final diagnoses:  Facial swelling  Ecchymosis    Rx / DC Orders ED Discharge Orders    None       Desma Mcgregor 12/08/19 2357    Daleen Bo, MD 12/09/19 1217

## 2019-12-08 NOTE — Discharge Instructions (Signed)
As we discussed, your swelling is most likely from blood following pulse gravity.  This will continue to spread on your face.  You can apply ice as well as sitting up at an angle to help.  Follow-up with your primary care doctor.  Return the emergency department for any pain, difficulty seeing, difficulty walking, vomiting or any other worsening concerning symptoms.

## 2021-08-11 ENCOUNTER — Emergency Department (HOSPITAL_COMMUNITY)
Admission: EM | Admit: 2021-08-11 | Discharge: 2021-08-12 | Disposition: A | Discharge status: Skilled Nursing Facility | Payer: Medicare Other | Attending: Emergency Medicine | Admitting: Emergency Medicine

## 2021-08-11 ENCOUNTER — Emergency Department (HOSPITAL_COMMUNITY): Payer: Medicare Other

## 2021-08-11 ENCOUNTER — Encounter (HOSPITAL_COMMUNITY): Payer: Self-pay

## 2021-08-11 ENCOUNTER — Other Ambulatory Visit: Payer: Self-pay

## 2021-08-11 DIAGNOSIS — Z79899 Other long term (current) drug therapy: Secondary | ICD-10-CM | POA: Insufficient documentation

## 2021-08-11 DIAGNOSIS — Z7982 Long term (current) use of aspirin: Secondary | ICD-10-CM | POA: Insufficient documentation

## 2021-08-11 DIAGNOSIS — R55 Syncope and collapse: Secondary | ICD-10-CM | POA: Diagnosis present

## 2021-08-11 DIAGNOSIS — R7989 Other specified abnormal findings of blood chemistry: Secondary | ICD-10-CM | POA: Diagnosis not present

## 2021-08-11 DIAGNOSIS — N3000 Acute cystitis without hematuria: Secondary | ICD-10-CM

## 2021-08-11 DIAGNOSIS — E119 Type 2 diabetes mellitus without complications: Secondary | ICD-10-CM | POA: Insufficient documentation

## 2021-08-11 DIAGNOSIS — I10 Essential (primary) hypertension: Secondary | ICD-10-CM | POA: Diagnosis not present

## 2021-08-11 LAB — URINALYSIS, ROUTINE W REFLEX MICROSCOPIC
Bilirubin Urine: NEGATIVE
Glucose, UA: NEGATIVE mg/dL
Ketones, ur: NEGATIVE mg/dL
Nitrite: NEGATIVE
Protein, ur: 100 mg/dL — AB
Specific Gravity, Urine: 1.012 (ref 1.005–1.030)
Trans Epithel, UA: 1
pH: 7 (ref 5.0–8.0)

## 2021-08-11 LAB — CBC
HCT: 31 % — ABNORMAL LOW (ref 39.0–52.0)
Hemoglobin: 10 g/dL — ABNORMAL LOW (ref 13.0–17.0)
MCH: 27 pg (ref 26.0–34.0)
MCHC: 32.3 g/dL (ref 30.0–36.0)
MCV: 83.6 fL (ref 80.0–100.0)
Platelets: 84 10*3/uL — ABNORMAL LOW (ref 150–400)
RBC: 3.71 MIL/uL — ABNORMAL LOW (ref 4.22–5.81)
RDW: 15.4 % (ref 11.5–15.5)
WBC: 6.4 10*3/uL (ref 4.0–10.5)
nRBC: 0 % (ref 0.0–0.2)

## 2021-08-11 LAB — BASIC METABOLIC PANEL
Anion gap: 5 (ref 5–15)
BUN: 69 mg/dL — ABNORMAL HIGH (ref 8–23)
CO2: 24 mmol/L (ref 22–32)
Calcium: 9.6 mg/dL (ref 8.9–10.3)
Chloride: 112 mmol/L — ABNORMAL HIGH (ref 98–111)
Creatinine, Ser: 2.76 mg/dL — ABNORMAL HIGH (ref 0.61–1.24)
GFR, Estimated: 22 mL/min — ABNORMAL LOW (ref >60)
Glucose, Bld: 112 mg/dL — ABNORMAL HIGH (ref 70–99)
Potassium: 4.9 mmol/L (ref 3.5–5.1)
Sodium: 141 mmol/L (ref 135–145)

## 2021-08-11 MED ORDER — SODIUM CHLORIDE 0.9 % IV BOLUS
1000.0000 mL | Freq: Once | INTRAVENOUS | Status: AC
  Administered 2021-08-11: 1000 mL via INTRAVENOUS

## 2021-08-11 NOTE — ED Triage Notes (Signed)
Pt had syncopal episode.  [facility] called EMS d/t to syncope, weakness and incontinence,   Pt is there d/t Kidney disease.

## 2021-08-11 NOTE — ED Provider Notes (Signed)
[facility] EMERGENCY DEPARTMENT Provider Note   CSN: [id number] Arrival date & time: 08/11/21  2006     History  Chief Complaint  Patient presents with   Loss of Consciousness    Isaac Hunter is a 81 y.o. male.  Patient is an 81 year old male with past medical history of hypertension, type 2 diabetes, anemia, and recent admission at [facility] for acute renal failure.  Upon discharge, creatinine was 2.27.  Patient presenting today for evaluation of syncope and.  From what I am told, patient had an episode of syncope, then was sent here.  He is awake and alert upon my evaluation and has no complaints.  He denies abdominal pain, dysuria, or fevers.  He was just discharged today from [facility] after an admission for elevated creatinine/acute renal failure.  The history is provided by the patient.       Home Medications Prior to Admission medications   Medication Sig Start Date End Date Taking? Authorizing Provider  atorvastatin (LIPITOR) 80 MG tablet Take by mouth.   Yes [provider]  chlorthalidone (HYGROTON) 25 MG tablet Take 25 mg by mouth every morning. 08/08/19  Yes [provider]  FEROSUL 325 (65 Fe) MG tablet Take by mouth. 07/07/21  Yes [provider]  lisinopril (ZESTRIL) 40 MG tablet Take 40 mg by mouth daily.   Yes [provider]  Melatonin 3 MG CAPS Take by mouth.   Yes [provider]  mirtazapine (REMERON) 7.5 MG tablet Take 7.5 mg by mouth at bedtime. 08/11/21 09/10/21 Yes [provider]  NIFEdipine (ADALAT CC) 30 MG 24 hr tablet Take 1 tablet by mouth daily. 08/08/19  Yes [provider]  acetaminophen (TYLENOL) 325 MG tablet Take 2 tablets (650 mg total) by mouth every 6 (six) hours as needed for up to 30 doses for mild pain. Patient not taking: Reported on 12/08/2019 12/07/19   Wyvonnia Dusky, MD  acetaminophen (TYLENOL) 500 MG tablet Take 500 mg by mouth every 6 (six) hours as needed.    [provider]  amLODipine (NORVASC) 10 MG tablet Take 1 tablet by mouth daily. 08/12/21 08/12/22  [provider]  amLODipine (NORVASC) 5 MG tablet Take 1 tablet (5 mg total) by mouth daily. 11/23/10 12/08/19  Delfina Redwood, MD  aspirin EC 81 MG tablet Take 1 tablet by mouth daily.    [provider]  chlorthalidone (HYGROTEN) 15 MG tablet Take by mouth.    [provider]  Cholecalciferol (VITAMIN D-1000 MAX ST) 25 MCG (1000 UT) tablet Take by mouth.    [provider]  Cholecalciferol 25 MCG (1000 UT) tablet Take 2,000 Units by mouth daily.     [provider]  clopidogrel (PLAVIX) 75 MG tablet Take by mouth. Patient not taking: Reported on 12/08/2019    [provider]  GNP ASPIRIN LOW DOSE 81 MG EC tablet Take 81 mg by mouth daily. 09/02/19   [provider]  ibuprofen (ADVIL) 400 MG tablet Take 1 tablet (400 mg total) by mouth every 8 (eight) hours as needed for up to 20 doses for mild pain or moderate pain. 12/07/19   Wyvonnia Dusky, MD  NIFEdipine (ADALAT CC) 30 MG 24 hr tablet SMARTSIG:1 Tablet(s) By Mouth Daily 08/08/19   [provider]  quinapril (ACCUPRIL) 40 MG tablet Take 40 mg by mouth daily.    [provider]      Allergies    Patient has  no known allergies.    Review of Systems   Review of Systems  All other systems reviewed and are negative.   Physical Exam Updated Vital Signs BP (!) 150/62   Pulse 73   Temp 98.3 F (36.8 C) (Oral)   Resp (!) 21   Ht '5\' 10"'$  (1.778 m)   Wt 65.8 kg   SpO2 99%   BMI 20.81 kg/m  Physical Exam Vitals and nursing note reviewed.  Constitutional:      General: He is not in acute distress.    Appearance: He is well-developed. He is not diaphoretic.  HENT:     Head: Normocephalic and atraumatic.  Cardiovascular:     Rate and Rhythm: Normal rate and regular rhythm.     Heart sounds: No murmur heard.    No friction rub.  Pulmonary:     Effort:  Pulmonary effort is normal. No respiratory distress.     Breath sounds: Normal breath sounds. No wheezing or rales.  Abdominal:     General: Bowel sounds are normal. There is no distension.     Palpations: Abdomen is soft.     Tenderness: There is no abdominal tenderness.  Musculoskeletal:        General: Normal range of motion.     Cervical back: Normal range of motion and neck supple.  Skin:    General: Skin is warm and dry.  Neurological:     Mental Status: He is alert and oriented to person, place, and time.     Coordination: Coordination normal.     ED Results / Procedures / Treatments   Labs (all labs ordered are listed, but only abnormal results are displayed) Labs Reviewed  BASIC METABOLIC PANEL - Abnormal; Notable for the following components:      Result Value   Chloride 112 (*)    Glucose, Bld 112 (*)    BUN 69 (*)    Creatinine, Ser 2.76 (*)    GFR, Estimated 22 (*)    All other components within normal limits  CBC - Abnormal; Notable for the following components:   RBC 3.71 (*)    Hemoglobin 10.0 (*)    HCT 31.0 (*)    Platelets 84 (*)    All other components within normal limits  URINALYSIS, ROUTINE W REFLEX MICROSCOPIC - Abnormal; Notable for the following components:   APPearance HAZY (*)    Hgb urine dipstick SMALL (*)    Protein, ur 100 (*)    Leukocytes,Ua TRACE (*)    Bacteria, UA RARE (*)    All other components within normal limits    EKG None  Radiology CT Head Wo Contrast  Result Date: 08/11/2021 CLINICAL DATA:  Syncope/presyncope, cerebrovascular cause suspected EXAM: CT HEAD WITHOUT CONTRAST TECHNIQUE: Contiguous axial images were obtained from the base of the skull through the vertex without intravenous contrast. RADIATION DOSE REDUCTION: This exam was performed according to the departmental dose-optimization program which includes automated exposure control, adjustment of the mA and/or kV according to patient size and/or use of iterative  reconstruction technique. COMPARISON:  CT head 12/07/2019 BRAIN: BRAIN Cerebral ventricle sizes are concordant with the degree of cerebral volume loss. Patchy and confluent areas of decreased attenuation are noted throughout the deep and periventricular white matter of the cerebral hemispheres bilaterally, compatible with chronic microvascular ischemic disease. Chronic right parietooccipital encephalomalacia. No evidence of large-territorial acute infarction. No parenchymal hemorrhage. No mass lesion. No extra-axial collection. No mass effect or midline shift. No hydrocephalus.  Basilar cisterns are patent. Vascular: No hyperdense vessel. Atherosclerotic calcifications are present within the cavernous internal carotid arteries. Skull: No acute fracture or focal lesion. Sinuses/Orbits: Paranasal sinuses and mastoid air cells are clear. The orbits are unremarkable. Other: None. IMPRESSION: No acute intracranial abnormality. Electronically Signed   By: Iven Finn M.D.   On: 08/11/2021 21:45    Procedures Procedures    Medications Ordered in ED Medications  sodium chloride 0.9 % bolus 1,000 mL (has no administration in time range)    ED Course/ Medical Decision Making/ A&P  This patient presents to the ED for concern of syncope, this involves an extensive number of treatment options, and is a complaint that carries with it a high risk of complications and morbidity.  The differential diagnosis includes seizure, vasovagal syncope, cardiac arrhythmia   Co morbidities that complicate the patient evaluation  Recent admission for renal issues   Additional history obtained:  External records from outside source obtained and reviewed including recent discharge summary from [facility]   Lab Tests:  I Ordered, and personally interpreted labs.  The pertinent results include: BUN of 69 and creatinine of 2.7.  This is slightly elevated from earlier this morning prior to discharge from [place]  Studies ordered:  I ordered imaging studies including head CT I independently visualized and interpreted imaging which showed no acute process I agree with the radiologist interpretation   Cardiac Monitoring: / EKG:  The patient was maintained on a cardiac monitor.  I personally viewed and interpreted the cardiac monitored which showed an underlying rhythm of: Sinus   Consultations Obtained:  No consultations obtained   Problem List / ED Course / Critical interventions / Medication management  Patient presenting after an episode of altered level of consciousness that occurred at his extended care facility.  He was recently discharged from [facility] after being admitted for bladder cancer and renal failure.  Patient's electrolytes are slightly elevated from this morning's discharge, but no other acute abnormalities noted.  He was given IV hydration for this.  Urinalysis suggestive of UTI and patient given Rocephin.  He will be discharged with Keflex. I have reviewed the patients home medicines and have made adjustments as needed   Social Determinants of Health:  None   Test / Admission - Considered:  I see no obvious indication for admission.  Patient to be returned to his extended care facility.   Final Clinical Impression(s) / ED Diagnoses Final diagnoses:  None    Rx / DC Orders ED Discharge Orders     None         Veryl Speak, MD 08/12/21 [phone]

## 2021-08-12 MED ORDER — SODIUM CHLORIDE 0.9 % IV SOLN
1.0000 g | Freq: Once | INTRAVENOUS | Status: AC
  Administered 2021-08-12: 1 g via INTRAVENOUS
  Filled 2021-08-12: qty 10

## 2021-08-12 MED ORDER — CEPHALEXIN 500 MG PO CAPS: 500.0000 mg | ORAL_CAPSULE | Freq: Three times a day (TID) | ORAL | 0 refills | Status: DC

## 2021-08-12 NOTE — Discharge Instructions (Signed)
Begin taking Keflex as prescribed.  Return to the emergency department for any new and/or concerning symptoms.  You should have a repeat of your electrolytes and kidney function performed in the next week.

## 2021-08-31 ENCOUNTER — Emergency Department (HOSPITAL_COMMUNITY): Payer: Medicare Other

## 2021-08-31 ENCOUNTER — Other Ambulatory Visit: Payer: Self-pay

## 2021-08-31 ENCOUNTER — Inpatient Hospital Stay (HOSPITAL_COMMUNITY)
Admission: EM | Admit: 2021-08-31 | Discharge: 2021-09-03 | DRG: 871 | Disposition: A | Discharge status: Home Health | Payer: Medicare Other | Attending: Family Medicine | Admitting: Family Medicine

## 2021-08-31 DIAGNOSIS — I10 Essential (primary) hypertension: Secondary | ICD-10-CM

## 2021-08-31 DIAGNOSIS — R4182 Altered mental status, unspecified: Secondary | ICD-10-CM | POA: Diagnosis not present

## 2021-08-31 DIAGNOSIS — G4700 Insomnia, unspecified: Secondary | ICD-10-CM

## 2021-08-31 DIAGNOSIS — R6520 Severe sepsis without septic shock: Secondary | ICD-10-CM | POA: Diagnosis present

## 2021-08-31 DIAGNOSIS — R739 Hyperglycemia, unspecified: Secondary | ICD-10-CM

## 2021-08-31 DIAGNOSIS — F17210 Nicotine dependence, cigarettes, uncomplicated: Secondary | ICD-10-CM | POA: Diagnosis present

## 2021-08-31 DIAGNOSIS — A4152 Sepsis due to Pseudomonas: Secondary | ICD-10-CM | POA: Diagnosis not present

## 2021-08-31 DIAGNOSIS — E875 Hyperkalemia: Secondary | ICD-10-CM

## 2021-08-31 DIAGNOSIS — D696 Thrombocytopenia, unspecified: Secondary | ICD-10-CM | POA: Diagnosis present

## 2021-08-31 DIAGNOSIS — J9601 Acute respiratory failure with hypoxia: Secondary | ICD-10-CM | POA: Diagnosis present

## 2021-08-31 DIAGNOSIS — A419 Sepsis, unspecified organism: Secondary | ICD-10-CM

## 2021-08-31 DIAGNOSIS — R7401 Elevation of levels of liver transaminase levels: Secondary | ICD-10-CM

## 2021-08-31 DIAGNOSIS — Z8551 Personal history of malignant neoplasm of bladder: Secondary | ICD-10-CM

## 2021-08-31 DIAGNOSIS — I2510 Atherosclerotic heart disease of native coronary artery without angina pectoris: Secondary | ICD-10-CM | POA: Diagnosis present

## 2021-08-31 DIAGNOSIS — Z20822 Contact with and (suspected) exposure to covid-19: Secondary | ICD-10-CM | POA: Diagnosis present

## 2021-08-31 DIAGNOSIS — Z79899 Other long term (current) drug therapy: Secondary | ICD-10-CM

## 2021-08-31 DIAGNOSIS — I129 Hypertensive chronic kidney disease with stage 1 through stage 4 chronic kidney disease, or unspecified chronic kidney disease: Secondary | ICD-10-CM | POA: Diagnosis present

## 2021-08-31 DIAGNOSIS — E782 Mixed hyperlipidemia: Secondary | ICD-10-CM

## 2021-08-31 DIAGNOSIS — D631 Anemia in chronic kidney disease: Secondary | ICD-10-CM | POA: Diagnosis present

## 2021-08-31 DIAGNOSIS — N189 Chronic kidney disease, unspecified: Secondary | ICD-10-CM

## 2021-08-31 DIAGNOSIS — A4181 Sepsis due to Enterococcus: Secondary | ICD-10-CM | POA: Diagnosis present

## 2021-08-31 DIAGNOSIS — N184 Chronic kidney disease, stage 4 (severe): Secondary | ICD-10-CM | POA: Diagnosis present

## 2021-08-31 DIAGNOSIS — J449 Chronic obstructive pulmonary disease, unspecified: Secondary | ICD-10-CM | POA: Diagnosis present

## 2021-08-31 DIAGNOSIS — N390 Urinary tract infection, site not specified: Secondary | ICD-10-CM | POA: Diagnosis present

## 2021-08-31 DIAGNOSIS — Z7982 Long term (current) use of aspirin: Secondary | ICD-10-CM

## 2021-08-31 DIAGNOSIS — N179 Acute kidney failure, unspecified: Secondary | ICD-10-CM | POA: Diagnosis present

## 2021-08-31 LAB — COMPREHENSIVE METABOLIC PANEL
ALT: 95 U/L — ABNORMAL HIGH (ref 0–44)
AST: 67 U/L — ABNORMAL HIGH (ref 15–41)
Albumin: 3.6 g/dL (ref 3.5–5.0)
Alkaline Phosphatase: 70 U/L (ref 38–126)
Anion gap: 9 (ref 5–15)
BUN: 74 mg/dL — ABNORMAL HIGH (ref 8–23)
CO2: 20 mmol/L — ABNORMAL LOW (ref 22–32)
Calcium: 9.3 mg/dL (ref 8.9–10.3)
Chloride: 113 mmol/L — ABNORMAL HIGH (ref 98–111)
Creatinine, Ser: 4.05 mg/dL — ABNORMAL HIGH (ref 0.61–1.24)
GFR, Estimated: 14 mL/min — ABNORMAL LOW (ref >60)
Glucose, Bld: 179 mg/dL — ABNORMAL HIGH (ref 70–99)
Potassium: 5.3 mmol/L — ABNORMAL HIGH (ref 3.5–5.1)
Sodium: 142 mmol/L (ref 135–145)
Total Bilirubin: 0.8 mg/dL (ref 0.3–1.2)
Total Protein: 7.3 g/dL (ref 6.5–8.1)

## 2021-08-31 LAB — CBC WITH DIFFERENTIAL/PLATELET
Abs Immature Granulocytes: 0.23 10*3/uL — ABNORMAL HIGH (ref 0.00–0.07)
Basophils Absolute: 0.1 10*3/uL (ref 0.0–0.1)
Basophils Relative: 0 %
Eosinophils Absolute: 0 10*3/uL (ref 0.0–0.5)
Eosinophils Relative: 0 %
HCT: 30 % — ABNORMAL LOW (ref 39.0–52.0)
Hemoglobin: 9.7 g/dL — ABNORMAL LOW (ref 13.0–17.0)
Immature Granulocytes: 1 %
Lymphocytes Relative: 5 %
Lymphs Abs: 1 10*3/uL (ref 0.7–4.0)
MCH: 27.5 pg (ref 26.0–34.0)
MCHC: 32.3 g/dL (ref 30.0–36.0)
MCV: 85 fL (ref 80.0–100.0)
Monocytes Absolute: 1.9 10*3/uL — ABNORMAL HIGH (ref 0.1–1.0)
Monocytes Relative: 10 %
Neutro Abs: 15.8 10*3/uL — ABNORMAL HIGH (ref 1.7–7.7)
Neutrophils Relative %: 84 %
Platelets: 110 10*3/uL — ABNORMAL LOW (ref 150–400)
RBC: 3.53 MIL/uL — ABNORMAL LOW (ref 4.22–5.81)
RDW: 15.1 % (ref 11.5–15.5)
WBC: 19 10*3/uL — ABNORMAL HIGH (ref 4.0–10.5)
nRBC: 0 % (ref 0.0–0.2)

## 2021-08-31 LAB — LACTIC ACID, PLASMA
Lactic Acid, Venous: 2.9 mmol/L (ref 0.5–1.9)
Lactic Acid, Venous: 3.6 mmol/L (ref 0.5–1.9)

## 2021-08-31 LAB — APTT: aPTT: 37 seconds — ABNORMAL HIGH (ref 24–36)

## 2021-08-31 LAB — URINALYSIS, ROUTINE W REFLEX MICROSCOPIC
Bilirubin Urine: NEGATIVE
Glucose, UA: NEGATIVE mg/dL
Ketones, ur: NEGATIVE mg/dL
Nitrite: NEGATIVE
Protein, ur: 100 mg/dL — AB
Specific Gravity, Urine: 1.014 (ref 1.005–1.030)
pH: 7 (ref 5.0–8.0)

## 2021-08-31 LAB — PROTIME-INR
INR: 1.2 (ref 0.8–1.2)
Prothrombin Time: 15.3 seconds — ABNORMAL HIGH (ref 11.4–15.2)

## 2021-08-31 MED ORDER — SODIUM CHLORIDE 0.9 % IV SOLN
2.0000 g | Freq: Once | INTRAVENOUS | Status: AC
  Administered 2021-08-31: 2 g via INTRAVENOUS
  Filled 2021-08-31: qty 12.5

## 2021-08-31 MED ORDER — LACTATED RINGERS IV BOLUS (SEPSIS)
1000.0000 mL | Freq: Once | INTRAVENOUS | Status: AC
  Administered 2021-08-31: 1000 mL via INTRAVENOUS

## 2021-08-31 MED ORDER — LACTATED RINGERS IV SOLN: INTRAVENOUS | Status: AC

## 2021-08-31 MED ORDER — METRONIDAZOLE 500 MG/100ML IV SOLN
500.0000 mg | Freq: Once | INTRAVENOUS | Status: AC
  Administered 2021-08-31: 500 mg via INTRAVENOUS
  Filled 2021-08-31: qty 100

## 2021-08-31 MED ORDER — VANCOMYCIN HCL IN DEXTROSE 1-5 GM/200ML-% IV SOLN
1000.0000 mg | Freq: Once | INTRAVENOUS | Status: AC
  Administered 2021-09-01: 1000 mg via INTRAVENOUS
  Filled 2021-08-31: qty 200

## 2021-08-31 NOTE — ED Notes (Signed)
Patient transported to CT 

## 2021-08-31 NOTE — ED Notes (Signed)
Son, Edd Arbour, updated as to patient's status.

## 2021-08-31 NOTE — Sepsis Progress Note (Signed)
Elink following Code Sepsis. ° °Notified provider of need to order repeat lactic acid.  °

## 2021-08-31 NOTE — ED Notes (Signed)
X-ray at bedside

## 2021-08-31 NOTE — ED Provider Notes (Signed)
[facility] EMERGENCY DEPARTMENT Provider Note   CSN: [id number] Arrival date & time: 08/31/21  1801     History  Chief Complaint  Patient presents with   Altered Mental Status    Isaac Hunter is a 81 y.o. male.  Patient brought in by EMS for mental status change.  For dizziness onset today at 9 in the morning.  He denies any visual changes slurred speech any weakness or numbness.  Reports frequent falls.  Denies any recent falls involving hitting her head.  Patient was alert and oriented.  Patient without any fevers.    There was some question of milky colored urine.  No signs of having for heart rate of 92 blood pressure 145, respirations 20.  Oxygen sats 100%.  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       Home Medications Prior to Admission medications   Medication Sig Start Date End Date Taking? Authorizing Provider  acetaminophen (TYLENOL) 325 MG tablet Take 2 tablets (650 mg total) by mouth every 6 (six) hours as needed for up to 30 doses for mild pain. 12/07/19  Yes Trifan, Carola Rhine, MD  amoxicillin (AMOXIL) 500 MG capsule Take 1 capsule (500 mg total) by mouth 2 (two) times daily for 5 days. For UTI 09/03/21 09/08/21 Yes Emokpae, Courage, MD  ciprofloxacin (CIPRO) 500 MG tablet Take 1 tablet (500 mg total) by mouth daily for 5 days. For UTI 09/03/21 09/08/21 Yes Emokpae, Courage, MD  FEROSUL 325 (65 Fe) MG tablet Take 325 mg by mouth daily with breakfast. 07/07/21  Yes [provider]  mirtazapine (REMERON) 7.5 MG tablet Take 7.5 mg by mouth at bedtime. 08/11/21 09/10/21 Yes [provider]  amLODipine (NORVASC) 10 MG tablet Take 1 tablet (10 mg total) by mouth daily. For BP 09/03/21 09/03/22  Roxan Hockey, MD  aspirin EC 81 MG tablet Take 1 tablet (81 mg total) by mouth daily with breakfast.  09/03/21   Roxan Hockey, MD  atorvastatin (LIPITOR) 80 MG tablet Take 1 tablet (80 mg total) by mouth daily. 09/12/21   Roxan Hockey, MD  carvedilol (COREG) 6.25 MG tablet Take 1 tablet (6.25 mg total) by mouth 2 (two) times daily with a meal. For BP 09/03/21   Emokpae, Courage, MD  Cholecalciferol 25 MCG (1000 UT) tablet Take 2,000 Units by mouth daily.     [provider]  losartan-hydrochlorothiazide (HYZAAR) 50-12.5 MG tablet Take 1 tablet by mouth daily. For BP 09/03/21 09/03/22  Roxan Hockey, MD  Melatonin 3 MG CAPS Take 3 mg by mouth daily as needed (sleep).    [provider]  nicotine (NICODERM CQ - DOSED IN MG/24 HOURS) 21 mg/24hr patch Place 1 patch (21 mg total) onto the skin daily. 09/04/21   Roxan Hockey, MD      Allergies    Patient has no known allergies.    Review of Systems   Review of Systems  Constitutional:  Negative for chills and fever.  HENT:  Negative for ear pain and sore throat.   Eyes:  Negative for pain and visual disturbance.  Respiratory:  Negative for cough and shortness of breath.   Cardiovascular:  Negative for chest pain and palpitations.  Gastrointestinal:  Negative for abdominal pain and vomiting.  Genitourinary:  Negative for dysuria and hematuria.  Musculoskeletal:  Negative for arthralgias and back pain.  Skin:  Negative for  color change and rash.  Neurological:  Positive for dizziness. Negative for seizures and syncope.  Psychiatric/Behavioral:  Positive for confusion.   All other systems reviewed and are negative.   Physical Exam Updated Vital Signs BP (!) 145/72   Pulse 92   Temp 98.5 F (36.9 C)   Resp 20   Ht 1.778 m ('5\' 10"'$ )   Wt 65.8 kg   SpO2 100%   BMI 20.81 kg/m  Physical Exam Vitals and nursing note reviewed.  Constitutional:      General: He is not in acute distress.    Appearance: Normal appearance. He is well-developed.  HENT:     Head: Normocephalic and atraumatic.  Eyes:      Conjunctiva/sclera: Conjunctivae normal.     Pupils: Pupils are equal, round, and reactive to light.  Cardiovascular:     Rate and Rhythm: Normal rate and regular rhythm.     Heart sounds: No murmur heard. Pulmonary:     Effort: Pulmonary effort is normal. No respiratory distress.     Breath sounds: Normal breath sounds.  Abdominal:     Palpations: Abdomen is soft.     Tenderness: There is no abdominal tenderness. There is no guarding.  Musculoskeletal:        General: No swelling.     Cervical back: Normal range of motion and neck supple.  Skin:    General: Skin is warm and dry.     Capillary Refill: Capillary refill takes less than 2 seconds.  Neurological:     General: No focal deficit present.     Mental Status: He is alert and oriented to person, place, and time.     Cranial Nerves: No cranial nerve deficit.     Sensory: No sensory deficit.     Motor: No weakness.  Psychiatric:        Mood and Affect: Mood normal.     ED Results / Procedures / Treatments   Labs (all labs ordered are listed, but only abnormal results are displayed) Labs Reviewed  URINE CULTURE - Abnormal; Notable for the following components:      Result Value   Culture   (*)    Value: >=100,000 COLONIES/mL PSEUDOMONAS AERUGINOSA 10,000 COLONIES/mL ENTEROCOCCUS FAECIUM VANCOMYCIN RESISTANT ENTEROCOCCUS ISOLATED    Organism ID, Bacteria PSEUDOMONAS AERUGINOSA (*)    Organism ID, Bacteria ENTEROCOCCUS FAECIUM (*)    All other components within normal limits  COMPREHENSIVE METABOLIC PANEL - Abnormal; Notable for the following components:   Potassium 5.3 (*)    Chloride 113 (*)    CO2 20 (*)    Glucose, Bld 179 (*)    BUN 74 (*)    Creatinine, Ser 4.05 (*)    AST 67 (*)    ALT 95 (*)    GFR, Estimated 14 (*)    All other components within normal limits  LACTIC ACID, PLASMA - Abnormal; Notable for the following components:   Lactic Acid, Venous 2.9 (*)    All other components within normal limits   LACTIC ACID, PLASMA - Abnormal; Notable for the following components:   Lactic Acid, Venous 3.6 (*)    All other components within normal limits  CBC WITH DIFFERENTIAL/PLATELET - Abnormal; Notable for the following components:   WBC 19.0 (*)    RBC 3.53 (*)    Hemoglobin 9.7 (*)    HCT 30.0 (*)    Platelets 110 (*)    Neutro Abs 15.8 (*)    Monocytes Absolute 1.9 (*)  Abs Immature Granulocytes 0.23 (*)    All other components within normal limits  PROTIME-INR - Abnormal; Notable for the following components:   Prothrombin Time 15.3 (*)    All other components within normal limits  URINALYSIS, ROUTINE W REFLEX MICROSCOPIC - Abnormal; Notable for the following components:   APPearance CLOUDY (*)    Hgb urine dipstick MODERATE (*)    Protein, ur 100 (*)    Leukocytes,Ua LARGE (*)    Bacteria, UA RARE (*)    All other components within normal limits  APTT - Abnormal; Notable for the following components:   aPTT 37 (*)    All other components within normal limits  LACTIC ACID, PLASMA - Abnormal; Notable for the following components:   Lactic Acid, Venous 4.4 (*)    All other components within normal limits  LACTIC ACID, PLASMA - Abnormal; Notable for the following components:   Lactic Acid, Venous 2.5 (*)    All other components within normal limits  COMPREHENSIVE METABOLIC PANEL - Abnormal; Notable for the following components:   Chloride 115 (*)    CO2 21 (*)    BUN 66 (*)    Creatinine, Ser 3.07 (*)    Calcium 8.7 (*)    Total Protein 5.6 (*)    Albumin 2.7 (*)    AST 42 (*)    ALT 58 (*)    GFR, Estimated 20 (*)    All other components within normal limits  CBC - Abnormal; Notable for the following components:   WBC 11.2 (*)    RBC 3.27 (*)    Hemoglobin 8.8 (*)    HCT 27.0 (*)    Platelets 81 (*)    All other components within normal limits  COMPREHENSIVE METABOLIC PANEL - Abnormal; Notable for the following components:   Chloride 113 (*)    Glucose, Bld 118  (*)    BUN 52 (*)    Creatinine, Ser 2.67 (*)    Calcium 8.7 (*)    Total Protein 6.1 (*)    Albumin 2.9 (*)    AST 61 (*)    ALT 63 (*)    GFR, Estimated 23 (*)    All other components within normal limits  CBC - Abnormal; Notable for the following components:   RBC 3.73 (*)    Hemoglobin 10.2 (*)    HCT 30.6 (*)    Platelets 97 (*)    All other components within normal limits  CULTURE, BLOOD (ROUTINE X 2)  CULTURE, BLOOD (ROUTINE X 2)  RESP PANEL BY RT-PCR (FLU A&B, COVID) ARPGX2  LACTIC ACID, PLASMA  LACTIC ACID, PLASMA  MAGNESIUM  PHOSPHORUS  HEMOGLOBIN A1C    EKG EKG Interpretation  Date/Time:  Wednesday August 31 2021 19:21:11 EDT Ventricular Rate:  60 PR Interval:  224 QRS Duration: 148 QT Interval:  415 QTC Calculation: 415 R Axis:   69 Text Interpretation: Sinus or ectopic atrial rhythm Prolonged PR interval Right bundle branch block LVH with secondary repolarization abnormality Confirmed by Fredia Sorrow [phone]) on 08/31/2021 9:51:05 PM  Radiology No results found.  Procedures Procedures    Medications Ordered in ED Medications  lactated ringers infusion (0 mLs Intravenous Stopped 09/01/21 1930)  lactated ringers bolus 1,000 mL (0 mLs Intravenous Stopped 09/01/21 0030)    And  lactated ringers bolus 1,000 mL (0 mLs Intravenous Stopped 09/01/21 0030)  ceFEPIme (MAXIPIME) 2 g in sodium chloride 0.9 % 100 mL IVPB (0 g Intravenous Stopped 08/31/21 2315)  metroNIDAZOLE (FLAGYL) IVPB 500 mg (0 mg Intravenous Stopped 09/01/21 0030)  vancomycin (VANCOCIN) IVPB 1000 mg/200 mL premix (0 mg Intravenous Stopped 09/01/21 0141)  ceFEPIme (MAXIPIME) 2 g in sodium chloride 0.9 % 100 mL IVPB (0 g Intravenous Stopped 09/03/21 1322)  hydrALAZINE (APRESOLINE) injection 10 mg (10 mg Intravenous Given 09/03/21 1439)  fosfomycin (MONUROL) packet 3 g (3 g Oral Given 09/03/21 1504)    ED Course/ Medical Decision Making/ A&P                           Medical Decision  Making Amount and/or Complexity of Data Reviewed Labs: ordered. Radiology: ordered.  Risk Prescription drug management. Decision regarding hospitalization.   CRITICAL CARE Performed by: Fredia Sorrow Total critical care time: 40 minutes Critical care time was exclusive of separately billable procedures and treating other patients. Critical care was necessary to treat or prevent imminent or life-threatening deterioration. Critical care was time spent personally by me on the following activities: development of treatment plan with patient and/or surrogate as well as nursing, discussions with consultants, evaluation of patient's response to treatment, examination of patient, obtaining history from patient or surrogate, ordering and performing treatments and interventions, ordering and review of laboratory studies, ordering and review of radiographic studies, pulse oximetry and re-evaluation of patient's condition.  Patient's work-up seem to be more here for altered mental status.  However patient had marked leukocytosis and had elevated lactic acids.  So sepsis protocol was initiated.  The patient certainly hypertensive here.  Concerning for hypertensive urgency with the dizziness as well.  Patient's creatinine was 4.05 creatinine was 2.76 on July 6.  Lactic acids were 2.9 and 3.6 and 4.4.  Urine showed cloudy urine with proteinuria and large leukocytes and moderate hemoglobin and urine dipstick blood culture urine cultures pending.  CT abdomen and pelvis showed bladder thickening suggestive of cystitis.  This could be a possible cause of the altered mental status as well as infection.  No evidence of any hydronephrosis.  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  Will need follow-up.  CT head showed no acute changes.  And chest x-ray showed slight or special pattern of the lung bases no focal consolidation.  Patient was treated with IV cefepime Flagyl and Vanco and received IV  hydration.  Hospitalist consulted for admission for the continued IV hydration for the acute kidney injury.  Also severe sepsis.  Some mild hyperkalemia with a potassium of 5.3.    Most likely the sepsis secondary to UTI.  Final Clinical Impression(s) / ED Diagnoses Final diagnoses:  AKI (acute kidney injury) ([place])  Sepsis, due to unspecified organism, unspecified whether acute organ dysfunction present [facility])    Rx / DC Orders ED Discharge Orders          Ordered    atorvastatin (LIPITOR) 80 MG tablet  Daily        09/03/21 1524    nicotine (NICODERM CQ - DOSED IN MG/24 HOURS) 21 mg/24hr patch  Daily        09/03/21 1454    amLODipine (NORVASC) 10 MG tablet  Daily,   Status:  Discontinued        09/03/21 1454    aspirin EC 81 MG tablet  Daily with breakfast        09/03/21 1454    atorvastatin (LIPITOR) 80 MG tablet  Daily,   Status:  Discontinued        09/03/21 1454  carvedilol (COREG) 6.25 MG tablet  2 times daily with meals,   Status:  Discontinued        09/03/21 1454    losartan-hydrochlorothiazide (HYZAAR) 50-12.5 MG tablet  Daily,   Status:  Discontinued        09/03/21 1454    ciprofloxacin (CIPRO) 500 MG tablet  Daily        09/03/21 1454    amoxicillin (AMOXIL) 500 MG capsule  2 times daily        09/03/21 1454    Increase activity slowly        09/03/21 1454    Diet - low sodium heart healthy        09/03/21 1454    Discharge instructions  Status:  Canceled       Comments: 1)Avoid ibuprofen/Advil/Aleve/Motrin/Goody Powders/Naproxen/BC powders/Meloxicam/Diclofenac/Indomethacin and other Nonsteroidal anti-inflammatory medications as these will make you more likely to bleed and can cause stomach ulcers, can also cause Kidney problems.   2)Repeat CBC and BMP Blood Test with primary care physician in about 4 to 5 days from now  3)Please note that there has been several changes to your medications   09/03/21 1454    Call MD for:  temperature >100.4         09/03/21 1454    Call MD for:  persistant nausea and vomiting        09/03/21 1454    Call MD for:  difficulty breathing, headache or visual disturbances        09/03/21 1454    Call MD for:  persistant dizziness or light-headedness        09/03/21 1454    carvedilol (COREG) 6.25 MG tablet  2 times daily with meals        09/03/21 1456    losartan-hydrochlorothiazide (HYZAAR) 50-12.5 MG tablet  Daily        09/03/21 1456    amLODipine (NORVASC) 10 MG tablet  Daily        09/03/21 [place]        09/03/21 1459    Face-to-face encounter (required for Medicare/Medicaid patients)       Comments: I Courage Emokpae certify that this patient is under my care and that I, or a nurse practitioner or physician's assistant working with me, had a face-to-face encounter that meets the physician face-to-face encounter requirements with this patient on 09/03/2021. The encounter with the patient was in whole, or in part for the following medical condition(s) which is the primary reason for home health care (List medical condition):  - Home Health RN For medication compliance cardiopulmonary assessment   09/03/21 1459    Discharge instructions       Comments: 1)Avoid ibuprofen/Advil/Aleve/Motrin/Goody Powders/Naproxen/BC powders/Meloxicam/Diclofenac/Indomethacin and other Nonsteroidal anti-inflammatory medications as these will make you more likely to bleed and can cause stomach ulcers, can also cause Kidney problems.   2)Repeat CBC and BMP Blood Test with primary care physician in about 4 to 5 days from now  3)Please note that there has been several changes to your medications   09/03/21 1504              Fredia Sorrow, MD 09/06/21 1803

## 2021-08-31 NOTE — ED Triage Notes (Signed)
Patient presents to ED via [facility], states patient has altered mental status.Alert and oriented to person. Patient has a history of  bladder cancer. EMS states that patient assisted to bathroom noted milky colored urine. Patient denies pain. No acute distress  noted.

## 2021-09-01 DIAGNOSIS — A4181 Sepsis due to Enterococcus: Secondary | ICD-10-CM | POA: Diagnosis present

## 2021-09-01 DIAGNOSIS — R4182 Altered mental status, unspecified: Secondary | ICD-10-CM | POA: Diagnosis present

## 2021-09-01 DIAGNOSIS — E782 Mixed hyperlipidemia: Secondary | ICD-10-CM

## 2021-09-01 DIAGNOSIS — N179 Acute kidney failure, unspecified: Secondary | ICD-10-CM | POA: Diagnosis present

## 2021-09-01 DIAGNOSIS — J9601 Acute respiratory failure with hypoxia: Secondary | ICD-10-CM | POA: Diagnosis present

## 2021-09-01 DIAGNOSIS — N184 Chronic kidney disease, stage 4 (severe): Secondary | ICD-10-CM | POA: Diagnosis present

## 2021-09-01 DIAGNOSIS — N189 Chronic kidney disease, unspecified: Secondary | ICD-10-CM

## 2021-09-01 DIAGNOSIS — I10 Essential (primary) hypertension: Secondary | ICD-10-CM

## 2021-09-01 DIAGNOSIS — N390 Urinary tract infection, site not specified: Secondary | ICD-10-CM | POA: Diagnosis present

## 2021-09-01 DIAGNOSIS — R7401 Elevation of levels of liver transaminase levels: Secondary | ICD-10-CM

## 2021-09-01 DIAGNOSIS — Z8551 Personal history of malignant neoplasm of bladder: Secondary | ICD-10-CM | POA: Diagnosis not present

## 2021-09-01 DIAGNOSIS — I129 Hypertensive chronic kidney disease with stage 1 through stage 4 chronic kidney disease, or unspecified chronic kidney disease: Secondary | ICD-10-CM | POA: Diagnosis present

## 2021-09-01 DIAGNOSIS — Z79899 Other long term (current) drug therapy: Secondary | ICD-10-CM | POA: Diagnosis not present

## 2021-09-01 DIAGNOSIS — J449 Chronic obstructive pulmonary disease, unspecified: Secondary | ICD-10-CM | POA: Diagnosis present

## 2021-09-01 DIAGNOSIS — G4700 Insomnia, unspecified: Secondary | ICD-10-CM

## 2021-09-01 DIAGNOSIS — D696 Thrombocytopenia, unspecified: Secondary | ICD-10-CM | POA: Diagnosis present

## 2021-09-01 DIAGNOSIS — A4152 Sepsis due to Pseudomonas: Secondary | ICD-10-CM | POA: Diagnosis present

## 2021-09-01 DIAGNOSIS — R6520 Severe sepsis without septic shock: Secondary | ICD-10-CM | POA: Diagnosis present

## 2021-09-01 DIAGNOSIS — R739 Hyperglycemia, unspecified: Secondary | ICD-10-CM

## 2021-09-01 DIAGNOSIS — A419 Sepsis, unspecified organism: Secondary | ICD-10-CM

## 2021-09-01 DIAGNOSIS — D631 Anemia in chronic kidney disease: Secondary | ICD-10-CM | POA: Diagnosis present

## 2021-09-01 DIAGNOSIS — E875 Hyperkalemia: Secondary | ICD-10-CM | POA: Diagnosis present

## 2021-09-01 DIAGNOSIS — Z20822 Contact with and (suspected) exposure to covid-19: Secondary | ICD-10-CM | POA: Diagnosis present

## 2021-09-01 DIAGNOSIS — F17210 Nicotine dependence, cigarettes, uncomplicated: Secondary | ICD-10-CM | POA: Diagnosis present

## 2021-09-01 DIAGNOSIS — Z7982 Long term (current) use of aspirin: Secondary | ICD-10-CM | POA: Diagnosis not present

## 2021-09-01 DIAGNOSIS — I2510 Atherosclerotic heart disease of native coronary artery without angina pectoris: Secondary | ICD-10-CM | POA: Diagnosis present

## 2021-09-01 LAB — RESP PANEL BY RT-PCR (FLU A&B, COVID) ARPGX2
Influenza A by PCR: NEGATIVE
Influenza B by PCR: NEGATIVE
SARS Coronavirus 2 by RT PCR: NEGATIVE

## 2021-09-01 LAB — PHOSPHORUS: Phosphorus: 3.8 mg/dL (ref 2.5–4.6)

## 2021-09-01 LAB — MAGNESIUM: Magnesium: 2 mg/dL (ref 1.7–2.4)

## 2021-09-01 LAB — HEMOGLOBIN A1C
Hgb A1c MFr Bld: 5.4 % (ref 4.8–5.6)
Mean Plasma Glucose: 108.28 mg/dL

## 2021-09-01 LAB — LACTIC ACID, PLASMA
Lactic Acid, Venous: 4.4 mmol/L (ref 0.5–1.9)
Lactic Acid, Venous: 2.5 mmol/L (ref 0.5–1.9)
Lactic Acid, Venous: 1.4 mmol/L (ref 0.5–1.9)
Lactic Acid, Venous: 1.3 mmol/L (ref 0.5–1.9)

## 2021-09-01 MED ORDER — QUINAPRIL HCL 10 MG PO TABS
40.0000 mg | ORAL_TABLET | Freq: Every day | ORAL | Status: DC
  Filled 2021-09-01 (×2): qty 4

## 2021-09-01 MED ORDER — NICOTINE 21 MG/24HR TD PT24
21.0000 mg | MEDICATED_PATCH | Freq: Every day | TRANSDERMAL | Status: DC
  Administered 2021-09-01 – 2021-09-03 (×3): 21 mg via TRANSDERMAL
  Filled 2021-09-01 (×3): qty 1

## 2021-09-01 MED ORDER — AMLODIPINE BESYLATE 5 MG PO TABS
10.0000 mg | ORAL_TABLET | Freq: Every day | ORAL | Status: DC
  Administered 2021-09-01 – 2021-09-03 (×3): 10 mg via ORAL
  Filled 2021-09-01 (×3): qty 2

## 2021-09-01 MED ORDER — DM-GUAIFENESIN ER 30-600 MG PO TB12
1.0000 | ORAL_TABLET | Freq: Two times a day (BID) | ORAL | Status: DC
  Administered 2021-09-01 – 2021-09-03 (×5): 1 via ORAL
  Filled 2021-09-01 (×5): qty 1

## 2021-09-01 MED ORDER — ALBUTEROL SULFATE (2.5 MG/3ML) 0.083% IN NEBU
2.5000 mg | INHALATION_SOLUTION | RESPIRATORY_TRACT | Status: DC | PRN
  Administered 2021-09-02: 2.5 mg via RESPIRATORY_TRACT
  Filled 2021-09-01: qty 3

## 2021-09-01 MED ORDER — IPRATROPIUM-ALBUTEROL 0.5-2.5 (3) MG/3ML IN SOLN
3.0000 mL | Freq: Three times a day (TID) | RESPIRATORY_TRACT | Status: DC
  Administered 2021-09-01 – 2021-09-03 (×4): 3 mL via RESPIRATORY_TRACT
  Filled 2021-09-01 (×5): qty 3

## 2021-09-01 MED ORDER — ENOXAPARIN SODIUM 30 MG/0.3ML IJ SOSY
30.0000 mg | PREFILLED_SYRINGE | INTRAMUSCULAR | Status: DC
  Administered 2021-09-01: 30 mg via SUBCUTANEOUS
  Filled 2021-09-01 (×2): qty 0.3

## 2021-09-01 MED ORDER — ATORVASTATIN CALCIUM 40 MG PO TABS
80.0000 mg | ORAL_TABLET | Freq: Every day | ORAL | Status: DC
  Administered 2021-09-01 – 2021-09-03 (×3): 80 mg via ORAL
  Filled 2021-09-01 (×3): qty 2

## 2021-09-01 MED ORDER — HYDRALAZINE HCL 20 MG/ML IJ SOLN
10.0000 mg | Freq: Four times a day (QID) | INTRAMUSCULAR | Status: DC | PRN
  Administered 2021-09-02: 10 mg via INTRAVENOUS
  Filled 2021-09-01: qty 1

## 2021-09-01 MED ORDER — CEFEPIME HCL 2 G IV SOLR
2.0000 g | INTRAVENOUS | Status: DC
  Administered 2021-09-01 – 2021-09-02 (×2): 2 g via INTRAVENOUS
  Filled 2021-09-01 (×2): qty 12.5

## 2021-09-01 MED ORDER — LISINOPRIL 10 MG PO TABS
40.0000 mg | ORAL_TABLET | Freq: Every day | ORAL | Status: DC
  Administered 2021-09-01 – 2021-09-03 (×3): 40 mg via ORAL
  Filled 2021-09-01 (×3): qty 4

## 2021-09-01 MED ORDER — VANCOMYCIN HCL 750 MG/150ML IV SOLN: 750.0000 mg | INTRAVENOUS | Status: DC

## 2021-09-01 MED ORDER — MELATONIN 3 MG PO TABS
3.0000 mg | ORAL_TABLET | Freq: Every evening | ORAL | Status: DC | PRN
Start: 2021-09-01 — End: 2021-09-04
  Administered 2021-09-02: 3 mg via ORAL
  Filled 2021-09-01: qty 1

## 2021-09-01 NOTE — ED Provider Notes (Incomplete)
[facility] EMERGENCY DEPARTMENT Provider Note   CSN: [id number] Arrival date & time: 08/31/21  1801     History {Add pertinent medical, surgical, social history, OB history to HPI:1} Chief Complaint  Patient presents with  . Altered Mental Status    Isaac Hunter is a 81 y.o. male.  Patient brought in from home.  Patient presenting with concerns for sepsis.       Home Medications Prior to Admission medications   Medication Sig Start Date End Date Taking? Authorizing Provider  acetaminophen (TYLENOL) 325 MG tablet Take 2 tablets (650 mg total) by mouth every 6 (six) hours as needed for up to 30 doses for mild pain. Patient not taking: Reported on 12/08/2019 12/07/19   Wyvonnia Dusky, MD  acetaminophen (TYLENOL) 500 MG tablet Take 500 mg by mouth every 6 (six) hours as needed.    [provider]  amLODipine (NORVASC) 10 MG tablet Take 1 tablet by mouth daily. 08/12/21 08/12/22  [provider]  amLODipine (NORVASC) 5 MG tablet Take 1 tablet (5 mg total) by mouth daily. 11/23/10 12/08/19  Delfina Redwood, MD  aspirin EC 81 MG tablet Take 1 tablet by mouth daily.    [provider]  atorvastatin (LIPITOR) 80 MG tablet Take by mouth.    [provider]  cephALEXin (KEFLEX) 500 MG capsule Take 1 capsule (500 mg total) by mouth 3 (three) times daily. 08/12/21   Veryl Speak, MD  chlorthalidone (HYGROTEN) 15 MG tablet Take by mouth.    [provider]  chlorthalidone (HYGROTON) 25 MG tablet Take 25 mg by mouth every morning. 08/08/19   [provider]  Cholecalciferol (VITAMIN D-1000 MAX ST) 25 MCG (1000 UT) tablet Take by mouth.    [provider]  Cholecalciferol 25 MCG (1000 UT) tablet Take 2,000 Units by mouth daily.     [provider]  clopidogrel (PLAVIX) 75 MG tablet Take by mouth. Patient not taking: Reported on 12/08/2019    [provider]  FEROSUL 325 (65 Fe) MG tablet Take by mouth. 07/07/21    [provider]  GNP ASPIRIN LOW DOSE 81 MG EC tablet Take 81 mg by mouth daily. 09/02/19   [provider]  ibuprofen (ADVIL) 400 MG tablet Take 1 tablet (400 mg total) by mouth every 8 (eight) hours as needed for up to 20 doses for mild pain or moderate pain. 12/07/19   Wyvonnia Dusky, MD  lisinopril (ZESTRIL) 40 MG tablet Take 40 mg by mouth daily.    [provider]  Melatonin 3 MG CAPS Take by mouth.    [provider]  mirtazapine (REMERON) 7.5 MG tablet Take 7.5 mg by mouth at bedtime. 08/11/21 09/10/21  [provider]  NIFEdipine (ADALAT CC) 30 MG 24 hr tablet SMARTSIG:1 Tablet(s) By Mouth Daily 08/08/19   [provider]  NIFEdipine (ADALAT CC) 30 MG 24 hr tablet Take 1 tablet by mouth daily. 08/08/19   [provider]  quinapril (ACCUPRIL) 40 MG tablet Take 40 mg by mouth daily.    [provider]      Allergies    Patient has no known allergies.    Review of Systems   Review of Systems  Physical Exam Updated Vital Signs BP (!) 131/58   Pulse 64   Temp 98.4 F (36.9 C) (Oral)   Resp (!) 22   Ht 1.778 m ('5\' 10"'$ )   Wt 65.8 kg   SpO2 99%  BMI 20.81 kg/m  Physical Exam  ED Results / Procedures / Treatments   Labs (all labs ordered are listed, but only abnormal results are displayed) Labs Reviewed  COMPREHENSIVE METABOLIC PANEL - Abnormal; Notable for the following components:      Result Value   Potassium 5.3 (*)    Chloride 113 (*)    CO2 20 (*)    Glucose, Bld 179 (*)    BUN 74 (*)    Creatinine, Ser 4.05 (*)    AST 67 (*)    ALT 95 (*)    GFR, Estimated 14 (*)    All other components within normal limits  LACTIC ACID, PLASMA - Abnormal; Notable for the following components:   Lactic Acid, Venous 2.9 (*)    All other components within normal limits  CBC WITH DIFFERENTIAL/PLATELET - Abnormal; Notable for the following components:   WBC 19.0 (*)    RBC 3.53 (*)    Hemoglobin 9.7 (*)     HCT 30.0 (*)    Platelets 110 (*)    Neutro Abs 15.8 (*)    Monocytes Absolute 1.9 (*)    Abs Immature Granulocytes 0.23 (*)    All other components within normal limits  PROTIME-INR - Abnormal; Notable for the following components:   Prothrombin Time 15.3 (*)    All other components within normal limits  CULTURE, BLOOD (ROUTINE X 2)  CULTURE, BLOOD (ROUTINE X 2)  LACTIC ACID, PLASMA  URINALYSIS, ROUTINE W REFLEX MICROSCOPIC    EKG None  Radiology DG Chest Port 1 View  Result Date: 08/31/2021 CLINICAL DATA:  Altered mental status.  History of bladder cancer. EXAM: PORTABLE CHEST 1 VIEW COMPARISON:  11/18/2010 FINDINGS: Heart size and pulmonary vascularity are normal. 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 No airspace disease or consolidation. No pleural effusions. No pneumothorax. Mediastinal contours appear intact. Calcification of the aorta. Degenerative changes in the spine. 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 No focal consolidation. Electronically Signed   By: Lucienne Capers M.D.   On: 08/31/2021 19:37    Procedures Procedures  {Document cardiac monitor, telemetry assessment procedure when appropriate:1}  Medications Ordered in ED Medications - No data to display  ED Course/ Medical Decision Making/ A&P                           Medical Decision Making Amount and/or Complexity of Data Reviewed Labs: ordered. Radiology: ordered.  Risk Prescription drug management. Decision regarding hospitalization.   ***  {Document critical care time when appropriate:1} {Document review of labs and clinical decision tools ie heart score, Chads2Vasc2 etc:1}  {Document your independent review of radiology images, and any outside records:1} {Document your discussion with family members, caretakers, and with consultants:1} {Document social determinants of health affecting pt's care:1} {Document your decision  making why or why not admission, treatments were needed:1} Final Clinical Impression(s) / ED Diagnoses Final diagnoses:  None    Rx / DC Orders ED Discharge Orders     None

## 2021-09-01 NOTE — ED Notes (Signed)
Provider at bedside.  Discussed pt's tachypnea and lactic acid results

## 2021-09-01 NOTE — ED Notes (Signed)
Lab at bedside

## 2021-09-01 NOTE — TOC Progression Note (Signed)
  Transition of Care [facility]) Screening Note   Patient Details  Name: Isaac Hunter Date of Birth: [birth year]   [facility]) CM/SW Contact:    Boneta Lucks, RN Phone Number: 09/01/2021, 11:09 AM    Transition of Care Department [facility]) has reviewed patient and no TOC needs have been identified at this time. We will continue to monitor patient advancement through interdisciplinary progression rounds. If new patient transition needs arise, please place a TOC consult.     Barriers to Discharge: Continued Medical Work up

## 2021-09-01 NOTE — H&P (Signed)
History and Physical    Patient: Isaac Hunter:[id number] DOB: [birth year] DOA: 08/31/2021 DOS: the patient was seen and examined on 09/01/2021 PCP: [facility] [employer]  Patient coming from: Home  Chief Complaint:  Chief Complaint  Patient presents with   Altered Mental Status   HPI: Isaac Hunter is a 81 y.o. male with medical history significant of CKD stage IV, bladder cancer s/p resection (2012), thrombocytopenia, CAD, hypertension, hyperlipidemia, anemia of chronic disease who presents to the emergency via EMS due to altered mental status.  Patient was alert and oriented to person at bedside, but was unable to provide any history, history was obtained from ED physician and ED medical record.  Per report, EMS was activated due to noted change in patient's mental status (alert and oriented x3 at baseline), on arrival of EMS team, he was assisted to bathroom and patient was noted with milky colored urine.  He denies pain or any acute distress.  ED Course:  In the emergency department, he was intermittently tachypneic, BP was 144/66, but other vital signs were within normal range.  Work-up in the ED leukocytosis, normocytic anemia, thrombocytopenia.  BMP showed sodium 142, potassium 5.3, chloride 113, bicarb 20, glucose 179, BUN 74, creatinine 4.05 (creatinine was 2.76 on 08/11/2021).  Lactic acid 2.9 > 3.6 > 4.4 > 2.5.  Urinalysis showed cloudy urine with proteinuria and large leukocytes and moderate hemoglobin in urine dipstick.  Blood culture was pending. CT abdomen and pelvis without contrast showed 1. Indistinct wall thickening of the urinary bladder with mild perivesical stranding, suggestive of cystitis. There is no evidence for hydronephrosis. There are possible punctate intrarenal stones bilaterally. 2. 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 Recommend follow-up CT/MR every 6 months and vascular consultation CT head without contrast showed no  acute intracranial abnormality Chest x-ray showed slight interstitial pattern to the lung bases, likely fibrosis. No focal consolidation. Patient was treated with IV cefepime, Flagyl and vancomycin.  IV hydration was provided.  Hospitalist was asked to admit patient for further evaluation and management  Review of Systems: Review of systems as noted in the HPI. All other systems reviewed and are negative.   Past Medical History:  Diagnosis Date   CAD (coronary artery disease)    Non obstructive per cath 2001.   Diabetes mellitus    hx per pt, no longer has?   Hypertension    hx per pt, no long has??   Tobacco abuse 11/17/2010   Past Surgical History:  Procedure Laterality Date   CYSTOSCOPY  11/21/2010   Procedure: CYSTOSCOPY;  Surgeon: Marissa Nestle;  Location: [facility];  Service: Urology;  Laterality: N/A;   Left hand surgery     TRANSURETHRAL RESECTION OF BLADDER TUMOR  11/21/2010   Procedure: TRANSURETHRAL RESECTION OF BLADDER TUMOR (TURBT);  Surgeon: Marissa Nestle;  Location: [facility];  Service: Urology;  Laterality: N/A;  evacuation of clots    Social History:  reports that he has been smoking cigarettes. He has a 50.00 pack-year smoking history. He has never used smokeless tobacco. He reports that he does not drink alcohol and does not use drugs.   No Known Allergies  Family History  Problem Relation Age of Onset   Anesthesia problems Neg Hx    Hypotension Neg Hx    Malignant hyperthermia Neg Hx    Pseudochol deficiency Neg Hx      Prior to Admission medications   Medication Sig Start Date End Date  Taking? Authorizing Provider  acetaminophen (TYLENOL) 325 MG tablet Take 2 tablets (650 mg total) by mouth every 6 (six) hours as needed for up to 30 doses for mild pain. Patient not taking: Reported on 12/08/2019 12/07/19   Wyvonnia Dusky, MD  acetaminophen (TYLENOL) 500 MG tablet Take 500 mg by mouth every 6 (six) hours as needed.    [provider]   amLODipine (NORVASC) 10 MG tablet Take 1 tablet by mouth daily. 08/12/21 08/12/22  [provider]  amLODipine (NORVASC) 5 MG tablet Take 1 tablet (5 mg total) by mouth daily. 11/23/10 12/08/19  Delfina Redwood, MD  aspirin EC 81 MG tablet Take 1 tablet by mouth daily.    [provider]  atorvastatin (LIPITOR) 80 MG tablet Take by mouth.    [provider]  cephALEXin (KEFLEX) 500 MG capsule Take 1 capsule (500 mg total) by mouth 3 (three) times daily. 08/12/21   Veryl Speak, MD  chlorthalidone (HYGROTEN) 15 MG tablet Take by mouth.    [provider]  chlorthalidone (HYGROTON) 25 MG tablet Take 25 mg by mouth every morning. 08/08/19   [provider]  Cholecalciferol (VITAMIN D-1000 MAX ST) 25 MCG (1000 UT) tablet Take by mouth.    [provider]  Cholecalciferol 25 MCG (1000 UT) tablet Take 2,000 Units by mouth daily.     [provider]  clopidogrel (PLAVIX) 75 MG tablet Take by mouth. Patient not taking: Reported on 12/08/2019    [provider]  FEROSUL 325 (65 Fe) MG tablet Take by mouth. 07/07/21   [provider]  GNP ASPIRIN LOW DOSE 81 MG EC tablet Take 81 mg by mouth daily. 09/02/19   [provider]  ibuprofen (ADVIL) 400 MG tablet Take 1 tablet (400 mg total) by mouth every 8 (eight) hours as needed for up to 20 doses for mild pain or moderate pain. 12/07/19   Wyvonnia Dusky, MD  lisinopril (ZESTRIL) 40 MG tablet Take 40 mg by mouth daily.    [provider]  Melatonin 3 MG CAPS Take by mouth.    [provider]  mirtazapine (REMERON) 7.5 MG tablet Take 7.5 mg by mouth at bedtime. 08/11/21 09/10/21  [provider]  NIFEdipine (ADALAT CC) 30 MG 24 hr tablet SMARTSIG:1 Tablet(s) By Mouth Daily 08/08/19   [provider]  NIFEdipine (ADALAT CC) 30 MG 24 hr tablet Take 1 tablet by mouth daily. 08/08/19   [provider]  quinapril (ACCUPRIL) 40 MG tablet Take 40  mg by mouth daily.    [provider]    Physical Exam: BP (!) 168/81 (BP Location: Left Arm)   Pulse 78   Temp 98.5 F (36.9 C)   Resp 18   Ht 5' 10" (1.778 m)   Wt 65.8 kg   SpO2 99%   BMI 20.81 kg/m   General: 81 y.o. year-old male ill appearing, but in no acute distress.  Alert and oriented x3. HEENT: NCAT, EOMI Neck: Supple, trachea medial Cardiovascular: Regular rate and rhythm with no rubs or gallops.  No thyromegaly or JVD noted.  No lower extremity edema. 2/4 pulses in all 4 extremities. Respiratory: Clear to auscultation with no wheezes or rales. Good inspiratory effort. Abdomen: Soft, nontender nondistended with normal bowel sounds x4 quadrants. Muskuloskeletal: No cyanosis, clubbing or edema noted bilaterally Neuro: No focal neurologic deficit, sensation, reflexes intact Skin: No ulcerative lesions noted or rashes Psychiatry: Mood is appropriate for condition and setting  Labs on Admission:  Basic Metabolic Panel: Recent Labs  Lab 08/31/21 2015  NA 142  K 5.3*  CL 113*  CO2 20*  GLUCOSE 179*  BUN 74*  CREATININE 4.05*  CALCIUM 9.3   Liver Function Tests: Recent Labs  Lab 08/31/21 2015  AST 67*  ALT 95*  ALKPHOS 70  BILITOT 0.8  PROT 7.3  ALBUMIN 3.6   No results for input(s): "LIPASE", "AMYLASE" in the last 168 hours. No results for input(s): "AMMONIA" in the last 168 hours. CBC: Recent Labs  Lab 08/31/21 2015  WBC 19.0*  NEUTROABS 15.8*  HGB 9.7*  HCT 30.0*  MCV 85.0  PLT 110*   Cardiac Enzymes: No results for input(s): "CKTOTAL", "CKMB", "CKMBINDEX", "TROPONINI" in the last 168 hours.  BNP (last 3 results) No results for input(s): "BNP" in the last 8760 hours.  ProBNP (last 3 results) No results for input(s): "PROBNP" in the last 8760 hours.  CBG: No results for input(s): "GLUCAP" in the last 168 hours.  Radiological Exams on Admission: CT Abdomen Pelvis Wo Contrast  Result Date: 08/31/2021 CLINICAL DATA:   Sepsis altered EXAM: CT ABDOMEN AND PELVIS WITHOUT CONTRAST TECHNIQUE: Multidetector CT imaging of the abdomen and pelvis was performed following the standard protocol without IV contrast. RADIATION DOSE REDUCTION: This exam was performed according to the departmental dose-optimization program which includes automated exposure control, adjustment of the mA and/or kV according to patient size and/or use of iterative reconstruction technique. COMPARISON:  None Available. FINDINGS: Lower chest: Motion degradation. Mild lower lobe bronchial wall thickening. No acute airspace disease or pleural effusion. Cardiomegaly. Coronary vascular calcification. Hepatobiliary: Liver granuloma. No calcified gallstone or biliary dilatation Pancreas: Unremarkable. No pancreatic ductal dilatation or surrounding inflammatory changes. Spleen: Normal in size without focal abnormality. Adrenals/Urinary Tract: Thickened enlarged adrenal glands left greater than right without discrete measurable mass. Kidneys show no hydronephrosis. Intrarenal vascular calcifications. Possible punctate bilateral kidney stones. Thick-walled appearance of the bladder with perivesical stranding and indistinct wall. Stomach/Bowel: The stomach is nonenlarged. No dilated small bowel. No acute bowel wall thickening. Negative appendix. Vascular/Lymphatic: Advanced aortic atherosclerosis. Aneurysmal dilatation of the distal infrarenal abdominal aorta measuring up to 4.6 cm. No suspicious lymph nodes Reproductive: Enlarged prostate Other: Negative for pelvic effusion or free air. Musculoskeletal: No acute osseous abnormality IMPRESSION: 1. Indistinct wall thickening of the urinary bladder with mild perivesical stranding, suggestive of cystitis. There is no evidence for hydronephrosis. There are possible punctate intrarenal stones bilaterally. 2. 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 Recommend follow-up CT/MR every 6 months and vascular  consultation. This recommendation follows ACR consensus guidelines: White Paper of the ACR Incidental Findings Committee II on Vascular Findings. J Am Coll Radiol 2013; 10:789-794. 3. Enlarged prostate Electronically Signed   By: Donavan Foil M.D.   On: 08/31/2021 23:11   CT Head Wo Contrast  Result Date: 08/31/2021 CLINICAL DATA:  Mental status change, unknown cause EXAM: CT HEAD WITHOUT CONTRAST TECHNIQUE: Contiguous axial images were obtained from the base of the skull through the vertex without intravenous contrast. RADIATION DOSE REDUCTION: This exam was performed according to the departmental dose-optimization program which includes automated exposure control, adjustment of the mA and/or kV according to patient size and/or use of iterative reconstruction technique. COMPARISON:  CT head 08/11/2021, CT head 12/07/2019 BRAIN: BRAIN Cerebral ventricle sizes are concordant with the degree of cerebral volume loss. Patchy and confluent areas of decreased attenuation are noted throughout the deep and periventricular white matter of the cerebral  hemispheres bilaterally, compatible with chronic microvascular ischemic disease. Right occipital lobe chronic infarction. No evidence of large-territorial acute infarction. No parenchymal hemorrhage. No mass lesion. No extra-axial collection. No mass effect or midline shift. No hydrocephalus. Basilar cisterns are patent. Vascular: No hyperdense vessel. Skull: No acute fracture or focal lesion. Sinuses/Orbits: Paranasal sinuses and mastoid air cells are clear. The orbits are unremarkable. Other: None. IMPRESSION: No acute intracranial abnormality. Electronically Signed   By: Iven Finn M.D.   On: 08/31/2021 23:10   DG Chest Port 1 View  Result Date: 08/31/2021 CLINICAL DATA:  Altered mental status.  History of bladder cancer. EXAM: PORTABLE CHEST 1 VIEW COMPARISON:  11/18/2010 FINDINGS: Heart size and pulmonary vascularity are normal. Mild interstitial infiltrates  in the lung bases, likely chronic fibrosis. No airspace disease or consolidation. No pleural effusions. No pneumothorax. Mediastinal contours appear intact. Calcification of the aorta. Degenerative changes in the spine. 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 No focal consolidation. Electronically Signed   By: Lucienne Capers M.D.   On: 08/31/2021 19:37    EKG: I independently viewed the EKG done and my findings are as followed: Sinus or ectopic atrial rhythm at a rate of 60 bpm with RBBB and LVH with secondary repolarization abnormality  Assessment/Plan Present on Admission:  Thrombocytopenia ([place])  Principal Problem:   Severe sepsis ([place]) Active Problems:   Thrombocytopenia ([place])   Essential hypertension   Hyperkalemia   Hyperglycemia   Transaminasemia   Acute kidney injury superimposed on chronic kidney disease ([place])   Mixed hyperlipidemia   Insomnia  Severe sepsis secondary to UTI Patient met sepsis criteria on arrival to the ED, he presented with leukocytosis and tachypnea (met SIRS criteria) and source of infection was UTI (met sepsis criteria). Lactic acid was elevated-2.9 > 3.6 > 4.4 > 2.5 - Met severe sepsis criteria, continue to trend lactic acid Patient was started on empiric IV antibiotics with IV vancomycin, cefepime and Flagyl, IV hydration per sepsis protocol was provided Continue IV vancomycin and cefepime Blood culture and urine culture pending  Hyperkalemia K+ 5.3, IV hydration was provided Potassium will be rechecked; continue to monitor potassium level  Hyperglycemia CBG 179, hemoglobin A1c was 6.5 about 10 years ago Hemoglobin A1c will be checked and patient will be treated accordingly  Transaminasemia possibly due to shock liver AST 67, ALT 95 Continue to monitor liver enzymes  Acute kidney injury on CKD stage IV Creatinine 4.05 (creatinine was 2.76 on 08/11/2021) Continue gentle hydration Renally adjust medications,  avoid nephrotoxic agents/dehydration/hypotension  Chronic thrombocytopenia Platelets 110, continue to monitor platelet levels  Essential hypertension (controlled)  Mixed hyperlipidemia Anemia of chronic disease  Insomnia Continue melatonin   DVT prophylaxis: Lovenox  Code Status: Full code  Consults: None  Family Communication: None at bedside  Severity of Illness: The appropriate patient status for this patient is INPATIENT. Inpatient status is judged to be reasonable and necessary in order to provide the required intensity of service to ensure the patient's safety. The patient's presenting symptoms, physical exam findings, and initial radiographic and laboratory data in the context of their chronic comorbidities is felt to place them at high risk for further clinical deterioration. Furthermore, it is not anticipated that the patient will be medically stable for discharge from the hospital within 2 midnights of admission.   * I certify that at the point of admission it is my clinical judgment that the patient will require inpatient hospital care spanning beyond 2 midnights from  the point of admission due to high intensity of service, high risk for further deterioration and high frequency of surveillance required.*  Author: Bernadette Hoit, DO 09/01/2021 6:19 AM  For on call review [email or web address].

## 2021-09-01 NOTE — Progress Notes (Signed)
Patient seen and evaluated, chart reviewed, please see EMR for updated orders. Please see full H&P dictated by admitting physician Dr. Josephine Cables for same date of service.    Brief Summary:-  81 y.o. male with medical history significant of CKD stage IV, bladder cancer s/p resection (2012), chronic thrombocytopenia, CAD, HLD, HTN and chronic  anemia of chronic disease admitted on 09/01/2021 with sepsis secondary to presumed urinary source as well as acute hypoxic respiratory failure in the setting of ongoing tobacco abuse  A/p 1)Sepsis secondary to presumed urinary source--POA -WBC 19.0 on admission -Lactic acid is down to 1.3 from 4.4 after IV fluids -Continue Vanco and cefepime pending further culture data -Okay to stop IV fluids after current bag  2)HTN--continue lisinopril 40 mg daily and amlodipine 10 mg daily, IV hydralazine as needed elevated BP  3) acute hypoxic respiratory failure/COPD/Tobacco abuse--- bronchodilators and mucolytics as ordered -Try to wean off O2 -Patient smokes >> 2 packs/day -Give nicotine patch  4)AKI----acute kidney injury on CKD stage - IV   -- creatinine on admission= 4.05  ,  -baseline creatinine = 2.2 to 2.7 (in care everywhere)   ,  -renally adjust medications, avoid nephrotoxic agents / dehydration  / hypotension  5)Elevated LFTs--- query related to sepsis/infection --Monitor closely  6)Chronic anemia/chronic thrombocytopenia----currently close to baseline  7)HLD--- continue Lipitor  - Total care time is 54 minutes  Patient seen and evaluated, chart reviewed, please see EMR for updated orders. Please see full H&P dictated by admitting physician Dr. Josephine Cables for same date of service.   Roxan Hockey, MD

## 2021-09-01 NOTE — Progress Notes (Signed)
Pharmacy Antibiotic Note  Isaac Hunter is a 81 y.o. male admitted on 08/31/2021 with sepsis.  Pharmacy has been consulted for Vancomycin/Cefepime dosing. WBC is elevated. Noted renal dysfunction.   Plan: Vancomycin 750 mg IV q48h >>>Estimated AUC: 480 Cefepime 2g IV q24h Trend WBC, temp, renal function  F/U infectious work-up Drug levels as indicated   Height: '5\' 10"'$  (177.8 cm) Weight: 65.8 kg (145 lb 1 oz) IBW/kg (Calculated) : 73  Temp (24hrs), Avg:99.3 F (37.4 C), Min:98.4 F (36.9 C), Max:100 F (37.8 C)  Recent Labs  Lab 08/31/21 1915 08/31/21 2015 08/31/21 2129 08/31/21 2315 09/01/21 0134  WBC  --  19.0*  --   --   --   CREATININE  --  4.05*  --   --   --   LATICACIDVEN 2.9*  --  3.6* 4.4* 2.5*    Estimated Creatinine Clearance: 13.1 mL/min (A) (by C-G formula based on SCr of 4.05 mg/dL (H)).    No Known Allergies  Narda Bonds, PharmD, BCPS Clinical Pharmacist Phone: [phone]

## 2021-09-01 NOTE — ED Notes (Signed)
Placed pt on 2L  for comfort. Pt O2 sat at 90%

## 2021-09-02 LAB — COMPREHENSIVE METABOLIC PANEL
ALT: 58 U/L — ABNORMAL HIGH (ref 0–44)
AST: 42 U/L — ABNORMAL HIGH (ref 15–41)
Albumin: 2.7 g/dL — ABNORMAL LOW (ref 3.5–5.0)
Alkaline Phosphatase: 56 U/L (ref 38–126)
Anion gap: 5 (ref 5–15)
BUN: 66 mg/dL — ABNORMAL HIGH (ref 8–23)
CO2: 21 mmol/L — ABNORMAL LOW (ref 22–32)
Calcium: 8.7 mg/dL — ABNORMAL LOW (ref 8.9–10.3)
Chloride: 115 mmol/L — ABNORMAL HIGH (ref 98–111)
Creatinine, Ser: 3.07 mg/dL — ABNORMAL HIGH (ref 0.61–1.24)
GFR, Estimated: 20 mL/min — ABNORMAL LOW (ref >60)
Glucose, Bld: 81 mg/dL (ref 70–99)
Potassium: 4.2 mmol/L (ref 3.5–5.1)
Sodium: 141 mmol/L (ref 135–145)
Total Bilirubin: 1 mg/dL (ref 0.3–1.2)
Total Protein: 5.6 g/dL — ABNORMAL LOW (ref 6.5–8.1)

## 2021-09-02 LAB — CBC
HCT: 27 % — ABNORMAL LOW (ref 39.0–52.0)
Hemoglobin: 8.8 g/dL — ABNORMAL LOW (ref 13.0–17.0)
MCH: 26.9 pg (ref 26.0–34.0)
MCHC: 32.6 g/dL (ref 30.0–36.0)
MCV: 82.6 fL (ref 80.0–100.0)
Platelets: 81 10*3/uL — ABNORMAL LOW (ref 150–400)
RBC: 3.27 MIL/uL — ABNORMAL LOW (ref 4.22–5.81)
RDW: 14.8 % (ref 11.5–15.5)
WBC: 11.2 10*3/uL — ABNORMAL HIGH (ref 4.0–10.5)
nRBC: 0 % (ref 0.0–0.2)

## 2021-09-02 NOTE — Evaluation (Signed)
Physical Therapy Evaluation Patient Details Name: Isaac Hunter MRN: [id number] DOB: 12-23-1940 Today's Date: 09/02/2021  History of Present Illness  81 y.o. male with medical history significant of CKD stage IV, bladder cancer s/p resection (2012), chronic thrombocytopenia, CAD, HLD, HTN and chronic  anemia of chronic disease admitted on 09/01/2021 with sepsis secondary to presumed urinary source as well as acute hypoxic respiratory failure in the setting of ongoing tobacco abuse.    Clinical Impression  Patient supine in bed upon therapist arrival and agreeable to participating in PT evaluation today. Malewick not intact - nursing notified. Patient reported he had a fall in the yard in February but was able to get back up by himself. Patient modified independent for bed mobility but require cuing with transfers using RW to decreased risk for falls. Patient demonstrated difficulty completing power up phase of sit to stand transfer with cuing but was able to use Ue's to control descent of stand to sit with cues. Patient ambulates with RW demonstrating fair endurance for activity but requires cues to walk within the base of support and stand more erect to reduce risk for falls. Patient returned to bed at end of session. Nursing notified.  Patient would continue to benefit from skilled physical therapy in current environment and next venue to continue return to prior function and increase strength, endurance, balance, coordination, and functional mobility and gait skills.       Recommendations for follow up therapy are one component of a multi-disciplinary discharge planning process, led by the attending physician.  Recommendations may be updated based on patient status, additional functional criteria and insurance authorization.  Follow Up Recommendations Home health PT      Assistance Recommended at Discharge Set up Supervision/Assistance  Patient can return home with the following  A little  help with walking and/or transfers;A little help with bathing/dressing/bathroom;Assist for transportation;Help with stairs or ramp for entrance    Equipment Recommendations None recommended by PT  Recommendations for Other Services       Functional Status Assessment Patient has had a recent decline in their functional status and demonstrates the ability to make significant improvements in function in a reasonable and predictable amount of time.     Precautions / Restrictions Precautions Precautions: Fall Precaution Comments: patient reports 1 fall in last six months in the yard Restrictions Weight Bearing Restrictions: No      Mobility  Bed Mobility Overal bed mobility: Modified Independent   General bed mobility comments: increased time    Transfers Overall transfer level: Needs assistance Equipment used: Rolling walker (2 wheels) Transfers: Sit to/from Stand, Bed to chair/wheelchair/BSC Sit to Stand: Supervision, Min guard   Step pivot transfers: Min guard       General transfer comment: cues for step sequencing and hand placement with RW; 3 attempts to complete power up with min guard and cuing    Ambulation/Gait Ambulation/Gait assistance: Min guard, Supervision Gait Distance (Feet): 100 Feet Assistive device: Rolling walker (2 wheels) Gait Pattern/deviations: Step-through pattern, Decreased step length - right, Decreased step length - left, Knee flexed in stance - right, Knee flexed in stance - left, Trunk flexed, Decreased stride length Gait velocity: decreased     General Gait Details: slow, labored cadence using RW with cues to walk within base of support and stand more erect; limited by fatigue; on room air throughout  Stairs      Wheelchair Mobility    Modified Rankin (Stroke Patients Only)  Balance Overall balance assessment: History of Falls, Needs assistance Sitting-balance support: Bilateral upper extremity supported, Feet  supported Sitting balance-Leahy Scale: Good Sitting balance - Comments: seated at EOB.   Standing balance support: Bilateral upper extremity supported, During functional activity, Reliant on assistive device for balance Standing balance-Leahy Scale: Fair Standing balance comment: fair with RW         Pertinent Vitals/Pain Pain Assessment Pain Assessment: No/denies pain    Home Living Family/patient expects to be discharged to:: Private residence Living Arrangements: Children Available Help at Discharge: Family;Available PRN/intermittently Type of Home: House Home Access: Stairs to enter Entrance Stairs-Rails: Can reach both Entrance Stairs-Number of Steps: 4   Home Layout: One level Home Equipment: Conservation officer, nature (2 wheels);Grab bars - tub/shower;Hand held shower head;Cane - single point      Prior Function Prior Level of Function : Needs assist;History of Falls (last six months)     Mobility Comments: reports mostly walking with RW and household distances ADLs Comments: reports independent with all ADLs     Hand Dominance        Extremity/Trunk Assessment   Upper Extremity Assessment Upper Extremity Assessment: Generalized weakness    Lower Extremity Assessment Lower Extremity Assessment: Generalized weakness    Cervical / Trunk Assessment Cervical / Trunk Assessment: Normal  Communication   Communication: No difficulties  Cognition Arousal/Alertness: Awake/alert Behavior During Therapy: WFL for tasks assessed/performed Overall Cognitive Status: No family/caregiver present to determine baseline cognitive functioning          General Comments      Exercises     Assessment/Plan    PT Assessment Patient needs continued PT services  PT Problem List Decreased strength;Decreased mobility;Decreased activity tolerance;Decreased balance;Decreased knowledge of use of DME       PT Treatment Interventions DME instruction;Therapeutic exercise;Gait  training;Balance training;Neuromuscular re-education;Functional mobility training;Patient/family education;Therapeutic activities;Cognitive remediation    PT Goals (Current goals can be found in the Care Plan section)  Acute Rehab PT Goals Patient Stated Goal: Go home with HHPT to get stronger. PT Goal Formulation: With patient Time For Goal Achievement: 09/16/21 Potential to Achieve Goals: Good    Frequency Min 3X/week        AM-PAC PT "6 Clicks" Mobility  Outcome Measure Help needed turning from your back to your side while in a flat bed without using bedrails?: None Help needed moving from lying on your back to sitting on the side of a flat bed without using bedrails?: None Help needed moving to and from a bed to a chair (including a wheelchair)?: A Little Help needed standing up from a chair using your arms (e.g., wheelchair or bedside chair)?: A Little Help needed to walk in hospital room?: A Little Help needed climbing 3-5 steps with a railing? : A Lot 6 Click Score: 19    End of Session Equipment Utilized During Treatment: Gait belt Activity Tolerance: Patient tolerated treatment well;Patient limited by fatigue Patient left: in bed;with call bell/phone within reach;with bed alarm set Nurse Communication: Mobility status PT Visit Diagnosis: Unsteadiness on feet (R26.81);Other abnormalities of gait and mobility (R26.89);Muscle weakness (generalized) (M62.81)    Time: 3149-7026 PT Time Calculation (min) (ACUTE ONLY): 31 min   Charges:   PT Evaluation $PT Eval Low Complexity: 1 Low PT Treatments $Therapeutic Activity: 8-22 mins        Floria Raveling. Hartnett-Rands, MS, PT Per [place] [phone]  Pamala Hurry  Hartnett-Rands 09/02/2021, 11:19 AM

## 2021-09-02 NOTE — Progress Notes (Signed)
0842 and [phone] patient eating. RT will administer tx at a later time.

## 2021-09-02 NOTE — Plan of Care (Signed)
  Problem: Acute Rehab PT Goals(only PT should resolve) Goal: Patient Will Transfer Sit To/From Stand Outcome: Progressing Flowsheets (Taken 09/02/2021 1124) Patient will transfer sit to/from stand:  with supervision  with cues (comment type and amount) Note: Step sequencing, hand placement Goal: Pt Will Transfer Bed To Chair/Chair To Bed 09/02/2021 1124 by Hartnett-Rands, Pamala Hurry, [place] (Taken 09/02/2021 1124) Pt will Transfer Bed to Chair/Chair to Bed: with supervision 09/02/2021 1124 by Hartnett-Rands, Pamala Hurry, PT Outcome: Progressing Goal: Pt Will Perform Standing Balance Or Pre-Gait 09/02/2021 1124 by Hartnett-Rands, Pamala Hurry, [place] (Taken 09/02/2021 1124) Pt will perform standing balance or pre-gait:  3- 5 min  with min guard assist  with unilateral UE support 09/02/2021 1124 by Hartnett-Rands, Pamala Hurry, PT Outcome: Progressing Goal: Pt Will Ambulate 09/02/2021 1124 by Hartnett-Rands, Pamala Hurry, PT Flowsheets (Taken 09/02/2021 1124) Pt will Ambulate:  100 feet  with least restrictive assistive device  with min guard assist 09/02/2021 1124 by Hartnett-Rands, Pamala Hurry, PT Outcome: Progressing Goal: Pt Will Go Up/Down Stairs 09/02/2021 1124 by Hartnett-Rands, Pamala Hurry, PT Flowsheets (Taken 09/02/2021 1124) Pt will Go Up / Down Stairs:  3-5 stairs  with rail(s) 09/02/2021 1124 by Jeannie Done, PT Outcome: [place] D. Hartnett-Rands, MS, PT Per [place] [phone] 09/01/2021

## 2021-09-02 NOTE — Progress Notes (Signed)
PROGRESS NOTE     Isaac Hunter, is a 81 y.o. male, DOB - [birth year], JQZ:[id number]  Admit date - 08/31/2021   Admitting Physician Bernadette Hoit, DO  Outpatient Primary MD for the patient is [facility] [place]  LOS - 1  Chief Complaint  Patient presents with   Altered Mental Status   Brief Summary:-  81 y.o. male with medical history significant of CKD stage IV, bladder cancer s/p resection (2012), chronic thrombocytopenia, CAD, HLD, HTN and chronic  anemia of chronic disease admitted on 09/01/2021 with sepsis secondary to presumed urinary source as well as acute hypoxic respiratory failure in the setting of ongoing tobacco abuse   A/p 1) Pseudomonas sepsis secondary to presumed urinary source--POA -WBC 19.0 >>11.2  -Lactic acid is down to 1.3 from 4.4 after IV fluids -Continue cefepime pending sensitivity -Discontinue vancomycin  2)HTN--continue lisinopril 40 mg daily and amlodipine 10 mg daily, IV hydralazine as needed elevated BP   3) acute hypoxic respiratory failure/COPD/Tobacco abuse--- bronchodilators and mucolytics as ordered -Patient smokes >> 2 packs/day -Successfully weaned off oxygen at this time   4)AKI----acute kidney injury on CKD stage - IV   -- creatinine on admission= 4.05  ,  -baseline creatinine = 2.2 to 2.7 (in care everywhere)   ,  -Creatinine currently down to 3 -renally adjust medications, avoid nephrotoxic agents / dehydration  / hypotension   5)Elevated LFTs--- query related to sepsis/infection -LFTs are trending down   6)Chronic anemia/chronic thrombocytopenia----currently close to baseline   7)HLD--- continue Lipitor  Disposition/Need for in-Hospital Stay- patient unable to be discharged at this time due to Pseudomonas sepsis secondary to urinary source clinically improving on IV cefepime unable to discharge without sensitivities   Status is: Inpatient   Disposition: The patient is from: Home              Anticipated d/c is  to: Home              Anticipated d/c date is: 1 day              Patient currently is not medically stable to d/c. Barriers: Not Clinically Stable-   Code Status :  -  Code Status: Full Code   Family Communication:   NA (patient is alert, awake and coherent)   DVT Prophylaxis  :   - SCDs  SCDs Start: 09/01/21 6226   Lab Results  Component Value Date   PLT 81 (L) 09/02/2021    Inpatient Medications  Scheduled Meds:  amLODipine  10 mg Oral Daily   atorvastatin  80 mg Oral Daily   dextromethorphan-guaiFENesin  1 tablet Oral BID   ipratropium-albuterol  3 mL Nebulization TID   lisinopril  40 mg Oral Daily   nicotine  21 mg Transdermal Daily   Continuous Infusions:  ceFEPime (MAXIPIME) IV 2 g (09/01/21 2149)   PRN Meds:.albuterol, hydrALAZINE, melatonin   Anti-infectives (From admission, onward)    Start     Dose/Rate Route Frequency Ordered Stop   09/02/21 2200  vancomycin (VANCOREADY) IVPB 750 mg/150 mL  Status:  Discontinued        750 mg 150 mL/hr over 60 Minutes Intravenous Every 48 hours 09/01/21 0209 09/02/21 0948   09/01/21 2200  ceFEPIme (MAXIPIME) 2 g in sodium chloride 0.9 % 100 mL IVPB        2 g 200 mL/hr over 30 Minutes Intravenous Every 24 hours 09/01/21 0209     08/31/21 2200  ceFEPIme (MAXIPIME) 2 g  in sodium chloride 0.9 % 100 mL IVPB        2 g 200 mL/hr over 30 Minutes Intravenous  Once 08/31/21 2156 08/31/21 2315   08/31/21 2200  metroNIDAZOLE (FLAGYL) IVPB 500 mg        500 mg 100 mL/hr over 60 Minutes Intravenous  Once 08/31/21 2156 09/01/21 0030   08/31/21 2200  vancomycin (VANCOCIN) IVPB 1000 mg/200 mL premix        1,000 mg 200 mL/hr over 60 Minutes Intravenous  Once 08/31/21 2156 09/01/21 0141       Subjective: Isaac Hunter today has no fevers, no emesis,  No chest pain,   Ambulated with physical therapist -Impulsive at times   Objective: Vitals:   09/02/21 0938 09/02/21 1110 09/02/21 1314 09/02/21 1504  BP: (!) 157/81  (!)  155/61   Pulse:   90   Resp:   16   Temp:   98 F (36.7 C)   TempSrc:      SpO2:  100% 98% (!) 9%  Weight:      Height:        Intake/Output Summary (Last 24 hours) at 09/02/2021 1904 Last data filed at 09/02/2021 1700 Gross per 24 hour  Intake 120 ml  Output 2850 ml  Net -2730 ml   Filed Weights   08/31/21 1824  Weight: 65.8 kg    Physical Exam  Gen:- Awake Alert,  in no apparent distress  HEENT:- [place].AT, No sclera icterus Neck-Supple Neck,No JVD,.  Lungs-  CTAB , fair symmetrical air movement CV- S1, S2 normal, regular  Abd-  +ve B.Sounds, Abd Soft, No tenderness, no CVA area tenderness Extremity/Skin:- No  edema, pedal pulses present  Psych-affect is appropriate, oriented x3 Neuro-generalized weakness, no new focal deficits, no tremors  Data Reviewed: I have personally reviewed following labs and imaging studies  CBC: Recent Labs  Lab 08/31/21 2015 09/02/21 0546  WBC 19.0* 11.2*  NEUTROABS 15.8*  --   HGB 9.7* 8.8*  HCT 30.0* 27.0*  MCV 85.0 82.6  PLT 110* 81*   Basic Metabolic Panel: Recent Labs  Lab 08/31/21 2015 09/01/21 0631 09/02/21 0546  NA 142  --  141  K 5.3*  --  4.2  CL 113*  --  115*  CO2 20*  --  21*  GLUCOSE 179*  --  81  BUN 74*  --  66*  CREATININE 4.05*  --  3.07*  CALCIUM 9.3  --  8.7*  MG  --  2.0  --   PHOS  --  3.8  --    GFR: Estimated Creatinine Clearance: 17.3 mL/min (A) (by C-G formula based on SCr of 3.07 mg/dL (H)). Liver Function Tests: Recent Labs  Lab 08/31/21 2015 09/02/21 0546  AST 67* 42*  ALT 95* 58*  ALKPHOS 70 56  BILITOT 0.8 1.0  PROT 7.3 5.6*  ALBUMIN 3.6 2.7*   Cardiac Enzymes: No results for input(s): "CKTOTAL", "CKMB", "CKMBINDEX", "TROPONINI" in the last 168 hours. BNP (last 3 results) No results for input(s): "PROBNP" in the last 8760 hours. HbA1C: Recent Labs    09/01/21 0629  HGBA1C 5.4   Sepsis Labs: '@LABRCNTIP'$ (procalcitonin:4,lacticidven:4) ) Recent Results (from the past 240  hour(s))  Culture, blood (Routine x 2)     Status: None (Preliminary result)   Collection Time: 08/31/21  7:15 PM   Specimen: BLOOD  Result Value Ref Range Status   Specimen Description BLOOD  Final   Special Requests NONE  Final  Culture   Final    NO GROWTH 2 DAYS Performed at [facility], [address], [place], [place] [address]    Report Status PENDING  Incomplete  Culture, blood (Routine x 2)     Status: None (Preliminary result)   Collection Time: 08/31/21  9:29 PM   Specimen: BLOOD RIGHT ARM  Result Value Ref Range Status   Specimen Description BLOOD RIGHT ARM  Final   Special Requests   Final    BOTTLES DRAWN AEROBIC ONLY Blood Culture results may not be optimal due to an inadequate volume of blood received in culture bottles   Culture   Final    NO GROWTH 2 DAYS Performed at [facility], [address], [place], [place] [address]    Report Status PENDING  Incomplete  Urine Culture     Status: Abnormal (Preliminary result)   Collection Time: 08/31/21  9:32 PM   Specimen: In/Out Cath Urine  Result Value Ref Range Status   Specimen Description   Final    IN/OUT CATH URINE Performed at [facility], [address] [place], [place] [address]    Special Requests   Final    NONE Performed at [facility], [address] [place], [place] [address]    Culture (A)  Final    >=100,000 COLONIES/mL PSEUDOMONAS AERUGINOSA CULTURE REINCUBATED FOR BETTER GROWTH Performed at [facility], [place] [address] [place], [place] [address]    Report Status PENDING  Incomplete  Resp Panel by RT-PCR (Flu A&B, Covid) Anterior Nasal Swab     Status: None   Collection Time: 09/01/21 12:14 AM   Specimen: Anterior Nasal Swab  Result Value Ref Range Status   SARS Coronavirus 2 by RT PCR NEGATIVE NEGATIVE Final    Comment: (NOTE) SARS-CoV-2 target nucleic acids are NOT DETECTED.  The SARS-CoV-2 RNA is generally detectable in upper respiratory specimens during the acute phase of  infection. The lowest concentration of SARS-CoV-2 viral copies this assay can detect is 138 copies/mL. A negative result does not preclude SARS-Cov-2 infection and should not be used as the sole basis for treatment or other patient management decisions. A negative result may occur with  improper specimen collection/handling, submission of specimen other than nasopharyngeal swab, presence of viral mutation(s) within the areas targeted by this assay, and inadequate number of viral copies(<138 copies/mL). A negative result must be combined with clinical observations, patient history, and epidemiological information. The expected result is Negative.  Fact Sheet for Patients:  EntrepreneurPulse.com.au  Fact Sheet for Healthcare Providers:  IncredibleEmployment.be  This test is no t yet approved or cleared by the [place] FDA and  has been authorized for detection and/or diagnosis of SARS-CoV-2 by FDA under an Emergency Use Authorization (EUA). This EUA will remain  in effect (meaning this test can be used) for the duration of the COVID-19 declaration under Section 564(b)(1) of the Act, 21 U.S.C.section 360bbb-3(b)(1), unless the authorization is terminated  or revoked sooner.       Influenza A by PCR NEGATIVE NEGATIVE Final   Influenza B by PCR NEGATIVE NEGATIVE Final    Comment: (NOTE) The Xpert Xpress SARS-CoV-2/FLU/RSV plus assay is intended as an aid in the diagnosis of influenza from Nasopharyngeal swab specimens and should not be used as a sole basis for treatment. Nasal washings and aspirates are unacceptable for Xpert Xpress SARS-CoV-2/FLU/RSV testing.  Fact Sheet for Patients: EntrepreneurPulse.com.au  Fact Sheet for Healthcare Providers: IncredibleEmployment.be  This test is not yet approved  or cleared by the [place] and has been authorized for detection and/or diagnosis of SARS-CoV-2  by FDA under an Emergency Use Authorization (EUA). This EUA will remain in effect (meaning this test can be used) for the duration of the COVID-19 declaration under Section 564(b)(1) of the Act, 21 U.S.C. section 360bbb-3(b)(1), unless the authorization is terminated or revoked.  Performed at [facility], [address] [place], [place] [address]      Radiology Studies: CT Abdomen Pelvis Wo Contrast  Result Date: 08/31/2021 CLINICAL DATA:  Sepsis altered EXAM: CT ABDOMEN AND PELVIS WITHOUT CONTRAST TECHNIQUE: Multidetector CT imaging of the abdomen and pelvis was performed following the standard protocol without IV contrast. RADIATION DOSE REDUCTION: This exam was performed according to the departmental dose-optimization program which includes automated exposure control, adjustment of the mA and/or kV according to patient size and/or use of iterative reconstruction technique. COMPARISON:  None Available. FINDINGS: Lower chest: Motion degradation. Mild lower lobe bronchial wall thickening. No acute airspace disease or pleural effusion. Cardiomegaly. Coronary vascular calcification. Hepatobiliary: Liver granuloma. No calcified gallstone or biliary dilatation Pancreas: Unremarkable. No pancreatic ductal dilatation or surrounding inflammatory changes. Spleen: Normal in size without focal abnormality. Adrenals/Urinary Tract: Thickened enlarged adrenal glands left greater than right without discrete measurable mass. Kidneys show no hydronephrosis. Intrarenal vascular calcifications. Possible punctate bilateral kidney stones. Thick-walled appearance of the bladder with perivesical stranding and indistinct wall. Stomach/Bowel: The stomach is nonenlarged. No dilated small bowel. No acute bowel wall thickening. Negative appendix. Vascular/Lymphatic: Advanced aortic atherosclerosis. Aneurysmal dilatation of the distal infrarenal abdominal aorta measuring up to 4.6 cm. No suspicious lymph nodes Reproductive:  Enlarged prostate Other: Negative for pelvic effusion or free air. Musculoskeletal: No acute osseous abnormality IMPRESSION: 1. Indistinct wall thickening of the urinary bladder with mild perivesical stranding, suggestive of cystitis. There is no evidence for hydronephrosis. There are possible punctate intrarenal stones bilaterally. 2. 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 Recommend follow-up CT/MR every 6 months and vascular consultation. This recommendation follows ACR consensus guidelines: White Paper of the ACR Incidental Findings Committee II on Vascular Findings. J Am Coll Radiol 2013; 10:789-794. 3. Enlarged prostate Electronically Signed   By: Donavan Foil M.D.   On: 08/31/2021 23:11   CT Head Wo Contrast  Result Date: 08/31/2021 CLINICAL DATA:  Mental status change, unknown cause EXAM: CT HEAD WITHOUT CONTRAST TECHNIQUE: Contiguous axial images were obtained from the base of the skull through the vertex without intravenous contrast. RADIATION DOSE REDUCTION: This exam was performed according to the departmental dose-optimization program which includes automated exposure control, adjustment of the mA and/or kV according to patient size and/or use of iterative reconstruction technique. COMPARISON:  CT head 08/11/2021, CT head 12/07/2019 BRAIN: BRAIN Cerebral ventricle sizes are concordant with the degree of cerebral volume loss. Patchy and confluent areas of decreased attenuation are noted throughout the deep and periventricular white matter of the cerebral hemispheres bilaterally, compatible with chronic microvascular ischemic disease. Right occipital lobe chronic infarction. No evidence of large-territorial acute infarction. No parenchymal hemorrhage. No mass lesion. No extra-axial collection. No mass effect or midline shift. No hydrocephalus. Basilar cisterns are patent. Vascular: No hyperdense vessel. Skull: No acute fracture or focal lesion. Sinuses/Orbits: Paranasal sinuses  and mastoid air cells are clear. The orbits are unremarkable. Other: None. IMPRESSION: No acute intracranial abnormality. Electronically Signed   By: Iven Finn M.D.   On: 08/31/2021 23:10   DG Chest Port 1 View  Result Date: 08/31/2021 CLINICAL  DATA:  Altered mental status.  History of bladder cancer. EXAM: PORTABLE CHEST 1 VIEW COMPARISON:  11/18/2010 FINDINGS: Heart size and pulmonary vascularity are normal. 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 No airspace disease or consolidation. No pleural effusions. No pneumothorax. Mediastinal contours appear intact. Calcification of the aorta. Degenerative changes in the spine. 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 No focal consolidation. Electronically Signed   By: Lucienne Capers M.D.   On: 08/31/2021 19:37    Scheduled Meds:  amLODipine  10 mg Oral Daily   atorvastatin  80 mg Oral Daily   dextromethorphan-guaiFENesin  1 tablet Oral BID   ipratropium-albuterol  3 mL Nebulization TID   lisinopril  40 mg Oral Daily   nicotine  21 mg Transdermal Daily   Continuous Infusions:  ceFEPime (MAXIPIME) IV 2 g (09/01/21 2149)     LOS: 1 day   Roxan Hockey M.D on 09/02/2021 at 7:04 PM  Go to [email or web address] - for contact info  [facility] - Office  [phone]  If 7PM-7AM, please contact night-coverage [email or web address] 09/02/2021, 7:04 PM

## 2021-09-02 NOTE — TOC Initial Note (Signed)
Transition of Care [facility] - Initial/Assessment Note    Patient Details  Name: Isaac Hunter MRN: [id number] Date of Birth: 1940/03/09  Transition of Care [facility]) CM/SW Contact:    Boneta Lucks, RN Phone Number: 09/02/2021, 3:24 PM  Clinical Narrative:     Patient admitted for severe sepsis. He lives alone, states his son will stay will him at times. He has a cane and walker. PT is recommending HHPT, patient is agreeable. Vaughan Basta with [facility] accepted the referral. MD is aware to place orders.              Expected Discharge Plan: [place] Barriers to Discharge: Continued Medical Work up   Patient Goals and CMS Choice Patient states their goals for this hospitalization and ongoing recovery are:: to go home. CMS Medicare.gov Compare Post Acute Care list provided to:: Patient Choice offered to / list presented to : Patient  Expected Discharge Plan and Services Expected Discharge Plan: [place] Choice: [place] arrangements for the past 2 months: [place]: PT [place] Agency: [place] ([place]) Date [place]: 09/02/21 Time [place]: 9924 Representative spoke with at [place]: [place] Arrangements/Services Living arrangements for the past 2 months: [place] Lives with:: Self   Do you feel safe going back to the place where you live?: Yes          Current home services: DME    Activities of Daily Living Home Assistive Devices/Equipment: None ADL Screening (condition at time of admission) Patient's cognitive ability adequate to safely complete daily activities?: No Is the patient deaf or have difficulty hearing?: No Does the patient have difficulty seeing, even when wearing glasses/contacts?: No Does the patient have difficulty concentrating, remembering, or making decisions?: Yes Patient able to express need  for assistance with ADLs?: No Does the patient have difficulty dressing or bathing?: No Independently performs ADLs?: No Communication: Dependent Is this a change from baseline?: Pre-admission baseline Dressing (OT): Dependent Is this a change from baseline?: Pre-admission baseline Grooming: Dependent Is this a change from baseline?: Pre-admission baseline Feeding: Dependent Is this a change from baseline?: Pre-admission baseline Bathing: Dependent Is this a change from baseline?: Pre-admission baseline Toileting: Dependent Is this a change from baseline?: Pre-admission baseline In/Out Bed: Dependent Is this a change from baseline?: Pre-admission baseline Walks in Home: Needs assistance Is this a change from baseline?: Pre-admission baseline Does the patient have difficulty walking or climbing stairs?: Yes Weakness of Legs: Both Weakness of Arms/Hands: Both Emotional Assessment     Affect (typically observed): Accepting Orientation: : Oriented to Self, Oriented to Place, Oriented to  Time, Oriented to Situation Alcohol / Substance Use: Not Applicable Psych Involvement: No (comment)  Admission diagnosis:  AKI (acute kidney injury) ([place]) [N17.9] Sepsis secondary to UTI ([place]) [A41.9, N39.0] Sepsis, due to unspecified organism, unspecified whether acute organ dysfunction present [facility]) [A41.9] Patient Active Problem List   Diagnosis Date Noted   Severe sepsis ([place]) 09/01/2021   Hyperkalemia 09/01/2021   Hyperglycemia 09/01/2021   Transaminasemia 09/01/2021   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 [place] [address] Phone: [phone] Fax: [phone]   Readmission Risk Interventions    09/02/2021    3:23 PM  Readmission Risk Prevention Plan  Transportation Screening Complete  PCP or Specialist Appt within 5-7 Days Not Complete  Home Care Screening Complete  Medication Review (RN CM) Complete

## 2021-09-03 LAB — COMPREHENSIVE METABOLIC PANEL
ALT: 63 U/L — ABNORMAL HIGH (ref 0–44)
AST: 61 U/L — ABNORMAL HIGH (ref 15–41)
Albumin: 2.9 g/dL — ABNORMAL LOW (ref 3.5–5.0)
Alkaline Phosphatase: 63 U/L (ref 38–126)
Anion gap: 5 (ref 5–15)
BUN: 52 mg/dL — ABNORMAL HIGH (ref 8–23)
CO2: 22 mmol/L (ref 22–32)
Calcium: 8.7 mg/dL — ABNORMAL LOW (ref 8.9–10.3)
Chloride: 113 mmol/L — ABNORMAL HIGH (ref 98–111)
Creatinine, Ser: 2.67 mg/dL — ABNORMAL HIGH (ref 0.61–1.24)
GFR, Estimated: 23 mL/min — ABNORMAL LOW (ref >60)
Glucose, Bld: 118 mg/dL — ABNORMAL HIGH (ref 70–99)
Potassium: 3.9 mmol/L (ref 3.5–5.1)
Sodium: 140 mmol/L (ref 135–145)
Total Bilirubin: 1.2 mg/dL (ref 0.3–1.2)
Total Protein: 6.1 g/dL — ABNORMAL LOW (ref 6.5–8.1)

## 2021-09-03 LAB — CBC
HCT: 30.6 % — ABNORMAL LOW (ref 39.0–52.0)
Hemoglobin: 10.2 g/dL — ABNORMAL LOW (ref 13.0–17.0)
MCH: 27.3 pg (ref 26.0–34.0)
MCHC: 33.3 g/dL (ref 30.0–36.0)
MCV: 82 fL (ref 80.0–100.0)
Platelets: 97 10*3/uL — ABNORMAL LOW (ref 150–400)
RBC: 3.73 MIL/uL — ABNORMAL LOW (ref 4.22–5.81)
RDW: 14.8 % (ref 11.5–15.5)
WBC: 7.5 10*3/uL (ref 4.0–10.5)
nRBC: 0 % (ref 0.0–0.2)

## 2021-09-03 MED ORDER — SODIUM CHLORIDE 0.9 % IV SOLN: 2.0000 g | Freq: Two times a day (BID) | INTRAVENOUS | Status: DC

## 2021-09-03 MED ORDER — SODIUM CHLORIDE 0.9 % IV SOLN
2.0000 g | Freq: Once | INTRAVENOUS | Status: AC
  Administered 2021-09-03: 2 g via INTRAVENOUS
  Filled 2021-09-03: qty 12.5

## 2021-09-03 MED ORDER — LOSARTAN POTASSIUM-HCTZ 50-12.5 MG PO TABS: 1.0000 | ORAL_TABLET | Freq: Every day | ORAL | 3 refills | Status: DC

## 2021-09-03 MED ORDER — ASPIRIN 81 MG PO TBEC: 81.0000 mg | DELAYED_RELEASE_TABLET | Freq: Every day | ORAL | 12 refills | Status: DC

## 2021-09-03 MED ORDER — AMOXICILLIN 500 MG PO CAPS: 500.0000 mg | ORAL_CAPSULE | Freq: Two times a day (BID) | ORAL | 0 refills | Status: AC

## 2021-09-03 MED ORDER — FOSFOMYCIN TROMETHAMINE 3 G PO PACK
3.0000 g | PACK | Freq: Once | ORAL | Status: AC
  Administered 2021-09-03: 3 g via ORAL
  Filled 2021-09-03: qty 3

## 2021-09-03 MED ORDER — CARVEDILOL 6.25 MG PO TABS: 6.2500 mg | ORAL_TABLET | Freq: Two times a day (BID) | ORAL | 3 refills | Status: DC

## 2021-09-03 MED ORDER — AMLODIPINE BESYLATE 10 MG PO TABS: 10.0000 mg | ORAL_TABLET | Freq: Every day | ORAL | 3 refills | Status: DC

## 2021-09-03 MED ORDER — HYDRALAZINE HCL 20 MG/ML IJ SOLN
10.0000 mg | Freq: Once | INTRAMUSCULAR | Status: AC
  Administered 2021-09-03: 10 mg via INTRAVENOUS
  Filled 2021-09-03: qty 1

## 2021-09-03 MED ORDER — NICOTINE 21 MG/24HR TD PT24: 21.0000 mg | MEDICATED_PATCH | Freq: Every day | TRANSDERMAL | 0 refills | Status: DC

## 2021-09-03 MED ORDER — CIPROFLOXACIN HCL 500 MG PO TABS: 500.0000 mg | ORAL_TABLET | Freq: Every day | ORAL | 0 refills | Status: AC

## 2021-09-03 MED ORDER — ATORVASTATIN CALCIUM 80 MG PO TABS: 80.0000 mg | ORAL_TABLET | Freq: Every day | ORAL | 3 refills | Status: DC

## 2021-09-03 MED ORDER — IPRATROPIUM-ALBUTEROL 0.5-2.5 (3) MG/3ML IN SOLN: 3.0000 mL | Freq: Two times a day (BID) | RESPIRATORY_TRACT | Status: DC

## 2021-09-03 NOTE — Discharge Summary (Signed)
Isaac Hunter, is a 81 y.o. male  DOB 1940/05/10  MRN [id number].  Admission date:  08/31/2021  Admitting Physician  Bernadette Hoit, DO  Discharge Date:  09/03/2021   Primary MD  Inc, [facility]  Recommendations for primary care physician for things to follow:   1)Avoid ibuprofen/Advil/Aleve/Motrin/Goody Powders/Naproxen/BC powders/Meloxicam/Diclofenac/Indomethacin and other Nonsteroidal anti-inflammatory medications as these will make you more likely to bleed and can cause stomach ulcers, can also cause Kidney problems.   2)Repeat CBC and BMP Blood Test with primary care physician in about 4 to 5 days from now  3)Please note that there has been several changes to your medications  Admission Diagnosis  AKI (acute kidney injury) ([place]) [N17.9] Sepsis secondary to UTI ([place]) [A41.9, N39.0] Sepsis, due to unspecified organism, unspecified whether acute organ dysfunction present [facility]) [A41.9]   Discharge Diagnosis  AKI (acute kidney injury) ([place]) [N17.9] Sepsis secondary to UTI ([place]) [A41.9, N39.0] Sepsis, due to unspecified organism, unspecified whether acute organ dysfunction present [facility]) [A41.9]    Principal Problem:   Severe sepsis ([place]) Active Problems:   Thrombocytopenia ([place])   Essential hypertension   Hyperkalemia   Hyperglycemia   Transaminasemia   Acute kidney injury superimposed on chronic kidney disease ([place])   Mixed hyperlipidemia   Insomnia      Past Medical History:  Diagnosis Date   CAD (coronary artery disease)    Non obstructive per cath 2001.   Diabetes mellitus    hx per pt, no longer has?   Hypertension    hx per pt, no long has??   Tobacco abuse 11/17/2010    Past Surgical History:  Procedure Laterality Date   CYSTOSCOPY  11/21/2010   Procedure: CYSTOSCOPY;  Surgeon: Marissa Nestle;  Location: [facility];  Service: Urology;  Laterality: N/A;   Left  hand surgery     TRANSURETHRAL RESECTION OF BLADDER TUMOR  11/21/2010   Procedure: TRANSURETHRAL RESECTION OF BLADDER TUMOR (TURBT);  Surgeon: Marissa Nestle;  Location: [facility];  Service: Urology;  Laterality: N/A;  evacuation of clots      HPI  from the history and physical done on the day of admission:     HPI: Isaac Hunter is a 81 y.o. male with medical history significant of CKD stage IV, bladder cancer s/p resection (2012), thrombocytopenia, CAD, hypertension, hyperlipidemia, anemia of chronic disease who presents to the emergency via EMS due to altered mental status.  Patient was alert and oriented to person at bedside, but was unable to provide any history, history was obtained from ED physician and ED medical record.  Per report, EMS was activated due to noted change in patient's mental status (alert and oriented x3 at baseline), on arrival of EMS team, he was assisted to bathroom and patient was noted with milky colored urine.  He denies pain or any acute distress.   ED Course:  In the emergency department, he was intermittently tachypneic, BP was 144/66, but other vital signs were within normal range.  Work-up in  the ED leukocytosis, normocytic anemia, thrombocytopenia.  BMP showed sodium 142, potassium 5.3, chloride 113, bicarb 20, glucose 179, BUN 74, creatinine 4.05 (creatinine was 2.76 on 08/11/2021).  Lactic acid 2.9 > 3.6 > 4.4 > 2.5.  Urinalysis showed cloudy urine with proteinuria and large leukocytes and moderate hemoglobin in urine dipstick.  Blood culture was pending. CT abdomen and pelvis without contrast showed 1. Indistinct wall thickening of the urinary bladder with mild perivesical stranding, suggestive of cystitis. There is no evidence for hydronephrosis. There are possible punctate intrarenal stones bilaterally. 2. 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 Recommend follow-up CT/MR every 6 months and vascular consultation CT head without contrast  showed no acute intracranial abnormality Chest x-ray showed slight interstitial pattern to the lung bases, likely fibrosis. No focal consolidation. Patient was treated with IV cefepime, Flagyl and vancomycin.  IV hydration was provided.  Hospitalist was asked to admit patient for further evaluation and management   Review of Systems: Review of systems as noted in the HPI. All other systems reviewed and are negative.     Hospital Course:   Assessment and Plan:  Brief Summary:-  81 y.o. male with medical history significant of CKD stage IV, bladder cancer s/p resection (2012), chronic thrombocytopenia, CAD, HLD, HTN and chronic  anemia of chronic disease admitted on 09/01/2021 with sepsis secondary to presumed urinary source as well as acute hypoxic respiratory failure in the setting of ongoing tobacco abuse   A/p 1) sepsis secondary to PSEUDOMONAS AERUGINOSA/ENTEROCOCCUS FAECIUM  UTI --POA -WBC 19.0 >>11.2 >>7.5 -Lactic acid is down to 1.3 from 4.4 after IV fluids -Treated with IV Cefepime  -Discontinued vancomycin -Received oral fosfomycin for Enterococcus UTI -Discharged on Cipro adjusted for renal function to cover Pseudomonas and amoxicillin adjusted for renal function to cover Enterococcus -Clinically much improved   2)HTN--medications adjusted, discharged on amlodipine, Coreg and losartan/HCTZ   3) acute hypoxic respiratory failure/COPD/Tobacco abuse--- bronchodilators and mucolytics as ordered -Patient smokes >> 2 packs/day -Successfully weaned off oxygen at this time -Smoking cessation advised nicotine patch prescribed   4)AKI----acute kidney injury on CKD stage - IV   -- creatinine on admission= 4.05  ,  -baseline creatinine = 2.2 to 2.7 (in care everywhere)   ,  -Creatinine currently down to 2.67 which is close to recent baseline -Overall renal function improved with treatment of UTI and hydration -renally adjust medications, avoid nephrotoxic agents / dehydration  /  hypotension -Repeat CMP in 4 to 5 days advised   5)Elevated LFTs--- query related to sepsis/infection --AST is down to 61 from 67  -ALT is down to 63 from 95 T. bili is 1.2 -Hold Lipitor as advised below in #7 -Repeat CMP as outpatient in 4 to 5 days advised   6)Chronic anemia/chronic thrombocytopenia----currently close to baseline   7)HLD--- hold Lipitor due to elevated LFTs, repeat CMP in 4 to 5 days as outpatient  8) chronic anemia--- probably related to underlying CKD, worsened by hemodilution/IV fluids -No bleeding concerns -Hgb 10.2.Marland KitchenMarland Kitchen Stable   Disposition-Home in stable condition.... Home health RN to help with medication compliance improvement and cardiopulmonary assessment -Family looking into possible transfer to ALF   Disposition: The patient is from: Home              Anticipated d/c is to: Home  Discharge Condition: stable  Follow UP   Follow-up Information     [facility] Follow [facility].   Specialty: Home Health Services Why: PT will call  to schedule your first home visit.                Diet and Activity recommendation:  As advised  Discharge Instructions    Discharge Instructions     Call MD for:  difficulty breathing, headache or visual disturbances   Complete by: As directed    Call MD for:  persistant dizziness or light-headedness   Complete by: As directed    Call MD for:  persistant nausea and vomiting   Complete by: As directed    Call MD for:  temperature >100.4   Complete by: As directed    Diet - low sodium heart healthy   Complete by: As directed    Discharge instructions   Complete by: As directed    1)Avoid ibuprofen/Advil/Aleve/Motrin/Goody Powders/Naproxen/BC powders/Meloxicam/Diclofenac/Indomethacin and other Nonsteroidal anti-inflammatory medications as these will make you more likely to bleed and can cause stomach ulcers, can also cause Kidney problems.   2)Repeat CBC and BMP Blood Test with primary care  physician in about 4 to 5 days from now  3)Please note that there has been several changes to your medications   Face-to-face encounter (required for Medicare/Medicaid patients)   Complete by: As directed    I Arieonna Medine certify that this patient is under my care and that I, or a nurse practitioner or physician's assistant working with me, had a face-to-face encounter that meets the physician face-to-face encounter requirements with this patient on 09/03/2021. The encounter with the patient was in whole, or in part for the following medical condition(s) which is the primary reason for home health care (List medical condition):  - Home Health RN For medication compliance cardiopulmonary assessment   The encounter with the patient was in whole, or in part, for the following medical condition, which is the primary reason for home health care: Home Health RN For medication compliance cardiopulmonary assessment   I certify that, based on my findings, the following services are medically necessary home health services: Nursing   Reason for Medically Isaac Hunter: Skilled Nursing- Change/Decline in Patient Status   My clinical findings support the need for the above services: OTHER SEE COMMENTS   Further, I certify that my clinical findings support that this patient is homebound due to: Mental confusion   Home Health   Complete by: As directed    To provide the following care/treatments: RN   Home Health RN For medication compliance cardiopulmonary assessment   Increase activity slowly   Complete by: As directed        Discharge Medications     Allergies as of 09/03/2021   No Known Allergies      Medication List     STOP taking these medications    chlorthalidone 25 MG tablet Commonly known as: HYGROTON   lisinopril 40 MG tablet Commonly known as: ZESTRIL   NIFEdipine 30 MG 24 hr tablet Commonly known as: ADALAT CC   quinapril 40 MG tablet Commonly known as:  ACCUPRIL       TAKE these medications    acetaminophen 325 MG tablet Commonly known as: Tylenol Take 2 tablets (650 mg total) by mouth every 6 (six) hours as needed for up to 30 doses for mild pain.   amLODipine 10 MG tablet Commonly known as: NORVASC Take 1 tablet (10 mg total) by mouth daily. For BP What changed: additional instructions   amoxicillin 500 MG capsule Commonly known as: AMOXIL Take 1 capsule (500 mg total) by mouth  2 (two) times daily for 5 days. For UTI   aspirin EC 81 MG tablet Take 1 tablet (81 mg total) by mouth daily with breakfast. What changed: when to take this   atorvastatin 80 MG tablet Commonly known as: LIPITOR Take 1 tablet (80 mg total) by mouth daily. Start taking on: September 12, 2021 What changed: These instructions start on September 12, 2021. If you are unsure what to do until then, ask your doctor or other care provider.   carvedilol 6.25 MG tablet Commonly known as: Coreg Take 1 tablet (6.25 mg total) by mouth 2 (two) times daily with a meal. For BP   Cholecalciferol 25 MCG (1000 UT) tablet Take 2,000 Units by mouth daily.   ciprofloxacin 500 MG tablet Commonly known as: Cipro Take 1 tablet (500 mg total) by mouth daily for 5 days. For UTI   FeroSul 325 (65 FE) MG tablet Generic drug: ferrous sulfate Take 325 mg by mouth daily with breakfast.   losartan-hydrochlorothiazide 50-12.5 MG tablet Commonly known as: Hyzaar Take 1 tablet by mouth daily. For BP   Melatonin 3 MG Caps Take 3 mg by mouth daily as needed (sleep).   mirtazapine 7.5 MG tablet Commonly known as: REMERON Take 7.5 mg by mouth at bedtime.   nicotine 21 mg/24hr patch Commonly known as: NICODERM CQ - dosed in mg/24 hours Place 1 patch (21 mg total) onto the skin daily. Start taking on: September 04, 2021        Major procedures and Radiology Reports - PLEASE review detailed and final reports for all details, in brief -  CT Abdomen Pelvis Wo Contrast  Result  Date: 08/31/2021 CLINICAL DATA:  Sepsis altered EXAM: CT ABDOMEN AND PELVIS WITHOUT CONTRAST TECHNIQUE: Multidetector CT imaging of the abdomen and pelvis was performed following the standard protocol without IV contrast. RADIATION DOSE REDUCTION: This exam was performed according to the departmental dose-optimization program which includes automated exposure control, adjustment of the mA and/or kV according to patient size and/or use of iterative reconstruction technique. COMPARISON:  None Available. FINDINGS: Lower chest: Motion degradation. Mild lower lobe bronchial wall thickening. No acute airspace disease or pleural effusion. Cardiomegaly. Coronary vascular calcification. Hepatobiliary: Liver granuloma. No calcified gallstone or biliary dilatation Pancreas: Unremarkable. No pancreatic ductal dilatation or surrounding inflammatory changes. Spleen: Normal in size without focal abnormality. Adrenals/Urinary Tract: Thickened enlarged adrenal glands left greater than right without discrete measurable mass. Kidneys show no hydronephrosis. Intrarenal vascular calcifications. Possible punctate bilateral kidney stones. Thick-walled appearance of the bladder with perivesical stranding and indistinct wall. Stomach/Bowel: The stomach is nonenlarged. No dilated small bowel. No acute bowel wall thickening. Negative appendix. Vascular/Lymphatic: Advanced aortic atherosclerosis. Aneurysmal dilatation of the distal infrarenal abdominal aorta measuring up to 4.6 cm. No suspicious lymph nodes Reproductive: Enlarged prostate Other: Negative for pelvic effusion or free air. Musculoskeletal: No acute osseous abnormality IMPRESSION: 1. Indistinct wall thickening of the urinary bladder with mild perivesical stranding, suggestive of cystitis. There is no evidence for hydronephrosis. There are possible punctate intrarenal stones bilaterally. 2. 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 Recommend follow-up CT/MR every  6 months and vascular consultation. This recommendation follows ACR consensus guidelines: White Paper of the ACR Incidental Findings Committee II on Vascular Findings. J Am Coll Radiol 2013; 10:789-794. 3. Enlarged prostate Electronically Signed   By: Donavan Foil M.D.   On: 08/31/2021 23:11   CT Head Wo Contrast  Result Date: 08/31/2021 CLINICAL DATA:  Mental status change, unknown cause  EXAM: CT HEAD WITHOUT CONTRAST TECHNIQUE: Contiguous axial images were obtained from the base of the skull through the vertex without intravenous contrast. RADIATION DOSE REDUCTION: This exam was performed according to the departmental dose-optimization program which includes automated exposure control, adjustment of the mA and/or kV according to patient size and/or use of iterative reconstruction technique. COMPARISON:  CT head 08/11/2021, CT head 12/07/2019 BRAIN: BRAIN Cerebral ventricle sizes are concordant with the degree of cerebral volume loss. Patchy and confluent areas of decreased attenuation are noted throughout the deep and periventricular white matter of the cerebral hemispheres bilaterally, compatible with chronic microvascular ischemic disease. Right occipital lobe chronic infarction. No evidence of large-territorial acute infarction. No parenchymal hemorrhage. No mass lesion. No extra-axial collection. No mass effect or midline shift. No hydrocephalus. Basilar cisterns are patent. Vascular: No hyperdense vessel. Skull: No acute fracture or focal lesion. Sinuses/Orbits: Paranasal sinuses and mastoid air cells are clear. The orbits are unremarkable. Other: None. IMPRESSION: No acute intracranial abnormality. Electronically Signed   By: Iven Finn M.D.   On: 08/31/2021 23:10   DG Chest Port 1 View  Result Date: 08/31/2021 CLINICAL DATA:  Altered mental status.  History of bladder cancer. EXAM: PORTABLE CHEST 1 VIEW COMPARISON:  11/18/2010 FINDINGS: Heart size and pulmonary vascularity are normal. Mild  interstitial infiltrates in the lung bases, likely chronic fibrosis. No airspace disease or consolidation. No pleural effusions. No pneumothorax. Mediastinal contours appear intact. Calcification of the aorta. Degenerative changes in the spine. 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 No focal consolidation. Electronically Signed   By: Lucienne Capers M.D.   On: 08/31/2021 19:37   CT Head Wo Contrast  Result Date: 08/11/2021 CLINICAL DATA:  Syncope/presyncope, cerebrovascular cause suspected EXAM: CT HEAD WITHOUT CONTRAST TECHNIQUE: Contiguous axial images were obtained from the base of the skull through the vertex without intravenous contrast. RADIATION DOSE REDUCTION: This exam was performed according to the departmental dose-optimization program which includes automated exposure control, adjustment of the mA and/or kV according to patient size and/or use of iterative reconstruction technique. COMPARISON:  CT head 12/07/2019 BRAIN: BRAIN Cerebral ventricle sizes are concordant with the degree of cerebral volume loss. Patchy and confluent areas of decreased attenuation are noted throughout the deep and periventricular white matter of the cerebral hemispheres bilaterally, compatible with chronic microvascular ischemic disease. Chronic right parietooccipital encephalomalacia. No evidence of large-territorial acute infarction. No parenchymal hemorrhage. No mass lesion. No extra-axial collection. No mass effect or midline shift. No hydrocephalus. Basilar cisterns are patent. Vascular: No hyperdense vessel. Atherosclerotic calcifications are present within the cavernous internal carotid arteries. Skull: No acute fracture or focal lesion. Sinuses/Orbits: Paranasal sinuses and mastoid air cells are clear. The orbits are unremarkable. Other: None. IMPRESSION: No acute intracranial abnormality. Electronically Signed   By: Iven Finn M.D.   On: 08/11/2021 21:45    Micro Results    Recent Results (from the past 240 hour(s))  Culture, blood (Routine x 2)     Status: None (Preliminary result)   Collection Time: 08/31/21  7:15 PM   Specimen: BLOOD  Result Value Ref Range Status   Specimen Description BLOOD  Final   Special Requests NONE  Final   Culture   Final    NO GROWTH 3 DAYS Performed at [facility], [address], [place], [place] [address]    Report Status PENDING  Incomplete  Culture, blood (Routine x 2)     Status: None (Preliminary result)   Collection Time: 08/31/21  9:29 PM   Specimen: BLOOD RIGHT ARM  Result Value Ref Range Status   Specimen Description BLOOD RIGHT ARM  Final   Special Requests   Final    BOTTLES DRAWN AEROBIC ONLY Blood Culture results may not be optimal due to an inadequate volume of blood received in culture bottles   Culture   Final    NO GROWTH 3 DAYS Performed at [facility], [address], [place], [place] [address]    Report Status PENDING  Incomplete  Urine Culture     Status: Abnormal (Preliminary result)   Collection Time: 08/31/21  9:32 PM   Specimen: In/Out Cath Urine  Result Value Ref Range Status   Specimen Description   Final    IN/OUT CATH URINE Performed at [facility], [address] [place], [place] [address]    Special Requests   Final    NONE Performed at [facility], [address] [place], [place] [address]    Culture (A)  Final    >=100,000 COLONIES/mL PSEUDOMONAS AERUGINOSA 10,000 COLONIES/mL ENTEROCOCCUS FAECIUM SUSCEPTIBILITIES TO FOLLOW Performed at [facility], [place] [address], [place], [place] [address]    Report Status PENDING  Incomplete   Organism ID, Bacteria PSEUDOMONAS AERUGINOSA (A)  Final      Susceptibility   Pseudomonas aeruginosa - MIC*    CEFTAZIDIME 4 SENSITIVE Sensitive     CIPROFLOXACIN <=0.25 SENSITIVE Sensitive     GENTAMICIN 2 SENSITIVE Sensitive     IMIPENEM 1 SENSITIVE Sensitive     PIP/TAZO 8 SENSITIVE Sensitive     CEFEPIME 2 SENSITIVE Sensitive      * >=100,000 COLONIES/mL PSEUDOMONAS AERUGINOSA  Resp Panel by RT-PCR (Flu A&B, Covid) Anterior Nasal Swab     Status: None   Collection Time: 09/01/21 12:14 AM   Specimen: Anterior Nasal Swab  Result Value Ref Range Status   SARS Coronavirus 2 by RT PCR NEGATIVE NEGATIVE Final    Comment: (NOTE) SARS-CoV-2 target nucleic acids are NOT DETECTED.  The SARS-CoV-2 RNA is generally detectable in upper respiratory specimens during the acute phase of infection. The lowest concentration of SARS-CoV-2 viral copies this assay can detect is 138 copies/mL. A negative result does not preclude SARS-Cov-2 infection and should not be used as the sole basis for treatment or other patient management decisions. A negative result may occur with  improper specimen collection/handling, submission of specimen other than nasopharyngeal swab, presence of viral mutation(s) within the areas targeted by this assay, and inadequate number of viral copies(<138 copies/mL). A negative result must be combined with clinical observations, patient history, and epidemiological information. The expected result is Negative.  Fact Sheet for Patients:  EntrepreneurPulse.com.au  Fact Sheet for Healthcare Providers:  IncredibleEmployment.be  This test is no t yet approved or cleared by the [place] FDA and  has been authorized for detection and/or diagnosis of SARS-CoV-2 by FDA under an Emergency Use Authorization (EUA). This EUA will remain  in effect (meaning this test can be used) for the duration of the COVID-19 declaration under Section 564(b)(1) of the Act, 21 U.S.C.section 360bbb-3(b)(1), unless the authorization is terminated  or revoked sooner.       Influenza A by PCR NEGATIVE NEGATIVE Final   Influenza B by PCR NEGATIVE NEGATIVE Final    Comment: (NOTE) The Xpert Xpress SARS-CoV-2/FLU/RSV plus assay is intended as an aid in the diagnosis of influenza from  Nasopharyngeal swab specimens and should not be used as a sole basis for treatment.  Nasal washings and aspirates are unacceptable for Xpert Xpress SARS-CoV-2/FLU/RSV testing.  Fact Sheet for Patients: EntrepreneurPulse.com.au  Fact Sheet for Healthcare Providers: IncredibleEmployment.be  This test is not yet approved or cleared by the [place] FDA and has been authorized for detection and/or diagnosis of SARS-CoV-2 by FDA under an Emergency Use Authorization (EUA). This EUA will remain in effect (meaning this test can be used) for the duration of the COVID-19 declaration under Section 564(b)(1) of the Act, 21 U.S.C. section 360bbb-3(b)(1), unless the authorization is terminated or revoked.  Performed at [facility], [address] [place], [place] [address]     Today   Subjective    Avery [place]ling[place] today has no new complaints -Dysuria urinary frequency and urgency has resolved -No fevers no chills no CVA tenderness        -No productive cough, no chest pains no dyspnea  Patient has been seen and examined prior to discharge   Objective   Blood pressure (!) 145/72, pulse 92, temperature 98.5 F (36.9 C), resp. rate 20, height '5\' 10"'$  (1.778 m), weight 65.8 kg, SpO2 100 %.   Intake/Output Summary (Last 24 hours) at 09/03/2021 1542 Last data filed at 09/03/2021 1513 Gross per 24 hour  Intake 420 ml  Output --  Net 420 ml    Exam Gen:- Awake Alert, no acute distress  HEENT:- [place].AT, No sclera icterus Neck-Supple Neck,No JVD,.  Lungs-improved air movement, no wheezing  CV- S1, S2 normal, regular Abd-  +ve B.Sounds, Abd Soft, No tenderness, no CVA tenderness Extremity/Skin:- No  edema,   good pulses Psych-affect is appropriate, oriented x3... Concerns about some underlying cognitive/memory issues Neuro-generalized weakness no new focal deficits, no tremors    Data Review   CBC w Diff:  Lab Results  Component Value Date    WBC 7.5 09/03/2021   HGB 10.2 (L) 09/03/2021   HCT 30.6 (L) 09/03/2021   PLT 97 (L) 09/03/2021   LYMPHOPCT 5 08/31/2021   MONOPCT 10 08/31/2021   EOSPCT 0 08/31/2021   BASOPCT 0 08/31/2021    CMP:  Lab Results  Component Value Date   NA 140 09/03/2021   K 3.9 09/03/2021   CL 113 (H) 09/03/2021   CO2 22 09/03/2021   BUN 52 (H) 09/03/2021   CREATININE 2.67 (H) 09/03/2021   PROT 6.1 (L) 09/03/2021   ALBUMIN 2.9 (L) 09/03/2021   BILITOT 1.2 09/03/2021   ALKPHOS 63 09/03/2021   AST 61 (H) 09/03/2021   ALT 63 (H) 09/03/2021  .  Total Discharge time is about 33 minutes  Roxan Hockey M.D on 09/03/2021 at 3:42 PM  Go to [email or web address] -  for contact info  [facility] - Office  [phone]

## 2021-09-03 NOTE — Progress Notes (Signed)
Patient discharged home, transported home by family. Discharge paperwork went over with patients son Edd Arbour, son verbalized understanding. Belongings sent home with patient.

## 2021-09-03 NOTE — Progress Notes (Signed)
Contacted son Edd Arbour) regarding patient discharging today. Son stated he would be here after 7 pm to pick his father up. Will report to on-coming nurse.

## 2021-09-03 NOTE — Plan of Care (Signed)

## 2021-09-03 NOTE — Discharge Instructions (Signed)
1)Avoid ibuprofen/Advil/Aleve/Motrin/Goody Powders/Naproxen/BC powders/Meloxicam/Diclofenac/Indomethacin and other Nonsteroidal anti-inflammatory medications as these will make you more likely to bleed and can cause stomach ulcers, can also cause Kidney problems.   2)Repeat CBC and BMP Blood Test with primary care physician in about 4 to 5 days from now  3)Please note that there has been several changes to your medications

## 2021-09-04 LAB — URINE CULTURE: Culture: >=100000 — AB

## 2021-09-05 LAB — CULTURE, BLOOD (ROUTINE X 2)
Culture: NO GROWTH
Culture: NO GROWTH

## 2021-09-21 ENCOUNTER — Emergency Department (HOSPITAL_COMMUNITY)
Admission: EM | Admit: 2021-09-21 | Discharge: 2021-09-22 | Disposition: A | Discharge status: Home / Self Care | Payer: Medicare Other | Attending: Emergency Medicine | Admitting: Emergency Medicine

## 2021-09-21 ENCOUNTER — Other Ambulatory Visit: Payer: Self-pay

## 2021-09-21 ENCOUNTER — Encounter (HOSPITAL_COMMUNITY): Payer: Self-pay

## 2021-09-21 DIAGNOSIS — R799 Abnormal finding of blood chemistry, unspecified: Secondary | ICD-10-CM | POA: Diagnosis present

## 2021-09-21 DIAGNOSIS — N390 Urinary tract infection, site not specified: Secondary | ICD-10-CM | POA: Insufficient documentation

## 2021-09-21 DIAGNOSIS — E860 Dehydration: Secondary | ICD-10-CM | POA: Diagnosis not present

## 2021-09-21 DIAGNOSIS — B9689 Other specified bacterial agents as the cause of diseases classified elsewhere: Secondary | ICD-10-CM | POA: Diagnosis not present

## 2021-09-21 DIAGNOSIS — Z7982 Long term (current) use of aspirin: Secondary | ICD-10-CM | POA: Diagnosis not present

## 2021-09-21 LAB — CBC WITH DIFFERENTIAL/PLATELET
Abs Immature Granulocytes: 0.01 10*3/uL (ref 0.00–0.07)
Basophils Absolute: 0 10*3/uL (ref 0.0–0.1)
Basophils Relative: 1 %
Eosinophils Absolute: 0.2 10*3/uL (ref 0.0–0.5)
Eosinophils Relative: 4 %
HCT: 26 % — ABNORMAL LOW (ref 39.0–52.0)
Hemoglobin: 8.3 g/dL — ABNORMAL LOW (ref 13.0–17.0)
Immature Granulocytes: 0 %
Lymphocytes Relative: 21 %
Lymphs Abs: 0.9 10*3/uL (ref 0.7–4.0)
MCH: 27.2 pg (ref 26.0–34.0)
MCHC: 31.9 g/dL (ref 30.0–36.0)
MCV: 85.2 fL (ref 80.0–100.0)
Monocytes Absolute: 0.8 10*3/uL (ref 0.1–1.0)
Monocytes Relative: 18 %
Neutro Abs: 2.5 10*3/uL (ref 1.7–7.7)
Neutrophils Relative %: 56 %
Platelets: 112 10*3/uL — ABNORMAL LOW (ref 150–400)
RBC: 3.05 MIL/uL — ABNORMAL LOW (ref 4.22–5.81)
RDW: 14.7 % (ref 11.5–15.5)
WBC: 4.3 10*3/uL (ref 4.0–10.5)
nRBC: 0 % (ref 0.0–0.2)

## 2021-09-21 LAB — URINALYSIS, ROUTINE W REFLEX MICROSCOPIC
Bilirubin Urine: NEGATIVE
Glucose, UA: NEGATIVE mg/dL
Ketones, ur: NEGATIVE mg/dL
Nitrite: NEGATIVE
Protein, ur: 100 mg/dL — AB
Specific Gravity, Urine: 1.01 (ref 1.005–1.030)
WBC, UA: >50 WBC/hpf — ABNORMAL HIGH (ref 0–5)
pH: 5 (ref 5.0–8.0)

## 2021-09-21 LAB — BASIC METABOLIC PANEL
Anion gap: 7 (ref 5–15)
BUN: 56 mg/dL — ABNORMAL HIGH (ref 8–23)
CO2: 21 mmol/L — ABNORMAL LOW (ref 22–32)
Calcium: 8.3 mg/dL — ABNORMAL LOW (ref 8.9–10.3)
Chloride: 114 mmol/L — ABNORMAL HIGH (ref 98–111)
Creatinine, Ser: 3.3 mg/dL — ABNORMAL HIGH (ref 0.61–1.24)
GFR, Estimated: 18 mL/min — ABNORMAL LOW (ref >60)
Glucose, Bld: 162 mg/dL — ABNORMAL HIGH (ref 70–99)
Potassium: 4 mmol/L (ref 3.5–5.1)
Sodium: 142 mmol/L (ref 135–145)

## 2021-09-21 NOTE — ED Triage Notes (Signed)
Patient presents to ED via [facility] , alert and oriented to person and place.Patient has an history of bladder cancer. EMS reports that son received noticed from PCP that he has abnormal lab values. Patient denies chest pain or shortness of breath.

## 2021-09-22 MED ORDER — CEPHALEXIN 500 MG PO CAPS: 500.0000 mg | ORAL_CAPSULE | Freq: Two times a day (BID) | ORAL | 0 refills | Status: DC

## 2021-09-22 MED ORDER — SODIUM CHLORIDE 0.9 % IV SOLN
1.0000 g | Freq: Once | INTRAVENOUS | Status: AC
  Administered 2021-09-22: 1 g via INTRAVENOUS
  Filled 2021-09-22: qty 10

## 2021-09-22 MED ORDER — SODIUM CHLORIDE 0.9 % IV BOLUS
500.0000 mL | Freq: Once | INTRAVENOUS | Status: AC
  Administered 2021-09-22: 500 mL via INTRAVENOUS

## 2021-09-22 NOTE — ED Provider Notes (Signed)
[facility] EMERGENCY DEPARTMENT Provider Note   CSN: [id number] Arrival date & time: 09/21/21  2133     History  Chief Complaint  Patient presents with   Abnormal Lab    Isaac Hunter is a 81 y.o. male.  Patient sent to the emergency department by ambulance from home.  He is not sure why he is here.  EMS was told that he had lab work performed by his primary doctor and they called his son and told him that he had abnormal labs.  Its not clear what labs were abnormal.  Patient has no complaints at this time.       Home Medications Prior to Admission medications   Medication Sig Start Date End Date Taking? Authorizing Provider  acetaminophen (TYLENOL) 325 MG tablet Take 2 tablets (650 mg total) by mouth every 6 (six) hours as needed for up to 30 doses for mild pain. 12/07/19   Wyvonnia Dusky, MD  amLODipine (NORVASC) 10 MG tablet Take 1 tablet (10 mg total) by mouth daily. For BP 09/03/21 09/03/22  Roxan Hockey, MD  aspirin EC 81 MG tablet Take 1 tablet (81 mg total) by mouth daily with breakfast. 09/03/21   Roxan Hockey, MD  atorvastatin (LIPITOR) 80 MG tablet Take 1 tablet (80 mg total) by mouth daily. 09/12/21   Roxan Hockey, MD  carvedilol (COREG) 6.25 MG tablet Take 1 tablet (6.25 mg total) by mouth 2 (two) times daily with a meal. For BP 09/03/21   Emokpae, Courage, MD  Cholecalciferol 25 MCG (1000 UT) tablet Take 2,000 Units by mouth daily.     [provider]  FEROSUL 325 (65 Fe) MG tablet Take 325 mg by mouth daily with breakfast. 07/07/21   [provider]  losartan-hydrochlorothiazide (HYZAAR) 50-12.5 MG tablet Take 1 tablet by mouth daily. For BP 09/03/21 09/03/22  Roxan Hockey, MD  Melatonin 3 MG CAPS Take 3 mg by mouth daily as needed (sleep).    [provider]  nicotine (NICODERM CQ - DOSED IN MG/24 HOURS) 21 mg/24hr patch Place 1 patch (21 mg total) onto the skin daily. 09/04/21   Roxan Hockey, MD      Allergies     Patient has no known allergies.    Review of Systems   Review of Systems  Physical Exam Updated Vital Signs BP (!) 152/70   Pulse 70   Temp 98.4 F (36.9 C) (Oral)   Resp 20   Ht '5\' 10"'$  (1.778 m)   Wt 65.8 kg   SpO2 95%   BMI 20.81 kg/m  Physical Exam  ED Results / Procedures / Treatments   Labs (all labs ordered are listed, but only abnormal results are displayed) Labs Reviewed  BASIC METABOLIC PANEL - Abnormal; Notable for the following components:      Result Value   Chloride 114 (*)    CO2 21 (*)    Glucose, Bld 162 (*)    BUN 56 (*)    Creatinine, Ser 3.30 (*)    Calcium 8.3 (*)    GFR, Estimated 18 (*)    All other components within normal limits  CBC WITH DIFFERENTIAL/PLATELET - Abnormal; Notable for the following components:   RBC 3.05 (*)    Hemoglobin 8.3 (*)    HCT 26.0 (*)    Platelets 112 (*)    All other components within normal limits  URINALYSIS, ROUTINE W REFLEX MICROSCOPIC - Abnormal; Notable for the following components:   APPearance CLOUDY (*)  Hgb urine dipstick MODERATE (*)    Protein, ur 100 (*)    Leukocytes,Ua LARGE (*)    WBC, UA >50 (*)    Bacteria, UA FEW (*)    All other components within normal limits    EKG None  Radiology No results found.  Procedures Procedures    Medications Ordered in ED Medications  sodium chloride 0.9 % bolus 500 mL (0 mLs Intravenous Stopped 09/22/21 0337)  cefTRIAXone (ROCEPHIN) 1 g in sodium chloride 0.9 % 100 mL IVPB (0 g Intravenous Stopped 09/22/21 0256)    ED Course/ Medical Decision Making/ A&P                           Medical Decision Making  Sent to the ER by ambulance for evaluation of labs.  Patient reportedly had outpatient labs and something was abnormal.  He does have slightly increased creatinine from his baseline.  He has mild thrombocytopenia but this is at baseline.  Anemia is at about his baseline.  He is afebrile, no hypotension, no signs of infection.  Remainder of  vital signs unremarkable.  Remainder of lab work unremarkable other than signs of urinary tract infection.  Patient administered IV fluids for his mild increase in his creatinine.  Unfortunately we have been unable to get in touch with the patient's son throughout the night.  Patient administered Rocephin for urinary tract infection.  As he is stable, it is felt that he is appropriate for discharge, continue outpatient treatment for urinary tract infection, follow-up with primary doctor.        Final Clinical Impression(s) / ED Diagnoses Final diagnoses:  Lower urinary tract infectious disease  Dehydration    Rx / DC Orders ED Discharge Orders     None         Orpah Greek, MD 09/22/21 [phone]

## 2021-09-26 LAB — URINE CULTURE: Culture: >=100000 — AB

## 2021-09-27 ENCOUNTER — Telehealth: Payer: Self-pay | Admitting: *Deleted

## 2021-09-27 NOTE — Telephone Encounter (Signed)
Post ED Visit - Positive Culture Follow-up  Culture report reviewed by antimicrobial stewardship pharmacist: [place] Team '[]'$  Elenor Quinones, Pharm.D. '[]'$  Heide Guile, Pharm.D., BCPS AQ-ID '[]'$  Parks Neptune, Pharm.D., BCPS '[]'$  Alycia Rossetti, Pharm.D., BCPS '[]'$  [place], Pharm.D., BCPS, AAHIVP '[]'$  Legrand Como, Pharm.D., BCPS, AAHIVP '[]'$  Salome Arnt, PharmD, BCPS '[]'$  Johnnette Gourd, PharmD, BCPS '[]'$  Hughes Better, PharmD, BCPS '[]'$  Leeroy Cha, PharmD '[]'$  Laqueta Linden, PharmD, BCPS '[]'$  Albertina Parr, PharmD  [place] Team '[]'$  Leodis Sias, PharmD '[]'$  Lindell Spar, PharmD '[]'$  Royetta Asal, PharmD '[]'$  Graylin Shiver, Rph '[]'$  Rema Fendt) Glennon Mac, PharmD '[]'$  Arlyn Dunning, PharmD '[]'$  Netta Cedars, PharmD '[]'$  Dia Sitter, PharmD '[]'$  Leone Haven, PharmD '[]'$  Gretta Arab, PharmD '[]'$  Theodis Shove, PharmD '[]'$  Peggyann Juba, PharmD '[]'$  Reuel Boom, PharmD   Positive urine culture No UTI symptoms and no further patient follow-up is required at this time.  Gust Brooms, PA-C  [place] 09/27/2021, 9:27 AM

## 2021-09-27 NOTE — Progress Notes (Signed)
ED Antimicrobial Stewardship Positive Culture Follow Up   Isaac Hunter is an 81 y.o. male who presented to [facility] on 09/21/2021 with a chief complaint of  Chief Complaint  Patient presents with   Abnormal Lab    Recent Results (from the past 720 hour(s))  Culture, blood (Routine x 2)     Status: None   Collection Time: 08/31/21  7:15 PM   Specimen: BLOOD  Result Value Ref Range Status   Specimen Description BLOOD  Final   Special Requests NONE  Final   Culture   Final    NO GROWTH 5 DAYS Performed at [facility], [address] [place], [place] [address]    Report Status 09/05/2021 FINAL  Final  Culture, blood (Routine x 2)     Status: None   Collection Time: 08/31/21  9:29 PM   Specimen: BLOOD RIGHT ARM  Result Value Ref Range Status   Specimen Description BLOOD RIGHT ARM  Final   Special Requests   Final    BOTTLES DRAWN AEROBIC ONLY Blood Culture results may not be optimal due to an inadequate volume of blood received in culture bottles   Culture   Final    NO GROWTH 5 DAYS Performed at [facility], [address], [place], [place] [address]    Report Status 09/05/2021 FINAL  Final  Urine Culture     Status: Abnormal   Collection Time: 08/31/21  9:32 PM   Specimen: In/Out Cath Urine  Result Value Ref Range Status   Specimen Description   Final    IN/OUT CATH URINE Performed at [facility], [address], [place], [place] [address]    Special Requests   Final    NONE Performed at [facility], [address], [place],  [address]    Culture (A)  Final    >=100,000 COLONIES/mL PSEUDOMONAS AERUGINOSA 10,000 COLONIES/mL ENTEROCOCCUS FAECIUM VANCOMYCIN RESISTANT ENTEROCOCCUS ISOLATED    Report Status 09/04/2021 FINAL  Final   Organism ID, Bacteria PSEUDOMONAS AERUGINOSA (A)  Final   Organism ID, Bacteria ENTEROCOCCUS FAECIUM (A)  Final      Susceptibility   Enterococcus faecium - MIC*    AMPICILLIN >=32 RESISTANT Resistant     NITROFURANTOIN 256  RESISTANT Resistant     VANCOMYCIN >=32 RESISTANT Resistant     GENTAMICIN SYNERGY SENSITIVE Sensitive     LINEZOLID 2 SENSITIVE Sensitive     * 10,000 COLONIES/mL ENTEROCOCCUS FAECIUM   Pseudomonas aeruginosa - MIC*    CEFTAZIDIME 4 SENSITIVE Sensitive     CIPROFLOXACIN <=0.25 SENSITIVE Sensitive     GENTAMICIN 2 SENSITIVE Sensitive     IMIPENEM 1 SENSITIVE Sensitive     PIP/TAZO 8 SENSITIVE Sensitive     CEFEPIME 2 SENSITIVE Sensitive     * >=100,000 COLONIES/mL PSEUDOMONAS AERUGINOSA  Resp Panel by RT-PCR (Flu A&B, Covid) Anterior Nasal Swab     Status: None   Collection Time: 09/01/21 12:14 AM   Specimen: Anterior Nasal Swab  Result Value Ref Range Status   SARS Coronavirus 2 by RT PCR NEGATIVE NEGATIVE Final    Comment: (NOTE) SARS-CoV-2 target nucleic acids are NOT DETECTED.  The SARS-CoV-2 RNA is generally detectable in upper respiratory specimens during the acute phase of infection. The lowest concentration of SARS-CoV-2 viral copies this assay can detect is 138 copies/mL. A negative result does not preclude SARS-Cov-2 infection and should not be used as the sole basis for treatment or other patient management decisions. A negative result  may occur with  improper specimen collection/handling, submission of specimen other than nasopharyngeal swab, presence of viral mutation(s) within the areas targeted by this assay, and inadequate number of viral copies(<138 copies/mL). A negative result must be combined with clinical observations, patient history, and epidemiological information. The expected result is Negative.  Fact Sheet for Patients:  EntrepreneurPulse.com.au  Fact Sheet for Healthcare Providers:  IncredibleEmployment.be  This test is no t yet approved or cleared by the [place] FDA and  has been authorized for detection and/or diagnosis of SARS-CoV-2 by FDA under an Emergency Use Authorization (EUA). This EUA will remain   in effect (meaning this test can be used) for the duration of the COVID-19 declaration under Section 564(b)(1) of the Act, 21 U.S.C.section 360bbb-3(b)(1), unless the authorization is terminated  or revoked sooner.       Influenza A by PCR NEGATIVE NEGATIVE Final   Influenza B by PCR NEGATIVE NEGATIVE Final    Comment: (NOTE) The Xpert Xpress SARS-CoV-2/FLU/RSV plus assay is intended as an aid in the diagnosis of influenza from Nasopharyngeal swab specimens and should not be used as a sole basis for treatment. Nasal washings and aspirates are unacceptable for Xpert Xpress SARS-CoV-2/FLU/RSV testing.  Fact Sheet for Patients: EntrepreneurPulse.com.au  Fact Sheet for Healthcare Providers: IncredibleEmployment.be  This test is not yet approved or cleared by the [place] FDA and has been authorized for detection and/or diagnosis of SARS-CoV-2 by FDA under an Emergency Use Authorization (EUA). This EUA will remain in effect (meaning this test can be used) for the duration of the COVID-19 declaration under Section 564(b)(1) of the Act, 21 U.S.C. section 360bbb-3(b)(1), unless the authorization is terminated or revoked.  Performed at [facility], [address] [place], [place] [address]   Urine Culture     Status: Abnormal   Collection Time: 09/22/21  7:48 AM   Specimen: Urine, Clean Catch  Result Value Ref Range Status   Specimen Description   Final    URINE, CLEAN CATCH Performed at [facility], [address] [place], [place] [address]    Special Requests   Final    NONE Performed at [facility], [address], [place],  [address]    Culture (A)  Final    >=100,000 COLONIES/mL ESCHERICHIA COLI 40,000 COLONIES/mL KLEBSIELLA PNEUMONIAE Confirmed Extended Spectrum Beta-Lactamase Producer (ESBL).  In bloodstream infections from ESBL organisms, carbapenems are preferred over piperacillin/tazobactam. They are shown to have a  lower risk of mortality. FOR ECOLI Performed at [facility], [place] [address], [place],  [address]    Report Status 09/26/2021 FINAL  Final   Organism ID, Bacteria KLEBSIELLA PNEUMONIAE (A)  Final   Organism ID, Bacteria ESCHERICHIA COLI (A)  Final      Susceptibility   Escherichia coli - MIC*    AMPICILLIN >=32 RESISTANT Resistant     CEFAZOLIN >=64 RESISTANT Resistant     CEFEPIME >=32 RESISTANT Resistant     CEFTRIAXONE >=64 RESISTANT Resistant     CIPROFLOXACIN >=4 RESISTANT Resistant     GENTAMICIN >=16 RESISTANT Resistant     IMIPENEM <=0.25 SENSITIVE Sensitive     NITROFURANTOIN 64 INTERMEDIATE Intermediate     TRIMETH/SULFA <=20 SENSITIVE Sensitive     AMPICILLIN/SULBACTAM >=32 RESISTANT Resistant     PIP/TAZO 8 SENSITIVE Sensitive     * >=100,000 COLONIES/mL ESCHERICHIA COLI   Klebsiella pneumoniae - MIC*    AMPICILLIN >=32 RESISTANT Resistant     CEFAZOLIN <=4 SENSITIVE Sensitive  CEFEPIME <=0.12 SENSITIVE Sensitive     CEFTRIAXONE <=0.25 SENSITIVE Sensitive     CIPROFLOXACIN <=0.25 SENSITIVE Sensitive     GENTAMICIN <=1 SENSITIVE Sensitive     IMIPENEM 0.5 SENSITIVE Sensitive     NITROFURANTOIN 64 INTERMEDIATE Intermediate     TRIMETH/SULFA <=20 SENSITIVE Sensitive     AMPICILLIN/SULBACTAM 16 INTERMEDIATE Intermediate     PIP/TAZO <=4 SENSITIVE Sensitive     * 40,000 COLONIES/mL KLEBSIELLA PNEUMONIAE    '[x]'$  Treated with cephalexin, organism resistant to prescribed antimicrobial '[]'$  Patient discharged originally without antimicrobial agent and treatment is now indicated  New antibiotic prescription: Symptoms check - If pt has UTI symptoms or feeling poorly, he should call PCP or return to the ED. Would recommend against treatment with bactrim due to poor kidney function  ED Provider: Janeece Fitting PA-c   [place], Rande Lawman 09/27/2021, 8:56 AM Clinical Pharmacist Monday - Friday phone -  [phone] Saturday - Sunday phone - [phone]

## 2021-10-07 IMAGING — DX DG HAND COMPLETE 3+V*R*
3 series · 3 of 3 positions shown · non-contrast
Comparison: None.

CLINICAL DATA: Second and third metacarpal swelling bilateral
hands. Fall this afternoon attempted to catch himself, now with
bilateral hand and wrist pain.

EXAM:
RIGHT HAND - COMPLETE 3+ VIEW

[hand pa]
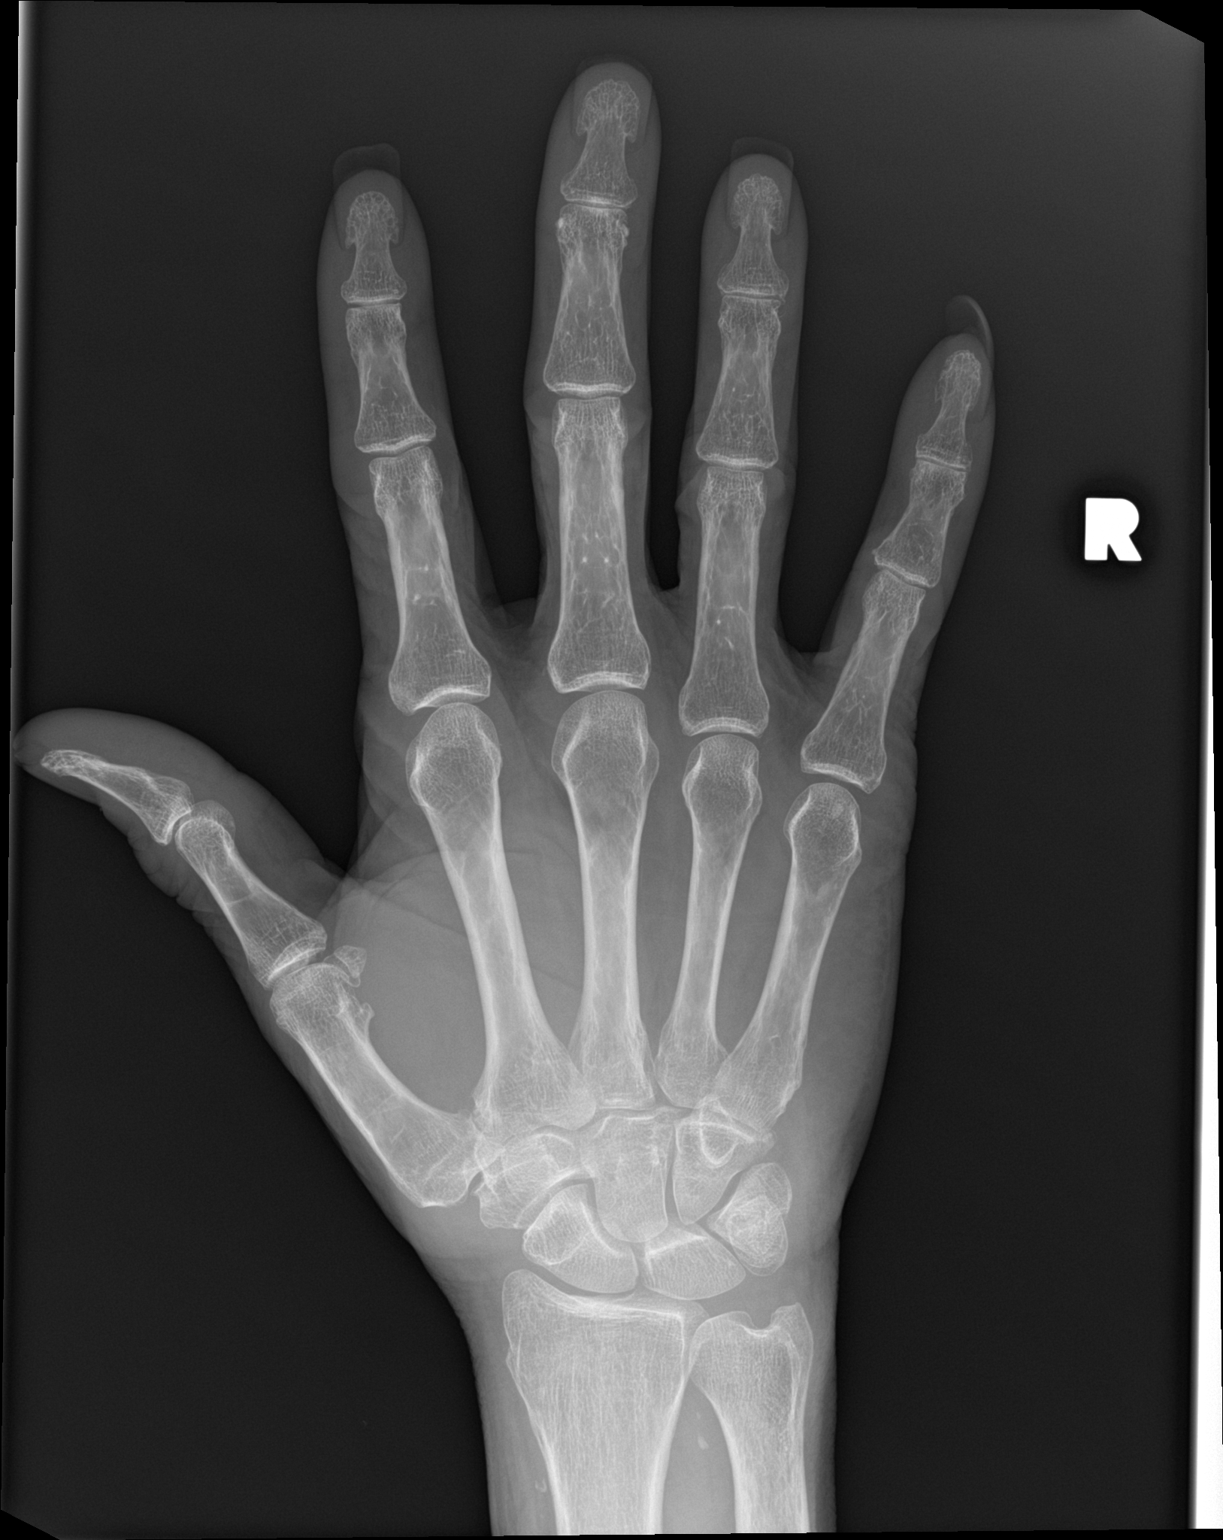

[hand obl]
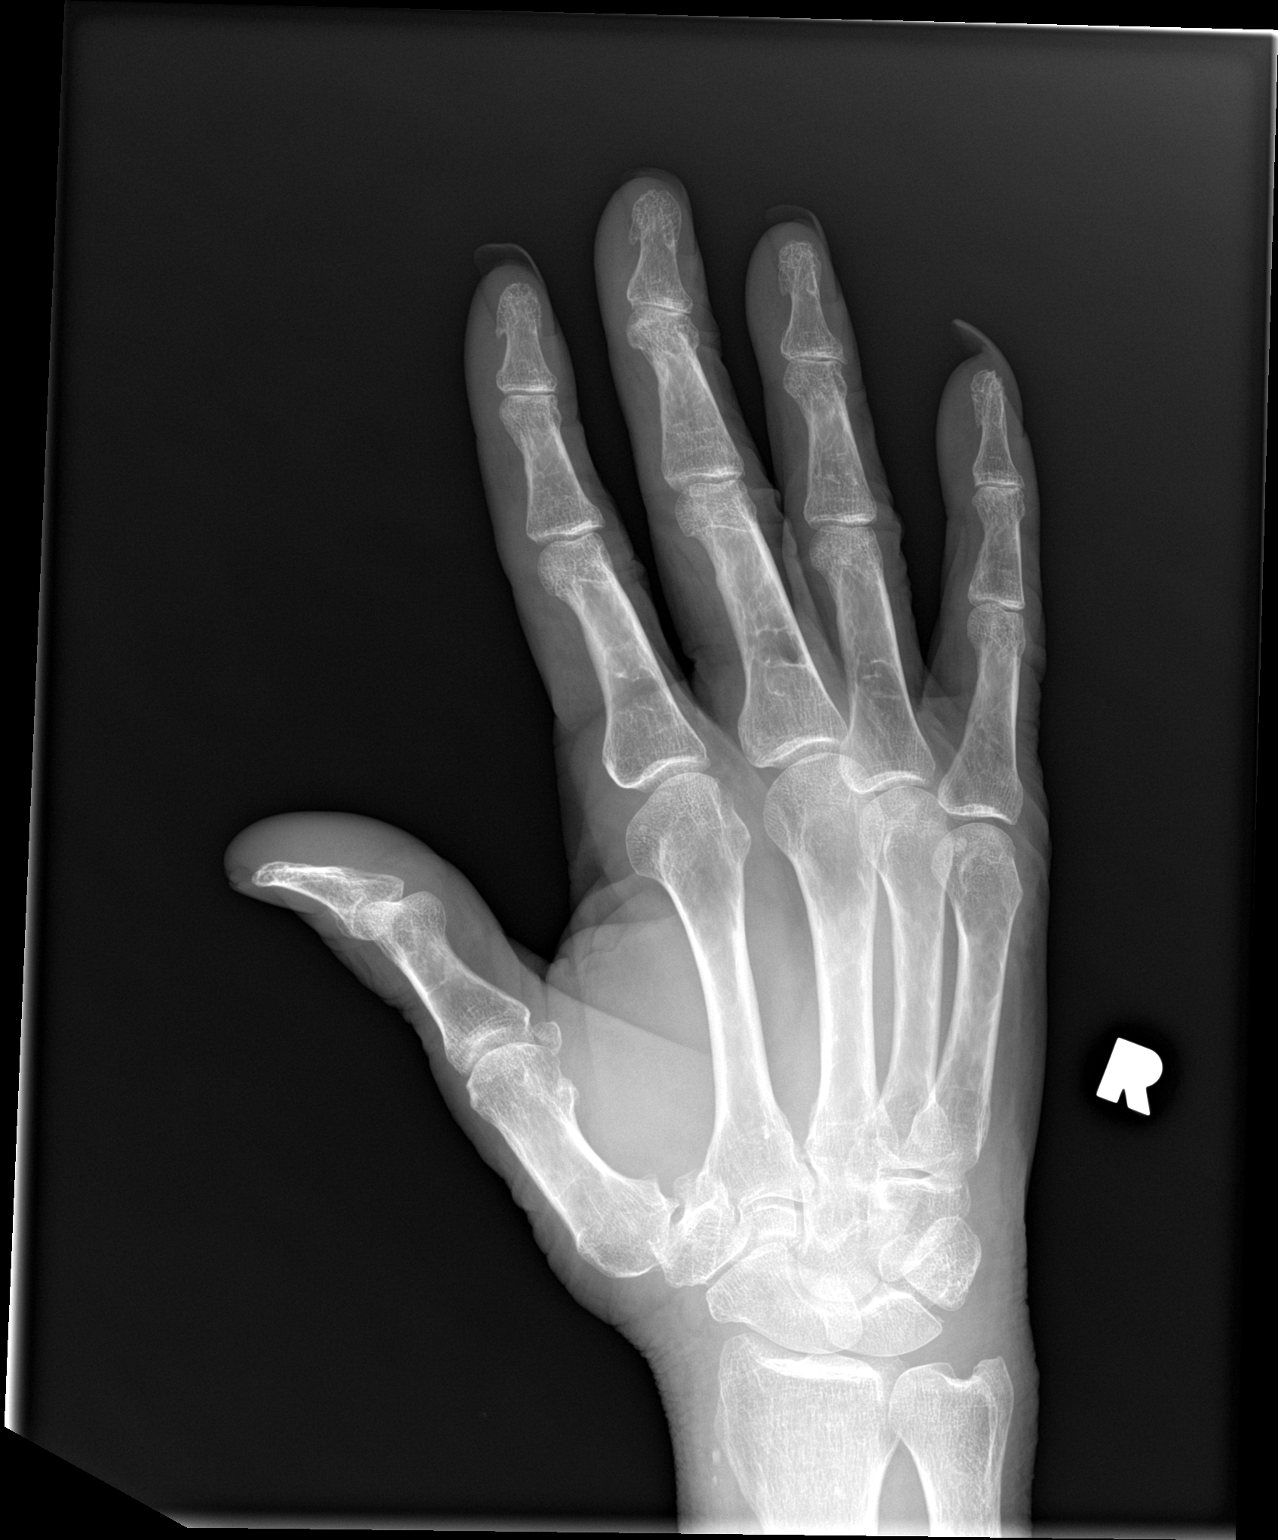

[hand lat]
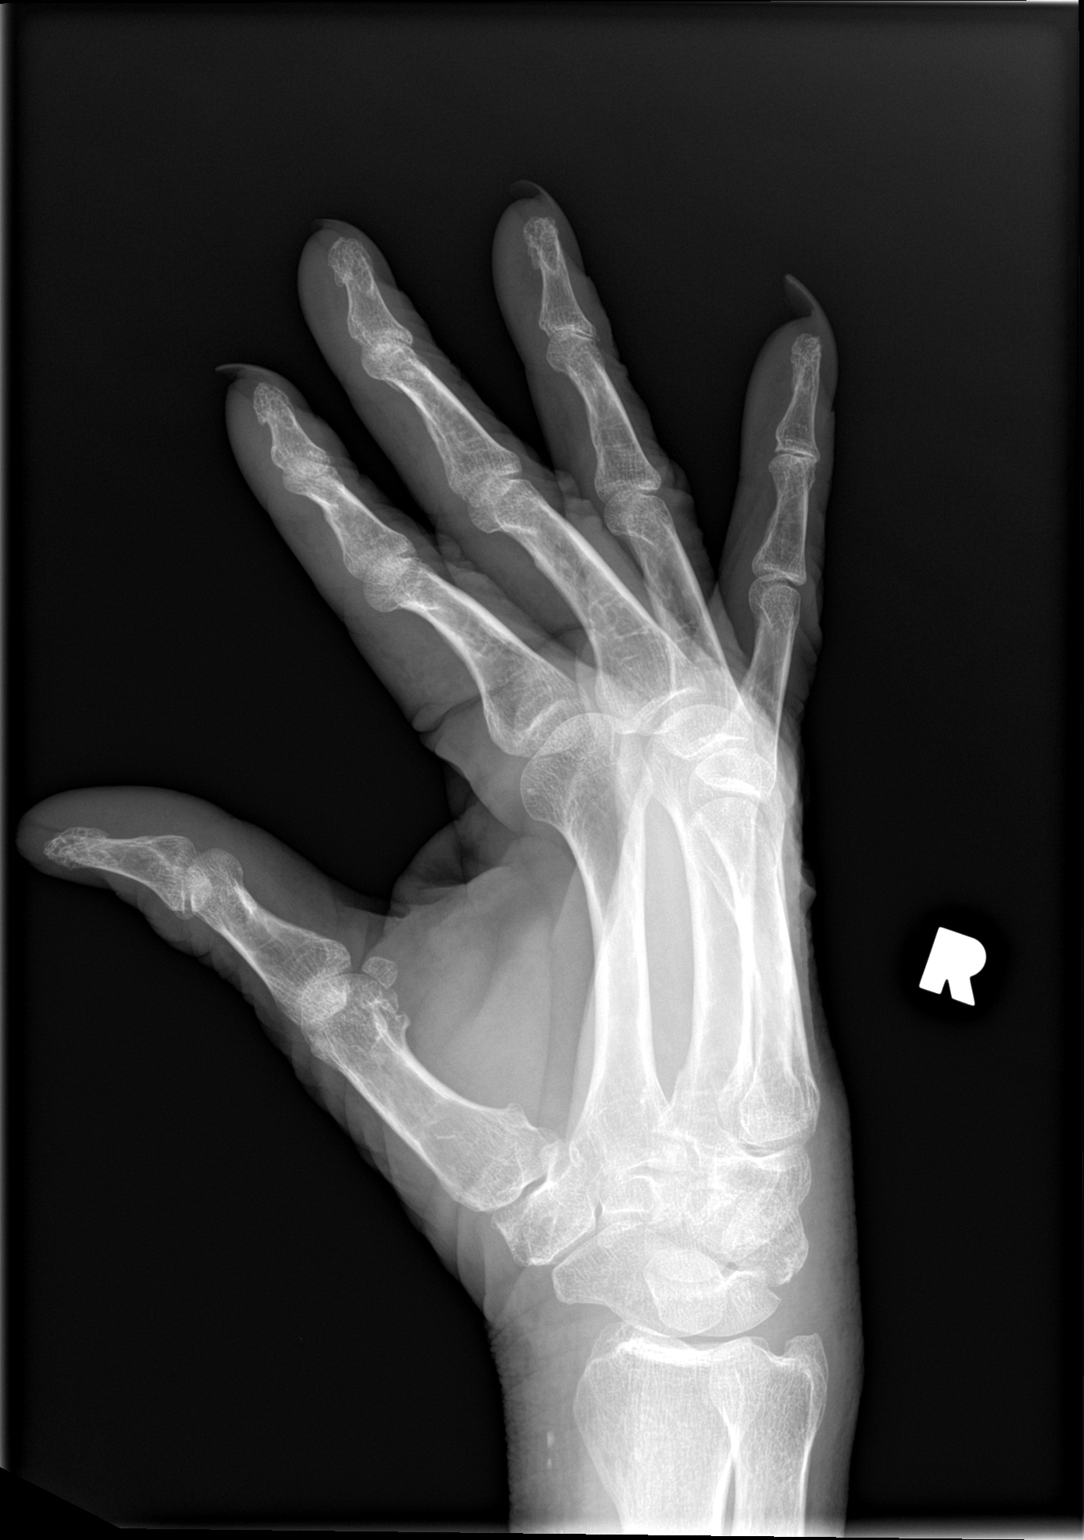

[3 of 3 positions shown; findings below may reference images not displayed]

FINDINGS: Triquetral fracture on concurrent wrist radiograph is not well seen
on the provided views. No other evidence of fracture of the hand.
Occasional osteoarthritis of the digits. The metacarpal phalangeal
joints appear normal. No erosion or periosteal reaction. No focal
soft tissue abnormality.
IMPRESSION: Triquetral fracture on concurrent wrist radiograph is not well seen
on the provided views. No additional fracture of the hand.

## 2022-03-05 ENCOUNTER — Inpatient Hospital Stay (HOSPITAL_COMMUNITY)
Admission: EM | Admit: 2022-03-05 | Discharge: 2022-03-07 | DRG: 689 | Disposition: A | Discharge status: Home Health | Payer: 59 | Attending: Family Medicine | Admitting: Family Medicine

## 2022-03-05 ENCOUNTER — Other Ambulatory Visit: Payer: Self-pay

## 2022-03-05 ENCOUNTER — Emergency Department (HOSPITAL_COMMUNITY): Payer: 59

## 2022-03-05 ENCOUNTER — Encounter (HOSPITAL_COMMUNITY): Payer: Self-pay

## 2022-03-05 DIAGNOSIS — N3000 Acute cystitis without hematuria: Secondary | ICD-10-CM

## 2022-03-05 DIAGNOSIS — F0390 Unspecified dementia without behavioral disturbance: Secondary | ICD-10-CM | POA: Diagnosis present

## 2022-03-05 DIAGNOSIS — N390 Urinary tract infection, site not specified: Principal | ICD-10-CM | POA: Insufficient documentation

## 2022-03-05 DIAGNOSIS — G9341 Metabolic encephalopathy: Secondary | ICD-10-CM | POA: Diagnosis present

## 2022-03-05 DIAGNOSIS — I2510 Atherosclerotic heart disease of native coronary artery without angina pectoris: Secondary | ICD-10-CM | POA: Diagnosis present

## 2022-03-05 DIAGNOSIS — I10 Essential (primary) hypertension: Secondary | ICD-10-CM | POA: Diagnosis present

## 2022-03-05 DIAGNOSIS — E782 Mixed hyperlipidemia: Secondary | ICD-10-CM | POA: Diagnosis present

## 2022-03-05 DIAGNOSIS — F17210 Nicotine dependence, cigarettes, uncomplicated: Secondary | ICD-10-CM | POA: Diagnosis present

## 2022-03-05 DIAGNOSIS — Z8551 Personal history of malignant neoplasm of bladder: Secondary | ICD-10-CM | POA: Diagnosis not present

## 2022-03-05 DIAGNOSIS — N3001 Acute cystitis with hematuria: Secondary | ICD-10-CM

## 2022-03-05 DIAGNOSIS — Z720 Tobacco use: Secondary | ICD-10-CM | POA: Diagnosis not present

## 2022-03-05 DIAGNOSIS — I129 Hypertensive chronic kidney disease with stage 1 through stage 4 chronic kidney disease, or unspecified chronic kidney disease: Secondary | ICD-10-CM | POA: Diagnosis present

## 2022-03-05 DIAGNOSIS — Z79899 Other long term (current) drug therapy: Secondary | ICD-10-CM

## 2022-03-05 DIAGNOSIS — D696 Thrombocytopenia, unspecified: Secondary | ICD-10-CM | POA: Diagnosis present

## 2022-03-05 DIAGNOSIS — Z1612 Extended spectrum beta lactamase (ESBL) resistance: Secondary | ICD-10-CM | POA: Diagnosis present

## 2022-03-05 DIAGNOSIS — B9620 Unspecified Escherichia coli [E. coli] as the cause of diseases classified elsewhere: Secondary | ICD-10-CM | POA: Diagnosis present

## 2022-03-05 DIAGNOSIS — N184 Chronic kidney disease, stage 4 (severe): Secondary | ICD-10-CM | POA: Insufficient documentation

## 2022-03-05 DIAGNOSIS — Z7982 Long term (current) use of aspirin: Secondary | ICD-10-CM | POA: Diagnosis not present

## 2022-03-05 DIAGNOSIS — Z1152 Encounter for screening for COVID-19: Secondary | ICD-10-CM | POA: Diagnosis not present

## 2022-03-05 DIAGNOSIS — R4182 Altered mental status, unspecified: Secondary | ICD-10-CM | POA: Diagnosis present

## 2022-03-05 DIAGNOSIS — D508 Other iron deficiency anemias: Secondary | ICD-10-CM | POA: Diagnosis not present

## 2022-03-05 DIAGNOSIS — E860 Dehydration: Secondary | ICD-10-CM | POA: Diagnosis present

## 2022-03-05 DIAGNOSIS — R410 Disorientation, unspecified: Principal | ICD-10-CM

## 2022-03-05 DIAGNOSIS — D509 Iron deficiency anemia, unspecified: Secondary | ICD-10-CM | POA: Insufficient documentation

## 2022-03-05 LAB — GLUCOSE, CAPILLARY
Glucose-Capillary: 110 mg/dL — ABNORMAL HIGH (ref 70–99)
Glucose-Capillary: 145 mg/dL — ABNORMAL HIGH (ref 70–99)
Glucose-Capillary: 137 mg/dL — ABNORMAL HIGH (ref 70–99)
Glucose-Capillary: 187 mg/dL — ABNORMAL HIGH (ref 70–99)

## 2022-03-05 LAB — URINALYSIS, W/ REFLEX TO CULTURE (INFECTION SUSPECTED)
Bacteria, UA: NONE SEEN
Bilirubin Urine: NEGATIVE
Glucose, UA: NEGATIVE mg/dL
Ketones, ur: NEGATIVE mg/dL
Nitrite: NEGATIVE
Protein, ur: 100 mg/dL — AB
RBC / HPF: >50 RBC/hpf (ref 0–5)
Specific Gravity, Urine: 1.01 (ref 1.005–1.030)
pH: 7 (ref 5.0–8.0)

## 2022-03-05 LAB — COMPREHENSIVE METABOLIC PANEL
ALT: 15 U/L (ref 0–44)
AST: 27 U/L (ref 15–41)
Albumin: 3.7 g/dL (ref 3.5–5.0)
Alkaline Phosphatase: 59 U/L (ref 38–126)
Anion gap: 9 (ref 5–15)
BUN: 47 mg/dL — ABNORMAL HIGH (ref 8–23)
CO2: 22 mmol/L (ref 22–32)
Calcium: 9 mg/dL (ref 8.9–10.3)
Chloride: 108 mmol/L (ref 98–111)
Creatinine, Ser: 2.81 mg/dL — ABNORMAL HIGH (ref 0.61–1.24)
GFR, Estimated: 22 mL/min — ABNORMAL LOW (ref >60)
Glucose, Bld: 104 mg/dL — ABNORMAL HIGH (ref 70–99)
Potassium: 4.4 mmol/L (ref 3.5–5.1)
Sodium: 139 mmol/L (ref 135–145)
Total Bilirubin: 0.9 mg/dL (ref 0.3–1.2)
Total Protein: 7.2 g/dL (ref 6.5–8.1)

## 2022-03-05 LAB — CBC WITH DIFFERENTIAL/PLATELET
Abs Immature Granulocytes: 0.05 10*3/uL (ref 0.00–0.07)
Basophils Absolute: 0 10*3/uL (ref 0.0–0.1)
Basophils Relative: 0 %
Eosinophils Absolute: 0 10*3/uL (ref 0.0–0.5)
Eosinophils Relative: 0 %
HCT: 30.4 % — ABNORMAL LOW (ref 39.0–52.0)
Hemoglobin: 9.7 g/dL — ABNORMAL LOW (ref 13.0–17.0)
Immature Granulocytes: 1 %
Lymphocytes Relative: 4 %
Lymphs Abs: 0.4 10*3/uL — ABNORMAL LOW (ref 0.7–4.0)
MCH: 25.3 pg — ABNORMAL LOW (ref 26.0–34.0)
MCHC: 31.9 g/dL (ref 30.0–36.0)
MCV: 79.4 fL — ABNORMAL LOW (ref 80.0–100.0)
Monocytes Absolute: 0.9 10*3/uL (ref 0.1–1.0)
Monocytes Relative: 9 %
Neutro Abs: 8.4 10*3/uL — ABNORMAL HIGH (ref 1.7–7.7)
Neutrophils Relative %: 86 %
Platelets: 67 10*3/uL — ABNORMAL LOW (ref 150–400)
RBC: 3.83 MIL/uL — ABNORMAL LOW (ref 4.22–5.81)
RDW: 16 % — ABNORMAL HIGH (ref 11.5–15.5)
WBC: 9.8 10*3/uL (ref 4.0–10.5)
nRBC: 0 % (ref 0.0–0.2)

## 2022-03-05 LAB — RESP PANEL BY RT-PCR (RSV, FLU A&B, COVID)  RVPGX2
Influenza A by PCR: NEGATIVE
Influenza B by PCR: NEGATIVE
Resp Syncytial Virus by PCR: NEGATIVE
SARS Coronavirus 2 by RT PCR: NEGATIVE

## 2022-03-05 LAB — RAPID URINE DRUG SCREEN, HOSP PERFORMED
Amphetamines: NOT DETECTED
Barbiturates: NOT DETECTED
Benzodiazepines: NOT DETECTED
Cocaine: NOT DETECTED
Opiates: NOT DETECTED
Tetrahydrocannabinol: NOT DETECTED

## 2022-03-05 LAB — IRON AND TIBC
Iron: 50 ug/dL (ref 45–182)
Saturation Ratios: 14 % — ABNORMAL LOW (ref 17.9–39.5)
TIBC: 367 ug/dL (ref 250–450)
UIBC: 317 ug/dL

## 2022-03-05 LAB — PROTIME-INR
INR: 1.2 (ref 0.8–1.2)
Prothrombin Time: 15 seconds (ref 11.4–15.2)

## 2022-03-05 LAB — FERRITIN: Ferritin: 40 ng/mL (ref 24–336)

## 2022-03-05 LAB — LACTIC ACID, PLASMA: Lactic Acid, Venous: 1.6 mmol/L (ref 0.5–1.9)

## 2022-03-05 LAB — CK: Total CK: 97 U/L (ref 49–397)

## 2022-03-05 LAB — APTT: aPTT: 35 seconds (ref 24–36)

## 2022-03-05 LAB — PHOSPHORUS: Phosphorus: 3 mg/dL (ref 2.5–4.6)

## 2022-03-05 LAB — MAGNESIUM: Magnesium: 2.2 mg/dL (ref 1.7–2.4)

## 2022-03-05 LAB — ETHANOL: Alcohol, Ethyl (B): <10 mg/dL (ref <10)

## 2022-03-05 MED ORDER — FLUCONAZOLE 100 MG PO TABS
200.0000 mg | ORAL_TABLET | ORAL | Status: DC
  Administered 2022-03-05: 200 mg via ORAL
  Filled 2022-03-05: qty 2

## 2022-03-05 MED ORDER — ONDANSETRON HCL 4 MG PO TABS: 4.0000 mg | ORAL_TABLET | Freq: Four times a day (QID) | ORAL | Status: DC | PRN

## 2022-03-05 MED ORDER — SODIUM CHLORIDE 0.9 % IV SOLN: INTRAVENOUS | Status: AC

## 2022-03-05 MED ORDER — ONDANSETRON HCL 4 MG/2ML IJ SOLN: 4.0000 mg | Freq: Four times a day (QID) | INTRAMUSCULAR | Status: DC | PRN

## 2022-03-05 MED ORDER — SODIUM CHLORIDE 0.9 % IV BOLUS
1000.0000 mL | Freq: Once | INTRAVENOUS | Status: AC
  Administered 2022-03-05: 1000 mL via INTRAVENOUS

## 2022-03-05 MED ORDER — SODIUM CHLORIDE 0.9 % IV SOLN
1.0000 g | INTRAVENOUS | Status: DC
  Administered 2022-03-06 – 2022-03-07 (×2): 1 g via INTRAVENOUS
  Filled 2022-03-05 (×2): qty 10

## 2022-03-05 MED ORDER — CARVEDILOL 3.125 MG PO TABS
6.2500 mg | ORAL_TABLET | Freq: Two times a day (BID) | ORAL | Status: DC
  Administered 2022-03-05 – 2022-03-07 (×6): 6.25 mg via ORAL
  Filled 2022-03-05 (×6): qty 2

## 2022-03-05 MED ORDER — SODIUM CHLORIDE 0.9 % IV SOLN
1.0000 g | Freq: Once | INTRAVENOUS | Status: AC
  Administered 2022-03-05: 1 g via INTRAVENOUS
  Filled 2022-03-05: qty 10

## 2022-03-05 MED ORDER — FERROUS SULFATE 325 (65 FE) MG PO TABS
325.0000 mg | ORAL_TABLET | Freq: Every day | ORAL | Status: DC
  Administered 2022-03-05 – 2022-03-07 (×3): 325 mg via ORAL
  Filled 2022-03-05 (×3): qty 1

## 2022-03-05 MED ORDER — ACETAMINOPHEN 650 MG RE SUPP: 650.0000 mg | Freq: Four times a day (QID) | RECTAL | Status: DC | PRN

## 2022-03-05 MED ORDER — NICOTINE 21 MG/24HR TD PT24
21.0000 mg | MEDICATED_PATCH | Freq: Every day | TRANSDERMAL | Status: DC
  Administered 2022-03-05 – 2022-03-07 (×3): 21 mg via TRANSDERMAL
  Filled 2022-03-05 (×3): qty 1

## 2022-03-05 MED ORDER — ATORVASTATIN CALCIUM 40 MG PO TABS
80.0000 mg | ORAL_TABLET | Freq: Every day | ORAL | Status: DC
  Administered 2022-03-05 – 2022-03-07 (×3): 80 mg via ORAL
  Filled 2022-03-05 (×3): qty 2

## 2022-03-05 MED ORDER — AMLODIPINE BESYLATE 5 MG PO TABS
10.0000 mg | ORAL_TABLET | Freq: Every day | ORAL | Status: DC
  Administered 2022-03-05 – 2022-03-07 (×3): 10 mg via ORAL
  Filled 2022-03-05 (×3): qty 2

## 2022-03-05 MED ORDER — ACETAMINOPHEN 325 MG PO TABS: 650.0000 mg | ORAL_TABLET | Freq: Four times a day (QID) | ORAL | Status: DC | PRN

## 2022-03-05 NOTE — H&P (Signed)
History and Physical    Patient: Isaac Hunter ZOX:[id number] DOB: 04-17-1940 DOA: 03/05/2022 DOS: the patient was seen and examined on 03/05/2022 PCP: [facility]  Patient coming from: Home  Chief Complaint:  Chief Complaint  Patient presents with   Altered Mental Status   HPI: Isaac Hunter is a 82 y.o. male with medical history significant of hypertension, hyperlipidemia, anemia of chronic disease, CKD stage IV, bladder cancer s/p resection (2012), thrombocytopenia, CAD who presents to the emergency department from home via EMS due to altered mental status.  Patient was unable to provide history due to altered mental status, history was obtained from ED physician and ED medical record.  Per report, patient presents with generalized weakness and altered mental status lethargy and ongoing for several days, he lives alone and ambulates with a cane at baseline.  Family activated EMS, on arrival of EMS team, patient was found to be incontinent of urine and was covered in urine and feces on arrival to the ED.  ED Course:  In the emergency department, BP was 166/69 on arrival to the ED and other vital signs were within normal range.  Workup in the ED showed WBC of 9.8, microcytic anemia, thrombocytopenia, BMP was normal except for blood glucose of 104 and BUN/creatinine 47/2.81.  Lactic acid 1.6, alcohol level was less than 10, total CK was 97, urine drug screen was normal, urinalysis was positive for hematuria and small leukocytes.  Influenza A, B, SARS coronavirus 2, RSV was negative, blood culture pending CT abdomen and pelvis without contrast showed mild bladder wall thickening which may be due to obstructive uropathy versus cystitis Chest x-ray showed no evidence of acute cardiopulmonary disease CT head without contrast showed no evidence of acute intracranial abnormality Patient was treated with IV ceftriaxone, IV hydration of 1 L NS was provided.  Hospitalist was  asked to admit patient for further evaluation and management.  Review of Systems: Review of systems as noted in the HPI. All other systems reviewed and are negative.   Past Medical History:  Diagnosis Date   CAD (coronary artery disease)    Non obstructive per cath 2001.   Diabetes mellitus    hx per pt, no longer has?   Hypertension    hx per pt, no long has??   Tobacco abuse 11/17/2010   Past Surgical History:  Procedure Laterality Date   CYSTOSCOPY  11/21/2010   Procedure: CYSTOSCOPY;  Surgeon: Marissa Nestle;  Location: [facility];  Service: Urology;  Laterality: N/A;   Left hand surgery     TRANSURETHRAL RESECTION OF BLADDER TUMOR  11/21/2010   Procedure: TRANSURETHRAL RESECTION OF BLADDER TUMOR (TURBT);  Surgeon: Marissa Nestle;  Location: [facility];  Service: Urology;  Laterality: N/A;  evacuation of clots    Social History:  reports that he has been smoking cigarettes. He has a 50.00 pack-year smoking history. He has never used smokeless tobacco. He reports that he does not drink alcohol and does not use drugs.   No Known Allergies  Family History  Problem Relation Age of Onset   Anesthesia problems Neg Hx    Hypotension Neg Hx    Malignant hyperthermia Neg Hx    Pseudochol deficiency Neg Hx      Prior to Admission medications   Medication Sig Start Date End Date Taking? Authorizing Provider  acetaminophen (TYLENOL) 325 MG tablet Take 2 tablets (650 mg total) by mouth every 6 (six) hours as needed for up to  30 doses for mild pain. 12/07/19   Wyvonnia Dusky, MD  amLODipine (NORVASC) 10 MG tablet Take 1 tablet (10 mg total) by mouth daily. For BP 09/03/21 09/03/22  Roxan Hockey, MD  aspirin EC 81 MG tablet Take 1 tablet (81 mg total) by mouth daily with breakfast. 09/03/21   Roxan Hockey, MD  atorvastatin (LIPITOR) 80 MG tablet Take 1 tablet (80 mg total) by mouth daily. 09/12/21   Roxan Hockey, MD  carvedilol (COREG) 6.25 MG tablet Take 1 tablet (6.25 mg  total) by mouth 2 (two) times daily with a meal. For BP 09/03/21   Emokpae, Courage, MD  cephALEXin (KEFLEX) 500 MG capsule Take 1 capsule (500 mg total) by mouth 2 (two) times daily. 09/22/21   Orpah Greek, MD  Cholecalciferol 25 MCG (1000 UT) tablet Take 2,000 Units by mouth daily.     [provider]  FEROSUL 325 (65 Fe) MG tablet Take 325 mg by mouth daily with breakfast. 07/07/21   [provider]  losartan-hydrochlorothiazide (HYZAAR) 50-12.5 MG tablet Take 1 tablet by mouth daily. For BP 09/03/21 09/03/22  Roxan Hockey, MD  Melatonin 3 MG CAPS Take 3 mg by mouth daily as needed (sleep).    [provider]  nicotine (NICODERM CQ - DOSED IN MG/24 HOURS) 21 mg/24hr patch Place 1 patch (21 mg total) onto the skin daily. 09/04/21   Roxan Hockey, MD    Physical Exam: BP (!) 155/68 (BP Location: Right Arm)   Pulse 78   Temp 98.5 F (36.9 C) (Oral)   Resp 18   Ht '5\' 10"'$  (1.778 m)   Wt 63.5 kg   SpO2 97%   BMI 20.09 kg/m   General: 81 y.o. year-old male ill appearing, but in no acute distress.  Alert and oriented x3. HEENT: NCAT, EOMI Neck: Supple, trachea medial Cardiovascular: Regular rate and rhythm with no rubs or gallops.  No thyromegaly or JVD noted.  No lower extremity edema. 2/4 pulses in all 4 extremities. Respiratory: Clear to auscultation with no wheezes or rales. Good inspiratory effort. Abdomen: Soft, nontender nondistended with normal bowel sounds x4 quadrants. Muskuloskeletal: No cyanosis, clubbing or edema noted bilaterally Neuro: CN II-XII intact, strength 5/5 x 4, sensation, reflexes intact Skin: No ulcerative lesions noted or rashes Psychiatry: Mood is appropriate for condition and setting          Labs on Admission:  Basic Metabolic Panel: Recent Labs  Lab 03/05/22 0240  NA 139  K 4.4  CL 108  CO2 22  GLUCOSE 104*  BUN 47*  CREATININE 2.81*  CALCIUM 9.0   Liver Function Tests: Recent Labs  Lab 03/05/22 0240   AST 27  ALT 15  ALKPHOS 59  BILITOT 0.9  PROT 7.2  ALBUMIN 3.7   No results for input(s): "LIPASE", "AMYLASE" in the last 168 hours. No results for input(s): "AMMONIA" in the last 168 hours. CBC: Recent Labs  Lab 03/05/22 0240  WBC 9.8  NEUTROABS 8.4*  HGB 9.7*  HCT 30.4*  MCV 79.4*  PLT 67*   Cardiac Enzymes: Recent Labs  Lab 03/05/22 0240  CKTOTAL 97    BNP (last 3 results) No results for input(s): "BNP" in the last 8760 hours.  ProBNP (last 3 results) No results for input(s): "PROBNP" in the last 8760 hours.  CBG: No results for input(s): "GLUCAP" in the last 168 hours.  Radiological Exams on Admission: CT Renal Stone Study  Result Date: 03/05/2022 CLINICAL DATA:  Altered mental  status and generalized weakness for several days. Incontinent of urine. Flank pain. Stones suspected. EXAM: CT ABDOMEN AND PELVIS WITHOUT CONTRAST TECHNIQUE: Multidetector CT imaging of the abdomen and pelvis was performed following the standard protocol without IV contrast. RADIATION DOSE REDUCTION: This exam was performed according to the departmental dose-optimization program which includes automated exposure control, adjustment of the mA and/or kV according to patient size and/or use of iterative reconstruction technique. COMPARISON:  08/31/2021 FINDINGS: Lower chest: No acute abnormality. Hepatobiliary: Calcified granuloma. Unremarkable gallbladder. No definite biliary dilation. Pancreas: Unremarkable. Spleen: Unremarkable. Adrenals/Urinary Tract: Thickened and enlarged left-greater-than-right adrenal glands, unchanged. Tiny nonobstructing calyceal stones versus vascular calcifications bilaterally. No obstructing urinary calculi. No hydronephrosis. Mild diffuse bladder wall thickening. Left bladder diverticulum. Stomach/Bowel: Moderate colonic stool load. Normal caliber large and small bowel. Normal appendix. Colonic diverticulosis without diverticulitis. Vascular/Lymphatic: Aortic  atherosclerosis. Unchanged 4.6 cm infrarenal abdominal aortic aneurysm. No enlarged abdominal or pelvic lymph nodes. Reproductive: Enlarged prostate. Other: No free intraperitoneal air.  No abdominal wall hernia. Musculoskeletal: Body wall anasarca.  No acute fracture. IMPRESSION: 1. Mild bladder wall thickening may be due to obstructive uropathy versus cystitis. 2. Moderate colonic stool load. Correlate for constipation. 3. Unchanged infrarenal abdominal aortic aneurysm measuring 4.6 cm. Recommend follow-up CT/MR every 6 months and vascular consultation. This recommendation follows ACR consensus guidelines: White Paper of the ACR Incidental Findings Committee II on Vascular Findings. J Am Coll Radiol 2013; 10:789-794. 4. Enlarged prostate. Electronically Signed   By: Placido Sou M.D.   On: 03/05/2022 03:49   DG Chest Port 1 View  Result Date: 03/05/2022 CLINICAL DATA:  Weakness EXAM: PORTABLE CHEST 1 VIEW COMPARISON:  08/31/2021 FINDINGS: Lungs are clear.  No pleural effusion or pneumothorax. The heart is normal in size.  Thoracic aortic atherosclerosis. IMPRESSION: No evidence of acute cardiopulmonary disease. Electronically Signed   By: Julian Hy M.D.   On: 03/05/2022 03:49   CT Head Wo Contrast  Result Date: 03/05/2022 CLINICAL DATA:  Delirium EXAM: CT HEAD WITHOUT CONTRAST TECHNIQUE: Contiguous axial images were obtained from the base of the skull through the vertex without intravenous contrast. RADIATION DOSE REDUCTION: This exam was performed according to the departmental dose-optimization program which includes automated exposure control, adjustment of the mA and/or kV according to patient size and/or use of iterative reconstruction technique. COMPARISON:  08/31/2021 FINDINGS: Brain: No evidence of acute infarction, hemorrhage, hydrocephalus, extra-axial collection or mass lesion/mass effect. Extensive subcortical white matter and periventricular small vessel ischemic changes.  Encephalomalacic changes related to an old right parietal infarct. Old left thalamic lacunar infarct. Vascular: Intracranial atherosclerosis. Skull: Normal. Negative for fracture or focal lesion. Sinuses/Orbits: The visualized paranasal sinuses are essentially clear. The mastoid air cells are unopacified. Other: None. IMPRESSION: No evidence of acute intracranial abnormality. 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 Extensive small vessel ischemic changes. Electronically Signed   By: Julian Hy M.D.   On: 03/05/2022 03:48    EKG: I independently viewed the EKG done and my findings are as followed: Normal sinus rhythm at a rate of 91 bpm and LVH with secondary repolarization abnormality  Assessment/Plan Present on Admission:  Altered mental status  Tobacco abuse  Thrombocytopenia (HCC)  Mixed hyperlipidemia  Essential hypertension  Principal Problem:   Altered mental status Active Problems:   Thrombocytopenia ([place])   Essential hypertension   Tobacco abuse   Mixed hyperlipidemia   UTI (urinary tract infection)   Microcytic anemia   Iron deficiency anemia   CKD (chronic  kidney disease) stage 4, GFR 15-29 ml/min (HCC)  Altered mental status in the setting of presumed UTI POA Patient was empirically started on IV ceftriaxone, we shall continue same at this time Prior urine culture on 8/17 was positive for Klebsiella pneumoniae and E. coli and was sensitive to ceftriaxone Urine culture and blood culture pending  Microcytic anemia MCV 79.4, iron studies will be checked  Iron deficiency anemia Continue  FeroSul  CKD stage IV Dehydration BUN/creatinine 47/2.81 (creatinine is within baseline range) Continue gentle hydration Renally adjust medications, avoid nephrotoxic agents/dehydration/hypotension  Chronic thrombocytopenia Platelets 67, no sign of bleeding.  Continue to monitor platelet levels  Essential hypertension (uncontrolled) Continue amlodipine  and Coreg  Mixed hyperlipidemia Continue Lipitor  Tobacco abuse Continue continue   DVT prophylaxis: SCDs  Code Status: Full code  Family Communication: None at bedside  Consults: None  Severity of Illness: The appropriate patient status for this patient is INPATIENT. Inpatient status is judged to be reasonable and necessary in order to provide the required intensity of service to ensure the patient's safety. The patient's presenting symptoms, physical exam findings, and initial radiographic and laboratory data in the context of their chronic comorbidities is felt to place them at high risk for further clinical deterioration. Furthermore, it is not anticipated that the patient will be medically stable for discharge from the hospital within 2 midnights of admission.   * I certify that at the point of admission it is my clinical judgment that the patient will require inpatient hospital care spanning beyond 2 midnights from the point of admission due to high intensity of service, high risk for further deterioration and high frequency of surveillance required.*  Author: Bernadette Hoit, DO 03/05/2022 6:27 AM  For on call review [email or web address].

## 2022-03-05 NOTE — Progress Notes (Signed)
  Transition of Care [facility]) Screening Note   Patient Details  Name: Isaac Hunter Date of Birth: [birth year]   Transition of Care [facility]) CM/SW Contact:    Iona Beard, [place] Phone Number: 03/05/2022, 10:54 AM    Transition of Care Department [facility]) has reviewed patient and no TOC needs have been identified at this time. We will continue to monitor patient advancement through interdisciplinary progression rounds. If new patient transition needs arise, please place a TOC consult.

## 2022-03-05 NOTE — Progress Notes (Signed)
Pt arrived from ED, Alert to self. VS taken, assessment completed and documented. Unable to complete admission questions due to pt altered mental status.

## 2022-03-05 NOTE — Progress Notes (Signed)
Patient seen and evaluated, chart reviewed, please see EMR for updated orders. Please see full H&P dictated by admitting physician Dr. Josephine Cables for same date of service.    Brief Summary:- 82 y.o. male with medical history significant of hypertension, hyperlipidemia, anemia of chronic disease, CKD stage IV, bladder cancer s/p resection (2012), thrombocytopenia, CAD admitted on 03/05/2022 with acute metabolic encephalopathy secondary to presumed UTI   A/p 1) acute metabolic encephalopathy -History of recurrent UTI in the setting of bladder cancer with status post prior bladder resection- -continue Rocephin pending culture data -Add Diflucan for yeast in urine  2) chronic iron deficiency anemia--Hgb at baseline continue iron supplementation  3)CKD stage IV Dehydration BUN/creatinine 47/2.81 (creatinine is within baseline range) Continue gentle hydration Renally adjust medications, avoid nephrotoxic agents/dehydration/hypotension   4)HTN--stable continue carvedilol and amlodipine  5)Tobacco abuse--continue nicotine patch  6) chronic thrombocytopenia--- platelets currently down to 67 K monitor closely, no bleeding noted  - Total care time 57 minutes  Roxan Hockey, MD

## 2022-03-05 NOTE — ED Provider Notes (Signed)
[facility] Provider Note   CSN: [id number] Arrival date & time: 03/05/22  0156     History  Chief Complaint  Patient presents with   Altered Mental Status   Level 5 caveat due to altered mental status Isaac Hunter is a 82 y.o. male.  The history is provided by the patient.  Altered Mental Status Presenting symptoms: confusion   Severity:  Moderate Timing:  Constant Progression:  Worsening Chronicity:  New Patient arrives via EMS.  Patient lives alone.  Is reported patient has had generalized weakness and altered mental status for several days.  Patient was found to be incontinent of urine and was covered in urine and feces upon arrival to the ER.  No signs of trauma.     Home Medications Prior to Admission medications   Medication Sig Start Date End Date Taking? Authorizing Provider  acetaminophen (TYLENOL) 325 MG tablet Take 2 tablets (650 mg total) by mouth every 6 (six) hours as needed for up to 30 doses for mild pain. 12/07/19   Wyvonnia Dusky, MD  amLODipine (NORVASC) 10 MG tablet Take 1 tablet (10 mg total) by mouth daily. For BP 09/03/21 09/03/22  Roxan Hockey, MD  aspirin EC 81 MG tablet Take 1 tablet (81 mg total) by mouth daily with breakfast. 09/03/21   Roxan Hockey, MD  atorvastatin (LIPITOR) 80 MG tablet Take 1 tablet (80 mg total) by mouth daily. 09/12/21   Roxan Hockey, MD  carvedilol (COREG) 6.25 MG tablet Take 1 tablet (6.25 mg total) by mouth 2 (two) times daily with a meal. For BP 09/03/21   Emokpae, Courage, MD  cephALEXin (KEFLEX) 500 MG capsule Take 1 capsule (500 mg total) by mouth 2 (two) times daily. 09/22/21   Orpah Greek, MD  Cholecalciferol 25 MCG (1000 UT) tablet Take 2,000 Units by mouth daily.     [provider]  FEROSUL 325 (65 Fe) MG tablet Take 325 mg by mouth daily with breakfast. 07/07/21   [provider]  losartan-hydrochlorothiazide (HYZAAR) 50-12.5 MG  tablet Take 1 tablet by mouth daily. For BP 09/03/21 09/03/22  Roxan Hockey, MD  Melatonin 3 MG CAPS Take 3 mg by mouth daily as needed (sleep).    [provider]  nicotine (NICODERM CQ - DOSED IN MG/24 HOURS) 21 mg/24hr patch Place 1 patch (21 mg total) onto the skin daily. 09/04/21   Roxan Hockey, MD      Allergies    Patient has no known allergies.    Review of Systems   Review of Systems  Unable to perform ROS: Mental status change  Psychiatric/Behavioral:  Positive for confusion.     Physical Exam Updated Vital Signs BP (!) 147/70   Pulse 89   Temp 99.6 F (37.6 C) (Rectal)   Resp 18   Ht 1.778 m ('5\' 10"'$ )   Wt 63.5 kg   SpO2 92%   BMI 20.09 kg/m  Physical Exam CONSTITUTIONAL: Elderly, disheveled HEAD: Normocephalic/atraumatic EYES: EOMI/PERRL, yellowish discharge noted from left eye, but no proptosis ENMT: Mucous membranes dry NECK: supple no meningeal signs SPINE/BACK:entire spine nontender CV: S1/S2 noted, no murmurs/rubs/gallops noted LUNGS: Lungs are clear to auscultation bilaterally, no apparent distress ABDOMEN: soft, nontender, NEURO: Pt is awake/alert, appears confused.  No focal weakness, moves all extremities without difficulty EXTREMITIES: pulses normal/equal, full ROM, no visible trauma, full ROM of both legs SKIN: warm, color normal   ED Results / Procedures / Treatments  Labs (all labs ordered are listed, but only abnormal results are displayed) Labs Reviewed  COMPREHENSIVE METABOLIC PANEL - Abnormal; Notable for the following components:      Result Value   Glucose, Bld 104 (*)    BUN 47 (*)    Creatinine, Ser 2.81 (*)    GFR, Estimated 22 (*)    All other components within normal limits  CBC WITH DIFFERENTIAL/PLATELET - Abnormal; Notable for the following components:   RBC 3.83 (*)    Hemoglobin 9.7 (*)    HCT 30.4 (*)    MCV 79.4 (*)    MCH 25.3 (*)    RDW 16.0 (*)    Platelets 67 (*)    Neutro Abs 8.4 (*)    Lymphs  Abs 0.4 (*)    All other components within normal limits  URINALYSIS, W/ REFLEX TO CULTURE (INFECTION SUSPECTED) - Abnormal; Notable for the following components:   APPearance HAZY (*)    Hgb urine dipstick LARGE (*)    Protein, ur 100 (*)    Leukocytes,Ua SMALL (*)    All other components within normal limits  CULTURE, BLOOD (ROUTINE X 2)  RESP PANEL BY RT-PCR (RSV, FLU A&B, COVID)  RVPGX2  CULTURE, BLOOD (ROUTINE X 2)  URINE CULTURE  LACTIC ACID, PLASMA  PROTIME-INR  APTT  CK  ETHANOL  RAPID URINE DRUG SCREEN, HOSP PERFORMED    EKG EKG Interpretation  Date/Time:  Sunday March 05 2022 02:22:54 EST Ventricular Rate:  91 PR Interval:  188 QRS Duration: 128 QT Interval:  387 QTC Calculation: 477 R Axis:   24 Text Interpretation: Sinus rhythm Right bundle branch block LVH with IVCD and secondary repol abnrm Borderline prolonged QT interval Baseline wander in lead(s) I aVR Confirmed by Ripley Fraise ([id number]) on 03/05/2022 2:47:59 AM  Radiology CT Renal Stone Study  Result Date: 03/05/2022 CLINICAL DATA:  Altered mental status and generalized weakness for several days. Incontinent of urine. Flank pain. Stones suspected. EXAM: CT ABDOMEN AND PELVIS WITHOUT CONTRAST TECHNIQUE: Multidetector CT imaging of the abdomen and pelvis was performed following the standard protocol without IV contrast. RADIATION DOSE REDUCTION: This exam was performed according to the departmental dose-optimization program which includes automated exposure control, adjustment of the mA and/or kV according to patient size and/or use of iterative reconstruction technique. COMPARISON:  08/31/2021 FINDINGS: Lower chest: No acute abnormality. Hepatobiliary: Calcified granuloma. Unremarkable gallbladder. No definite biliary dilation. Pancreas: Unremarkable. Spleen: Unremarkable. Adrenals/Urinary Tract: Thickened and enlarged left-greater-than-right adrenal glands, unchanged. Tiny nonobstructing calyceal stones versus  vascular calcifications bilaterally. No obstructing urinary calculi. No hydronephrosis. Mild diffuse bladder wall thickening. Left bladder diverticulum. Stomach/Bowel: Moderate colonic stool load. Normal caliber large and small bowel. Normal appendix. Colonic diverticulosis without diverticulitis. Vascular/Lymphatic: Aortic atherosclerosis. Unchanged 4.6 cm infrarenal abdominal aortic aneurysm. No enlarged abdominal or pelvic lymph nodes. Reproductive: Enlarged prostate. Other: No free intraperitoneal air.  No abdominal wall hernia. Musculoskeletal: Body wall anasarca.  No acute fracture. IMPRESSION: 1. Mild bladder wall thickening may be due to obstructive uropathy versus cystitis. 2. Moderate colonic stool load. Correlate for constipation. 3. Unchanged infrarenal abdominal aortic aneurysm measuring 4.6 cm. Recommend follow-up CT/MR every 6 months and vascular consultation. This recommendation follows ACR consensus guidelines: White Paper of the ACR Incidental Findings Committee II on Vascular Findings. J Am Coll Radiol 2013; 10:789-794. 4. Enlarged prostate. Electronically Signed   By: Placido Sou M.D.   On: 03/05/2022 03:49   DG Chest Port 1 View  Result Date: 03/05/2022 CLINICAL  DATA:  Weakness EXAM: PORTABLE CHEST 1 VIEW COMPARISON:  08/31/2021 FINDINGS: Lungs are clear.  No pleural effusion or pneumothorax. The heart is normal in size.  Thoracic aortic atherosclerosis. IMPRESSION: No evidence of acute cardiopulmonary disease. Electronically Signed   By: Julian Hy M.D.   On: 03/05/2022 03:49   CT Head Wo Contrast  Result Date: 03/05/2022 CLINICAL DATA:  Delirium EXAM: CT HEAD WITHOUT CONTRAST TECHNIQUE: Contiguous axial images were obtained from the base of the skull through the vertex without intravenous contrast. RADIATION DOSE REDUCTION: This exam was performed according to the departmental dose-optimization program which includes automated exposure control, adjustment of the mA and/or kV  according to patient size and/or use of iterative reconstruction technique. COMPARISON:  08/31/2021 FINDINGS: Brain: No evidence of acute infarction, hemorrhage, hydrocephalus, extra-axial collection or mass lesion/mass effect. Extensive subcortical white matter and periventricular small vessel ischemic changes. Encephalomalacic changes related to an old right parietal infarct. Old left thalamic lacunar infarct. Vascular: Intracranial atherosclerosis. Skull: Normal. Negative for fracture or focal lesion. Sinuses/Orbits: The visualized paranasal sinuses are essentially clear. The mastoid air cells are unopacified. Other: None. IMPRESSION: No evidence of acute intracranial abnormality. 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 Extensive small vessel ischemic changes. Electronically Signed   By: Julian Hy M.D.   On: 03/05/2022 03:48    Procedures .Critical Care  Performed by: Ripley Fraise, MD Authorized by: Ripley Fraise, MD   Critical care provider statement:    Critical care time (minutes):  45   Critical care start time:  03/05/2022 2:30 AM   Critical care end time:  03/05/2022 3:15 AM   Critical care time was exclusive of:  Separately billable procedures and treating other patients   Critical care was necessary to treat or prevent imminent or life-threatening deterioration of the following conditions:  Sepsis, CNS failure or compromise, dehydration and metabolic crisis   Critical care was time spent personally by me on the following activities:  Examination of patient, development of treatment plan with patient or surrogate, evaluation of patient's response to treatment, ordering and review of laboratory studies, ordering and review of radiographic studies, pulse oximetry, re-evaluation of patient's condition and ordering and performing treatments and interventions   I assumed direction of critical care for this patient from another provider in my specialty: no      Care discussed with: admitting provider       Medications Ordered in ED Medications  sodium chloride 0.9 % bolus 1,000 mL (0 mLs Intravenous Stopped 03/05/22 0404)  cefTRIAXone (ROCEPHIN) 1 g in sodium chloride 0.9 % 100 mL IVPB (1 g Intravenous New Bag/Given 03/05/22 0411)    ED Course/ Medical Decision Making/ A&P Clinical Course as of 03/05/22 0444  Sun Mar 05, 2022  0233 Patient arrives after having 2 to 3 days of altered mental status and weakness.  Patient previous history of sepsis and UTI.  Patient has obvious hematuria on exam.  Will obtain CT head for altered mental status, will also obtain CT renal study to ensure there is no obstructing ureteral stone. [DW]  3016 Patient awake and alert.  No focal signs of stroke.  Still unclear what exactly caused this episode, though he may have a UTI.  Patient was found disheveled and covered in urine and feces at home.  He is able to converse and follow commands, but is unsure why he is in the ER. [DW]  0354 Creatinine(!): 2.81 Renal failure [DW]  0354 Hemoglobin(!): 9.7 Anemia [DW]  [place] not available.  Pt will be admitted for further treatment.  Possible AMS due to UTI.  D/w dr Josephine Cables for admission  [DW]    Clinical Course User Index [DW] Ripley Fraise, MD                             Medical Decision Making Amount and/or Complexity of Data Reviewed Labs: ordered. Decision-making details documented in ED Course. Radiology: ordered. ECG/medicine tests: ordered.  Risk Decision regarding hospitalization.   This patient presents to the ED for concern of altered mental status, this involves an extensive number of treatment options, and is a complaint that carries with it a high risk of complications and morbidity.  The differential diagnosis includes but is not limited to CVA, intracranial hemorrhage, acute coronary syndrome, renal failure, urinary tract infection, electrolyte disturbance, pneumonia    Comorbidities that  complicate the patient evaluation: Patient's presentation is complicated by their history of hypertension  Social Determinants of Health: Patient's  lives alone   increases the complexity of managing their presentation  Additional history obtained:Records reviewed previous admission documents  Lab Tests: I Ordered, and personally interpreted labs.  The pertinent results include:  uti, renal failure  Imaging Studies ordered: I ordered imaging studies including CT scan head and abdomen pelvis and X-ray chest   I independently visualized and interpreted imaging which showed no acute findings, no obstructing stone I agree with the radiologist interpretation  Cardiac Monitoring: The patient was maintained on a cardiac monitor.  I personally viewed and interpreted the cardiac monitor which showed an underlying rhythm of:  sinus rhythm  Medicines ordered and prescription drug management: I ordered medication including IV fluids and rocephin  for dehydration, uti  Reevaluation of the patient after these medicines showed that the patient    stayed the same    Critical Interventions:  IV fluids, antibiotics  Consultations Obtained: I requested consultation with the admitting physician triad , and discussed  findings as well as pertinent plan - they recommend: admit  Reevaluation: After the interventions noted above, I reevaluated the patient and found that they have :stayed the same  Complexity of problems addressed: Patient's presentation is most consistent with  acute presentation with potential threat to life or bodily function  Disposition: After consideration of the diagnostic results and the patient's response to treatment,  I feel that the patent would benefit from admission   .           Final Clinical Impression(s) / ED Diagnoses Final diagnoses:  Delirium  Acute cystitis with hematuria    Rx / DC Orders ED Discharge Orders     None         Ripley Fraise, MD 03/05/22 [phone]

## 2022-03-05 NOTE — ED Triage Notes (Signed)
Pt arrived by EMS from home where he lives alone. Pt family who called EMS states that pt has been altered and has had generalized weakness of several days.   Pt states he is sometime incontinent of urine. Pt is covered in urine and feces on arrival to ED.   Pt is only oriented to self at this time Pt denies having any pain   No signs of trauma noted when rolling and cleaning pt.

## 2022-03-05 NOTE — ED Notes (Signed)
Pt son Edd Arbour states that Isaac Hunter the pt step daughter will be coming in the morning to be with pt and can get updates on plan of care and condition

## 2022-03-06 DIAGNOSIS — D508 Other iron deficiency anemias: Secondary | ICD-10-CM

## 2022-03-06 DIAGNOSIS — R410 Disorientation, unspecified: Secondary | ICD-10-CM | POA: Diagnosis not present

## 2022-03-06 DIAGNOSIS — N3001 Acute cystitis with hematuria: Secondary | ICD-10-CM | POA: Diagnosis not present

## 2022-03-06 DIAGNOSIS — I10 Essential (primary) hypertension: Secondary | ICD-10-CM | POA: Diagnosis not present

## 2022-03-06 LAB — URINALYSIS, ROUTINE W REFLEX MICROSCOPIC
Bacteria, UA: NONE SEEN
Bilirubin Urine: NEGATIVE
Glucose, UA: NEGATIVE mg/dL
Ketones, ur: NEGATIVE mg/dL
Nitrite: NEGATIVE
Protein, ur: >=300 mg/dL — AB
RBC / HPF: >50 RBC/hpf (ref 0–5)
Specific Gravity, Urine: 1.012 (ref 1.005–1.030)
pH: 6 (ref 5.0–8.0)

## 2022-03-06 LAB — COMPREHENSIVE METABOLIC PANEL
ALT: 14 U/L (ref 0–44)
AST: 22 U/L (ref 15–41)
Albumin: 2.8 g/dL — ABNORMAL LOW (ref 3.5–5.0)
Alkaline Phosphatase: 53 U/L (ref 38–126)
Anion gap: 9 (ref 5–15)
BUN: 50 mg/dL — ABNORMAL HIGH (ref 8–23)
CO2: 19 mmol/L — ABNORMAL LOW (ref 22–32)
Calcium: 8.3 mg/dL — ABNORMAL LOW (ref 8.9–10.3)
Chloride: 110 mmol/L (ref 98–111)
Creatinine, Ser: 2.67 mg/dL — ABNORMAL HIGH (ref 0.61–1.24)
GFR, Estimated: 23 mL/min — ABNORMAL LOW (ref >60)
Glucose, Bld: 112 mg/dL — ABNORMAL HIGH (ref 70–99)
Potassium: 4.2 mmol/L (ref 3.5–5.1)
Sodium: 138 mmol/L (ref 135–145)
Total Bilirubin: 0.5 mg/dL (ref 0.3–1.2)
Total Protein: 6 g/dL — ABNORMAL LOW (ref 6.5–8.1)

## 2022-03-06 LAB — GLUCOSE, CAPILLARY: Glucose-Capillary: 148 mg/dL — ABNORMAL HIGH (ref 70–99)

## 2022-03-06 LAB — CBC
HCT: 27.3 % — ABNORMAL LOW (ref 39.0–52.0)
Hemoglobin: 8.9 g/dL — ABNORMAL LOW (ref 13.0–17.0)
MCH: 25.6 pg — ABNORMAL LOW (ref 26.0–34.0)
MCHC: 32.6 g/dL (ref 30.0–36.0)
MCV: 78.7 fL — ABNORMAL LOW (ref 80.0–100.0)
Platelets: 65 10*3/uL — ABNORMAL LOW (ref 150–400)
RBC: 3.47 MIL/uL — ABNORMAL LOW (ref 4.22–5.81)
RDW: 15.9 % — ABNORMAL HIGH (ref 11.5–15.5)
WBC: 6.3 10*3/uL (ref 4.0–10.5)
nRBC: 0 % (ref 0.0–0.2)

## 2022-03-06 MED ORDER — FLUCONAZOLE 100 MG PO TABS
200.0000 mg | ORAL_TABLET | Freq: Every day | ORAL | Status: DC
  Administered 2022-03-06: 200 mg via ORAL
  Filled 2022-03-06: qty 2

## 2022-03-06 MED ORDER — SODIUM CHLORIDE 0.9 % IV SOLN: INTRAVENOUS | Status: AC

## 2022-03-06 MED ORDER — SENNOSIDES-DOCUSATE SODIUM 8.6-50 MG PO TABS
2.0000 | ORAL_TABLET | Freq: Every day | ORAL | Status: DC
  Filled 2022-03-06: qty 2

## 2022-03-06 MED ORDER — POLYETHYLENE GLYCOL 3350 17 G PO PACK
17.0000 g | PACK | Freq: Two times a day (BID) | ORAL | Status: DC
  Administered 2022-03-06 – 2022-03-07 (×2): 17 g via ORAL
  Filled 2022-03-06 (×3): qty 1

## 2022-03-06 MED ORDER — LACTULOSE 10 GM/15ML PO SOLN
30.0000 g | Freq: Once | ORAL | Status: AC
  Administered 2022-03-06: 30 g via ORAL
  Filled 2022-03-06: qty 60

## 2022-03-06 NOTE — Plan of Care (Addendum)
Family provided home/ fast foods that the patient  ate and tolerated well to receive adequate caloric intake. Patient remains pain free. Did not complain of pain or require prn pain medications over night. Patient is able to ambulate to the bathroom with the assistance of a walker. Tech provided standby assistance as needed to ensure patient safety. Bed alarm remains on to ensure tech/nurse is aware of patient getting out of bed.

## 2022-03-06 NOTE — TOC Initial Note (Signed)
Transition of Care [facility]) - Initial/Assessment Note    Patient Details  Name: Isaac Hunter MRN: [id number] Date of Birth: [birth year]  [facility] CM/SW Contact:    Ihor Gully, LCSW Phone Number: 03/06/2022, 4:53 PM  Clinical Narrative:                 Patient from home alone, admitted for AMS. Uses a cane. Has a walker in the home. Does not drive. Maintain medical appointments. Son takes to appointments.   Expected Discharge Plan: Home/Self Care Barriers to Discharge: Continued Medical Work up   Patient Goals and CMS Choice Patient states their goals for this hospitalization and ongoing recovery are:: return home          Expected Discharge Plan and Services       Living arrangements for the past 2 months: Single Family Home                                      Prior Living Arrangements/Services Living arrangements for the past 2 months: Single Family Home Lives with:: Self Patient language and need for interpreter reviewed:: Yes        Need for Family Participation in Patient Care: Yes (Comment) Care giver support system in place?: Yes (comment) Current home services: DME (cane, walker) Criminal Activity/Legal Involvement Pertinent to Current Situation/Hospitalization: No - Comment as needed  Activities of Daily Living Home Assistive Devices/Equipment: Other (Comment) (Cane) ADL Screening (condition at time of admission) Patient's cognitive ability adequate to safely complete daily activities?: No Is the patient deaf or have difficulty hearing?: No Does the patient have difficulty seeing, even when wearing glasses/contacts?: No Does the patient have difficulty concentrating, remembering, or making decisions?: Yes Patient able to express need for assistance with ADLs?: Yes Does the patient have difficulty dressing or bathing?: Yes Independently performs ADLs?: No Communication: Needs assistance Is this a change from baseline?:  Change from baseline, expected to last <3 days Dressing (OT): Needs assistance Is this a change from baseline?: Change from baseline, expected to last <3days Grooming: Needs assistance Is this a change from baseline?: Change from baseline, expected to last <3 days Feeding: Independent Bathing: Needs assistance Is this a change from baseline?: Change from baseline, expected to last <3 days Toileting: Needs assistance Is this a change from baseline?: Change from baseline, expected to last <3 days Walks in Home: Independent with device (comment) Does the patient have difficulty walking or climbing stairs?: Yes Weakness of Legs: Both Weakness of Arms/Hands: None  Permission Sought/Granted                  Emotional Assessment     Affect (typically observed): Appropriate Orientation: : Oriented to Self, Oriented to Place, Oriented to  Time, Oriented to Situation Alcohol / Substance Use: Not Applicable Psych Involvement: No (comment)  Admission diagnosis:  Delirium [R41.0] Altered mental status [R41.82] Acute cystitis with hematuria [N30.01] Patient Active Problem List   Diagnosis Date Noted   Altered mental status 03/05/2022   UTI (urinary tract infection) 03/05/2022   Microcytic anemia 03/05/2022   Iron deficiency anemia 03/05/2022   CKD (chronic kidney disease) stage 4, GFR 15-29 ml/min ([place]) 03/05/2022   Severe sepsis ([place]) 09/01/2021   Hyperkalemia 09/01/2021   Hyperglycemia 09/01/2021   Transaminasemia 09/01/2021   Acute kidney injury superimposed on chronic kidney disease ([place]) 09/01/2021   Mixed hyperlipidemia 09/01/2021  Insomnia 09/01/2021   Gross hematuria 11/17/2010   Anemia due to blood loss 11/17/2010   Thrombocytopenia ([facility]) 11/17/2010   Sinus tachycardia 11/17/2010   Lactic acidosis 11/17/2010   Essential hypertension 11/17/2010   Bladder mass 11/17/2010   DM type 2 (diabetes mellitus, type 2) ([place]) 11/17/2010   Tobacco abuse 11/17/2010   PCP:   [employer], [place]:   [place], [place] - [place] [address] [place] [address] Phone: [phone] Fax: [id number] - [place], [place]. [address] [address] [address] Phone: [phone] Fax: [phone]     Social Determinants of Health (SDOH) Social History: SDOH Screenings   Food Insecurity: No Food Insecurity (03/05/2022)  Housing: Low Risk  (03/05/2022)  Transportation Needs: No Transportation Needs (03/05/2022)  Utilities: Not At Risk (03/05/2022)  Tobacco Use: High Risk (03/05/2022)   SDOH Interventions:     Readmission Risk Interventions    09/02/2021    3:23 PM  Readmission Risk Prevention Plan  Transportation Screening Complete  PCP or Specialist Appt within 5-7 Days Not Complete  Home Care Screening Complete  Medication Review (RN CM) Complete

## 2022-03-06 NOTE — Progress Notes (Signed)
PROGRESS NOTE     Isaac Hunter, is a 82 y.o. male, DOB - 16-Nov-1940, WJX:[id number]  Admit date - 03/05/2022   Admitting Physician Bernadette Hoit, DO  Outpatient Primary MD for the patient is [facility]  LOS - 1  Chief Complaint  Patient presents with   Altered Mental Status        Brief Summary:- 82 y.o. male with medical history significant of hypertension, hyperlipidemia, anemia of chronic disease, CKD stage IV, bladder cancer s/p resection (2012), thrombocytopenia, CAD admitted on 03/05/2022 with acute metabolic encephalopathy secondary to presumed UTI     A/p 1) acute metabolic encephalopathy -As per patient's son Isaac Hunter at bedside patient's mentation is improved but not back to baseline -  2) E. coli UTI and yeast infection--continue Rocephin and Diflucan as ordered -Pending final culture sensitivity data -Patient with hematuria today History of recurrent UTI in the setting of bladder cancer with status post prior bladder resection-   3)CKD stage IV Dehydration BUN/creatinine 47/2.81 (creatinine is within baseline range) Continue gentle hydration Renally adjust medications, avoid nephrotoxic agents/dehydration/hypotension   4)HTN--stable continue carvedilol and amlodipine   5)Tobacco abuse--continue nicotine patch   6) chronic thrombocytopenia--- platelets currently down to 67 K monitor closely, no bleeding noted   7)chronic iron deficiency anemia--Hgb at baseline continue iron supplementation  8)Generalized weakness and deconditioning--- get PT eval  Status is: Inpatient   Disposition: The patient is from: Home              Anticipated d/c is to: Home              Anticipated d/c date is: 1 day              Patient currently is not medically stable to d/c. Barriers: Not Clinically Stable-   Code Status :  -  Code Status: Full Code   Family Communication:   Discussed with son Isaac Hunter at bedsise  DVT Prophylaxis  :   - SCDs  SCDs  Start: 03/05/22 0738   Lab Results  Component Value Date   PLT 65 (L) 03/06/2022    Inpatient Medications  Scheduled Meds:  amLODipine  10 mg Oral Daily   atorvastatin  80 mg Oral Daily   carvedilol  6.25 mg Oral BID WC   ferrous sulfate  325 mg Oral Q breakfast   fluconazole  200 mg Oral QHS   nicotine  21 mg Transdermal Daily   polyethylene glycol  17 g Oral BID   senna-docusate  2 tablet Oral QHS   Continuous Infusions:  cefTRIAXone (ROCEPHIN)  IV 1 g (03/06/22 0103)   PRN Meds:.acetaminophen **OR** acetaminophen, ondansetron **OR** ondansetron (ZOFRAN) IV   Anti-infectives (From admission, onward)    Start     Dose/Rate Route Frequency Ordered Stop   03/06/22 2200  fluconazole (DIFLUCAN) tablet 200 mg        200 mg Oral Daily at bedtime 03/06/22 1538 03/08/22 2159   03/06/22 0100  cefTRIAXone (ROCEPHIN) 1 g in sodium chloride 0.9 % 100 mL IVPB        1 g 200 mL/hr over 30 Minutes Intravenous Every 24 hours 03/05/22 0548     03/05/22 2000  fluconazole (DIFLUCAN) tablet 200 mg  Status:  Discontinued        200 mg Oral Every 48 hours 03/05/22 1807 03/06/22 1538   03/05/22 0345  cefTRIAXone (ROCEPHIN) 1 g in sodium chloride 0.9 % 100 mL IVPB  1 g 200 mL/hr over 30 Minutes Intravenous  Once 03/05/22 0337 03/05/22 0446       Subjective: Isaac Hunter today has no fevers, no emesis,  No chest pain,   - Patient's son Isaac Hunter is at bedside - Patient is still somewhat confused,  patient has weakness requesting PT eval -Patient noted to be having hematuria today   Objective: Vitals:   03/05/22 1227 03/05/22 2053 03/06/22 0459 03/06/22 1523  BP: (!) 155/84 (!) 170/68 (!) 152/72 (!) 152/62  Pulse: 62 80 72 71  Resp:  '20 20 18  '$ Temp:  98.9 F (37.2 C) 98.6 F (37 C) 98 F (36.7 C)  TempSrc:    Oral  SpO2:  98% 100% 98%  Weight:      Height:        Intake/Output Summary (Last 24 hours) at 03/06/2022 1856 Last data filed at 03/06/2022 1700 Gross per 24  hour  Intake 940 ml  Output 600 ml  Net 340 ml   Filed Weights   03/05/22 0227  Weight: 63.5 kg    Physical Exam  Gen:- Awake Alert,  in no apparent distress  HEENT:- [place].AT, No sclera icterus Neck-Supple Neck,No JVD,.  Lungs-  CTAB , fair symmetrical air movement CV- S1, S2 normal, regular  Abd-  +ve B.Sounds, Abd Soft, No tenderness, no CVA area tenderness Extremity/Skin:- No  edema, pedal pulses present  Psych-affect is appropriate, oriented x3 Neuro-no new focal deficits, no tremors  Data Reviewed: I have personally reviewed following labs and imaging studies  CBC: Recent Labs  Lab 03/05/22 0240 03/06/22 0446  WBC 9.8 6.3  NEUTROABS 8.4*  --   HGB 9.7* 8.9*  HCT 30.4* 27.3*  MCV 79.4* 78.7*  PLT 67* 65*   Basic Metabolic Panel: Recent Labs  Lab 03/05/22 0240 03/05/22 0642 03/06/22 0446  NA 139  --  138  K 4.4  --  4.2  CL 108  --  110  CO2 22  --  19*  GLUCOSE 104*  --  112*  BUN 47*  --  50*  CREATININE 2.81*  --  2.67*  CALCIUM 9.0  --  8.3*  MG  --  2.2  --   PHOS  --  3.0  --    GFR: Estimated Creatinine Clearance: 19.2 mL/min (A) (by C-G formula based on SCr of 2.67 mg/dL (H)). Liver Function Tests: Recent Labs  Lab 03/05/22 0240 03/06/22 0446  AST 27 22  ALT 15 14  ALKPHOS 59 53  BILITOT 0.9 0.5  PROT 7.2 6.0*  ALBUMIN 3.7 2.8*   Cardiac Enzymes: Recent Labs  Lab 03/05/22 0240  CKTOTAL 97   Recent Results (from the past 240 hour(s))  Blood Culture (routine x 2)     Status: None (Preliminary result)   Collection Time: 03/05/22  2:38 AM   Specimen: BLOOD RIGHT HAND  Result Value Ref Range Status   Specimen Description BLOOD RIGHT HAND  Final   Special Requests   Final    BOTTLES DRAWN AEROBIC AND ANAEROBIC Blood Culture adequate volume   Culture   Final    NO GROWTH < 24 HOURS Performed at [facility], [address] [place], [place] [address]    Report Status PENDING  Incomplete  Urine Culture     Status: Abnormal  (Preliminary result)   Collection Time: 03/05/22  2:38 AM   Specimen: Urine, Clean Catch  Result Value Ref Range Status   Specimen Description   Final  URINE, CLEAN CATCH Performed at [facility], [place] [address] [place], [place] [address]    Special Requests   Final    NONE Reflexed from [id number] Performed at [facility], [address] [place], [place] [address]    Culture (A)  Final    >=100,000 COLONIES/mL ESCHERICHIA COLI SUSCEPTIBILITIES TO FOLLOW Performed at [facility], [place] [address] [place], [place] [address]    Report Status PENDING  Incomplete  Resp panel by RT-PCR (RSV, Flu A&B, Covid) Anterior Nasal Swab     Status: None   Collection Time: 03/05/22  2:40 AM   Specimen: Anterior Nasal Swab  Result Value Ref Range Status   SARS Coronavirus 2 by RT PCR NEGATIVE NEGATIVE Final    Comment: (NOTE) SARS-CoV-2 target nucleic acids are NOT DETECTED.  The SARS-CoV-2 RNA is generally detectable in upper respiratory specimens during the acute phase of infection. The lowest concentration of SARS-CoV-2 viral copies this assay can detect is 138 copies/mL. A negative result does not preclude SARS-Cov-2 infection and should not be used as the sole basis for treatment or other patient management decisions. A negative result may occur with  improper specimen collection/handling, submission of specimen other than nasopharyngeal swab, presence of viral mutation(s) within the areas targeted by this assay, and inadequate number of viral copies(<138 copies/mL). A negative result must be combined with clinical observations, patient history, and epidemiological information. The expected result is Negative.  Fact Sheet for Patients:  EntrepreneurPulse.com.au  Fact Sheet for Healthcare Providers:  IncredibleEmployment.be  This test is no t yet approved or cleared by the [place] FDA and  has been authorized for detection and/or  diagnosis of SARS-CoV-2 by FDA under an Emergency Use Authorization (EUA). This EUA will remain  in effect (meaning this test can be used) for the duration of the COVID-19 declaration under Section 564(b)(1) of the Act, 21 U.S.C.section 360bbb-3(b)(1), unless the authorization is terminated  or revoked sooner.       Influenza A by PCR NEGATIVE NEGATIVE Final   Influenza B by PCR NEGATIVE NEGATIVE Final    Comment: (NOTE) The Xpert Xpress SARS-CoV-2/FLU/RSV plus assay is intended as an aid in the diagnosis of influenza from Nasopharyngeal swab specimens and should not be used as a sole basis for treatment. Nasal washings and aspirates are unacceptable for Xpert Xpress SARS-CoV-2/FLU/RSV testing.  Fact Sheet for Patients: EntrepreneurPulse.com.au  Fact Sheet for Healthcare Providers: IncredibleEmployment.be  This test is not yet approved or cleared by the [place] FDA and has been authorized for detection and/or diagnosis of SARS-CoV-2 by FDA under an Emergency Use Authorization (EUA). This EUA will remain in effect (meaning this test can be used) for the duration of the COVID-19 declaration under Section 564(b)(1) of the Act, 21 U.S.C. section 360bbb-3(b)(1), unless the authorization is terminated or revoked.     Resp Syncytial Virus by PCR NEGATIVE NEGATIVE Final    Comment: (NOTE) Fact Sheet for Patients: EntrepreneurPulse.com.au  Fact Sheet for Healthcare Providers: IncredibleEmployment.be  This test is not yet approved or cleared by the [place] FDA and has been authorized for detection and/or diagnosis of SARS-CoV-2 by FDA under an Emergency Use Authorization (EUA). This EUA will remain in effect (meaning this test can be used) for the duration of the COVID-19 declaration under Section 564(b)(1) of the Act, 21 U.S.C. section 360bbb-3(b)(1), unless the authorization is terminated  or revoked.  Performed at [facility], [address], [place],  [address]  Blood Culture (routine x 2)     Status: None (Preliminary result)   Collection Time: 03/05/22  2:59 AM   Specimen: Site Not Specified; Blood  Result Value Ref Range Status   Specimen Description   Final    SITE NOT SPECIFIED BOTTLES DRAWN AEROBIC AND ANAEROBIC   Special Requests Blood Culture adequate volume  Final   Culture   Final    NO GROWTH < 24 HOURS Performed at [facility], [address], [place], [place] [address]    Report Status PENDING  Incomplete      Radiology Studies: CT Renal Stone Study  Result Date: 03/05/2022 CLINICAL DATA:  Altered mental status and generalized weakness for several days. Incontinent of urine. Flank pain. Stones suspected. EXAM: CT ABDOMEN AND PELVIS WITHOUT CONTRAST TECHNIQUE: Multidetector CT imaging of the abdomen and pelvis was performed following the standard protocol without IV contrast. RADIATION DOSE REDUCTION: This exam was performed according to the departmental dose-optimization program which includes automated exposure control, adjustment of the mA and/or kV according to patient size and/or use of iterative reconstruction technique. COMPARISON:  08/31/2021 FINDINGS: Lower chest: No acute abnormality. Hepatobiliary: Calcified granuloma. Unremarkable gallbladder. No definite biliary dilation. Pancreas: Unremarkable. Spleen: Unremarkable. Adrenals/Urinary Tract: Thickened and enlarged left-greater-than-right adrenal glands, unchanged. Tiny nonobstructing calyceal stones versus vascular calcifications bilaterally. No obstructing urinary calculi. No hydronephrosis. Mild diffuse bladder wall thickening. Left bladder diverticulum. Stomach/Bowel: Moderate colonic stool load. Normal caliber large and small bowel. Normal appendix. Colonic diverticulosis without diverticulitis. Vascular/Lymphatic: Aortic atherosclerosis. Unchanged 4.6 cm infrarenal abdominal aortic  aneurysm. No enlarged abdominal or pelvic lymph nodes. Reproductive: Enlarged prostate. Other: No free intraperitoneal air.  No abdominal wall hernia. Musculoskeletal: Body wall anasarca.  No acute fracture. IMPRESSION: 1. Mild bladder wall thickening may be due to obstructive uropathy versus cystitis. 2. Moderate colonic stool load. Correlate for constipation. 3. Unchanged infrarenal abdominal aortic aneurysm measuring 4.6 cm. Recommend follow-up CT/MR every 6 months and vascular consultation. This recommendation follows ACR consensus guidelines: White Paper of the ACR Incidental Findings Committee II on Vascular Findings. J Am Coll Radiol 2013; 10:789-794. 4. Enlarged prostate. Electronically Signed   By: Placido Sou M.D.   On: 03/05/2022 03:49   DG Chest Port 1 View  Result Date: 03/05/2022 CLINICAL DATA:  Weakness EXAM: PORTABLE CHEST 1 VIEW COMPARISON:  08/31/2021 FINDINGS: Lungs are clear.  No pleural effusion or pneumothorax. The heart is normal in size.  Thoracic aortic atherosclerosis. IMPRESSION: No evidence of acute cardiopulmonary disease. Electronically Signed   By: Julian Hy M.D.   On: 03/05/2022 03:49   CT Head Wo Contrast  Result Date: 03/05/2022 CLINICAL DATA:  Delirium EXAM: CT HEAD WITHOUT CONTRAST TECHNIQUE: Contiguous axial images were obtained from the base of the skull through the vertex without intravenous contrast. RADIATION DOSE REDUCTION: This exam was performed according to the departmental dose-optimization program which includes automated exposure control, adjustment of the mA and/or kV according to patient size and/or use of iterative reconstruction technique. COMPARISON:  08/31/2021 FINDINGS: Brain: No evidence of acute infarction, hemorrhage, hydrocephalus, extra-axial collection or mass lesion/mass effect. Extensive subcortical white matter and periventricular small vessel ischemic changes. Encephalomalacic changes related to an old right parietal infarct. Old  left thalamic lacunar infarct. Vascular: Intracranial atherosclerosis. Skull: Normal. Negative for fracture or focal lesion. Sinuses/Orbits: The visualized paranasal sinuses are essentially clear. The mastoid air cells are unopacified. Other: None. IMPRESSION: No evidence of acute intracranial abnormality. 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 Extensive  small vessel ischemic changes. Electronically Signed   By: Julian Hy M.D.   On: 03/05/2022 03:48     Scheduled Meds:  amLODipine  10 mg Oral Daily   atorvastatin  80 mg Oral Daily   carvedilol  6.25 mg Oral BID WC   ferrous sulfate  325 mg Oral Q breakfast   fluconazole  200 mg Oral QHS   nicotine  21 mg Transdermal Daily   polyethylene glycol  17 g Oral BID   senna-docusate  2 tablet Oral QHS   Continuous Infusions:  cefTRIAXone (ROCEPHIN)  IV 1 g (03/06/22 0103)     LOS: 1 day    Roxan Hockey M.D on 03/06/2022 at 6:56 PM  Go to [email or web address] - for contact info  [facility] - Office  [phone]  If 7PM-7AM, please contact night-coverage [email or web address] 03/06/2022, 6:56 PM

## 2022-03-06 NOTE — Progress Notes (Signed)
PHARMACY NOTE:  ANTIMICROBIAL RENAL DOSAGE ADJUSTMENT  Current antimicrobial regimen includes a mismatch between antimicrobial dosage and estimated renal function.  As per policy approved by the Pharmacy & Therapeutics and Medical Executive Committees, the antimicrobial dosage will be adjusted accordingly.  Current antimicrobial dosage:  fluconazole '200mg'$  q48 hours x 3 doses  Indication: yeast in urine  Renal Function:  Estimated Creatinine Clearance: 19.2 mL/min (A) (by C-G formula based on SCr of 2.67 mg/dL (H)).  Antimicrobial dosage has been changed to:  Fluconazole '200mg'$  qd for a total of 3 doses.    Thank you for allowing pharmacy to be a part of this patient's care.  Erin Hearing PharmD., BCPS Clinical Pharmacist 03/06/2022 3:38 PM

## 2022-03-07 DIAGNOSIS — D696 Thrombocytopenia, unspecified: Secondary | ICD-10-CM | POA: Diagnosis not present

## 2022-03-07 DIAGNOSIS — R410 Disorientation, unspecified: Secondary | ICD-10-CM | POA: Diagnosis not present

## 2022-03-07 DIAGNOSIS — N184 Chronic kidney disease, stage 4 (severe): Secondary | ICD-10-CM | POA: Diagnosis not present

## 2022-03-07 DIAGNOSIS — D508 Other iron deficiency anemias: Secondary | ICD-10-CM | POA: Diagnosis not present

## 2022-03-07 LAB — URINE CULTURE: Culture: >=100000 — AB

## 2022-03-07 MED ORDER — FOSFOMYCIN TROMETHAMINE 3 G PO PACK
3.0000 g | PACK | Freq: Once | ORAL | Status: AC
  Administered 2022-03-07: 3 g via ORAL
  Filled 2022-03-07: qty 3

## 2022-03-07 MED ORDER — FOSFOMYCIN TROMETHAMINE 3 G PO PACK: 3.0000 g | PACK | Freq: Once | ORAL | 0 refills | Status: AC

## 2022-03-07 MED ORDER — SENNOSIDES-DOCUSATE SODIUM 8.6-50 MG PO TABS: 2.0000 | ORAL_TABLET | Freq: Every day | ORAL | 1 refills | Status: DC

## 2022-03-07 MED ORDER — FUROSEMIDE 20 MG PO TABS: 20.0000 mg | ORAL_TABLET | Freq: Every morning | ORAL | 1 refills | Status: DC

## 2022-03-07 MED ORDER — FEROSUL 325 (65 FE) MG PO TABS: 325.0000 mg | ORAL_TABLET | Freq: Every day | ORAL | 3 refills | Status: DC

## 2022-03-07 MED ORDER — AMLODIPINE BESYLATE 10 MG PO TABS: 10.0000 mg | ORAL_TABLET | Freq: Every day | ORAL | 3 refills | Status: AC

## 2022-03-07 MED ORDER — CARVEDILOL 6.25 MG PO TABS: 6.2500 mg | ORAL_TABLET | Freq: Two times a day (BID) | ORAL | 3 refills | Status: DC

## 2022-03-07 MED ORDER — ASPIRIN 81 MG PO TBEC: 81.0000 mg | DELAYED_RELEASE_TABLET | Freq: Every day | ORAL | 12 refills | Status: DC

## 2022-03-07 MED ORDER — FLUCONAZOLE 200 MG PO TABS: 200.0000 mg | ORAL_TABLET | Freq: Every day | ORAL | 0 refills | Status: AC

## 2022-03-07 NOTE — Care Management Important Message (Signed)
Important Message  Patient Details  Name: Isaac Hunter MRN: [id number] Date of Birth: Jun 23, 1940   Medicare Important Message Given:  N/A - LOS <3 / Initial given by admissions     Tommy Medal 03/07/2022, 10:11 AM

## 2022-03-07 NOTE — Discharge Summary (Signed)
Isaac Hunter, is a 82 y.o. male  DOB Sep 24, 1940  MRN [id number].  Admission date:  03/05/2022  Admitting Physician  Bernadette Hoit, DO  Discharge Date:  03/07/2022   Primary [place], [facility]  Recommendations for primary care physician for things to follow:   1)Please follow-up with Urologist Dr. Irine Seal on Thursday 03/09/22 at 1115 am--- for recheck and follow-up evaluation  in his office----[facility], [address] [place] [address], [place] [address] Phone Number----[phone]  2)Repeat CBC and BMP Blood Tests in 5 to 7 days advised  3)Take Fosfomycin 3 gm one time on Friday 03/10/22  NEW PATIENT with Irine Seal, MD Thursday Mar 09, 2022 11:15 AM [facility] [place] [address] [phone]  Admission Diagnosis  Delirium [R41.0] Altered mental status [R41.82] Acute cystitis with hematuria [N30.01]   Discharge Diagnosis  Delirium [R41.0] Altered mental status [R41.82] Acute cystitis with hematuria [N30.01]    Principal Problem:   Altered mental status Active Problems:   Thrombocytopenia ([place])   Essential hypertension   Tobacco abuse   Mixed hyperlipidemia   UTI (urinary tract infection)   Microcytic anemia   Iron deficiency anemia   CKD (chronic kidney disease) stage 4, GFR 15-29 ml/min (HCC)      Past Medical History:  Diagnosis Date   CAD (coronary artery disease)    Non obstructive per cath 2001.   Diabetes mellitus    hx per pt, no longer has?   Hypertension    hx per pt, no long has??   Tobacco abuse 11/17/2010    Past Surgical History:  Procedure Laterality Date   CYSTOSCOPY  11/21/2010   Procedure: CYSTOSCOPY;  Surgeon: Marissa Nestle;  Location: [facility];  Service: Urology;  Laterality: N/A;   Left hand surgery     TRANSURETHRAL RESECTION OF BLADDER TUMOR  11/21/2010    Procedure: TRANSURETHRAL RESECTION OF BLADDER TUMOR (TURBT);  Surgeon: Marissa Nestle;  Location: [facility];  Service: Urology;  Laterality: N/A;  evacuation of clots       HPI  from the history and physical done on the day of admission:   HPI: Isaac Hunter is a 82 y.o. male with medical history significant of hypertension, hyperlipidemia, anemia of chronic disease, CKD stage IV, bladder cancer s/p resection (2012), thrombocytopenia, CAD who presents to the emergency department from home via EMS due to altered mental status.  Patient was unable to provide history due to altered mental status, history was obtained from ED physician and ED medical record.  Per report, patient presents with generalized weakness and altered mental status lethargy and ongoing for several days, he lives alone and ambulates with a cane at baseline.  Family activated EMS, on arrival of EMS team, patient was found to be incontinent of urine and was covered in urine and feces on arrival to the ED.   ED Course:  In the emergency department, BP was 166/69 on arrival to the ED  and other vital signs were within normal range.  Workup in the ED showed WBC of 9.8, microcytic anemia, thrombocytopenia, BMP was normal except for blood glucose of 104 and BUN/creatinine 47/2.81.  Lactic acid 1.6, alcohol level was less than 10, total CK was 97, urine drug screen was normal, urinalysis was positive for hematuria and small leukocytes.  Influenza A, B, SARS coronavirus 2, RSV was negative, blood culture pending CT abdomen and pelvis without contrast showed mild bladder wall thickening which may be due to obstructive uropathy versus cystitis Chest x-ray showed no evidence of acute cardiopulmonary disease CT head without contrast showed no evidence of acute intracranial abnormality Patient was treated with IV ceftriaxone, IV hydration of 1 L NS was provided.  Hospitalist was asked to admit patient for further evaluation and management.    Review of Systems: Review of systems as noted in the HPI. All other systems reviewed and are negative.     Hospital Course:   Brief Summary:- 82 y.o. male with medical history significant of hypertension, hyperlipidemia, anemia of chronic disease, CKD stage IV, bladder cancer s/p resection (2012), thrombocytopenia, CAD admitted on 03/05/2022 with acute metabolic encephalopathy secondary to presumed UTI     A/p 1) acute metabolic encephalopathy -As per patient's son Edd Arbour at bedside patient's mentation has improved  -At baseline according to son patient is at times forgetful and disoriented due to underlying dementia and cognitive deficits even on a good day  2)ESBL E. coli UTI and yeast infection--with hematuria -initially treated with IV Rocephin -Patient received fosfomycin 3 g x 1 on 03/07/2022 -He is to have repeat fosfomycin 3 g x 1 on Friday, 03/10/2022 -He is to follow-up with urologist Dr. Irine Seal in [place] on Thursday, 03/09/2022 at 11:15 AM -Okay to complete Diflucan -Persistent hematuria in the setting of history of recurrent UTI in the setting of bladder cancer with status post prior bladder resection and ESBL E. coli UTI--- urology follow-up with Dr. Jeffie Pollock as above -I reached out to the urology team Dr. Diona Fanti and Dr. Alyson Ingles to discuss patient's urological status and ongoing problems urologically-- and they advised outpatient urology appointment which was given to patient by [facility] in [place] with Dr. Jeffie Pollock for Thursday, 03/09/2022 at 11:15 AM -Patient is voiding well at this time despite persistent hematuria no need for Foley insertion at this time especially given active ESBL E. coli UTI infection -Await urology evaluation as above   3)CKD stage IV BUN/creatinine 47/2.81 (creatinine is within baseline range) -Creatinine remains stable repeat BMP in 5 to 7 days advised [place] adjust medications, avoid nephrotoxic agents/dehydration/hypotension    4)HTN--stable, continue Carvedilol and Amlodipine   5)Tobacco abuse--continue nicotine patch   6) chronic thrombocytopenia--- platelets currently down to 65 K  -Persistent hematuria noted -Outpatient follow-up urology as above -Repeat CBC in 5 to 7 days advised  7)chronic iron deficiency anemia--Hgb at baseline continue iron supplementation -Repeat CBC in 5 to 7 days advised   8)Generalized weakness and deconditioning--- PT recommends home health physical therapy which has been ordered for patient    Disposition: The patient is from: Home              Anticipated d/c is to: Home with home health PT  Discharge Condition: stable  Follow UP   Follow-up Information     [facility] Follow [facility].   Specialty: Home Health Services Why: Will contact you to schedule home health visits.        Jeffie Pollock,  Jenny Reichmann, MD. Daphane Shepherd on 03/09/2022.   Specialty: Urology Why: at 1115 am Contact information: [facility] [address] [phone]                  Consults obtained -Discussed with Urologist  Diet and Activity recommendation:  As advised  Discharge Instructions    Discharge Instructions     Call MD for:  difficulty breathing, headache or visual disturbances   Complete by: As directed    Call MD for:  persistant dizziness or light-headedness   Complete by: As directed    Call MD for:  persistant nausea and vomiting   Complete by: As directed    Call MD for:  temperature >100.4   Complete by: As directed    Diet - low sodium heart healthy   Complete by: As directed    Discharge instructions   Complete by: As directed    1)Please follow-up with Urologist Dr. Irine Seal on Thursday 03/09/22 at 1115 am--- for recheck and follow-up evaluation  in his office----[facility] [place], [address] [place] [address], [place] [address] Phone Number----[phone]  2)Repeat CBC and BMP Blood Tests in 5 to 7 days advised  3)Take  Fosfomycin 3 gm one time on Friday 03/10/22  NEW PATIENT with Irine Seal, MD Thursday Mar 09, 2022 11:15 AM [facility] [place] [address] [phone]   Increase activity slowly   Complete by: As directed          Discharge Medications     Allergies as of 03/07/2022   No Known Allergies      Medication List     STOP taking these medications    losartan-hydrochlorothiazide 50-12.5 MG tablet Commonly known as: Hyzaar   nicotine 21 mg/24hr patch Commonly known as: NICODERM CQ - dosed in mg/24 hours       TAKE these medications    acetaminophen 325 MG tablet Commonly known as: Tylenol Take 2 tablets (650 mg total) by mouth every 6 (six) hours as needed for up to 30 doses for mild pain.   amLODipine 10 MG tablet Commonly known as: NORVASC Take 1 tablet (10 mg total) by mouth daily. For BP   aspirin EC 81 MG tablet Take 1 tablet (81 mg total) by mouth daily with breakfast.   atorvastatin 80 MG tablet Commonly known as: LIPITOR Take 1 tablet (80 mg total) by mouth daily.   carvedilol 6.25 MG tablet Commonly known as: Coreg Take 1 tablet (6.25 mg total) by mouth 2 (two) times daily with a meal. For BP   Cholecalciferol 25 MCG (1000 UT) tablet Take 2,000 Units by mouth daily.   FeroSul 325 (65 FE) MG tablet Generic drug: ferrous sulfate Take 1 tablet (325 mg total) by mouth daily with breakfast.   fluconazole 200 MG tablet Commonly known as: DIFLUCAN Take 1 tablet (200 mg total) by mouth at bedtime for 2 days.   fosfomycin 3 g Pack Commonly known as: MONUROL Take 3 g by mouth once for 1 dose. For UTI Start taking on: March 10, 2022   furosemide 20 MG tablet Commonly known as: LASIX Take 1 tablet (20 mg total) by mouth every morning.   senna-docusate 8.6-50 MG tablet Commonly known as: Senokot-S Take 2 tablets by mouth at bedtime.        Major procedures and Radiology Reports - PLEASE review  detailed and final reports for all details, in brief -  CT Renal Stone Study  Result Date: 03/05/2022 CLINICAL DATA:  Altered mental status and generalized weakness for several days. Incontinent of urine. Flank pain. Stones suspected. EXAM: CT ABDOMEN AND PELVIS WITHOUT CONTRAST TECHNIQUE: Multidetector CT imaging of the abdomen and pelvis was performed following the standard protocol without IV contrast. RADIATION DOSE REDUCTION: This exam was performed according to the departmental dose-optimization program which includes automated exposure control, adjustment of the mA and/or kV according to patient size and/or use of iterative reconstruction technique. COMPARISON:  08/31/2021 FINDINGS: Lower chest: No acute abnormality. Hepatobiliary: Calcified granuloma. Unremarkable gallbladder. No definite biliary dilation. Pancreas: Unremarkable. Spleen: Unremarkable. Adrenals/Urinary Tract: Thickened and enlarged left-greater-than-right adrenal glands, unchanged. Tiny nonobstructing calyceal stones versus vascular calcifications bilaterally. No obstructing urinary calculi. No hydronephrosis. Mild diffuse bladder wall thickening. Left bladder diverticulum. Stomach/Bowel: Moderate colonic stool load. Normal caliber large and small bowel. Normal appendix. Colonic diverticulosis without diverticulitis. Vascular/Lymphatic: Aortic atherosclerosis. Unchanged 4.6 cm infrarenal abdominal aortic aneurysm. No enlarged abdominal or pelvic lymph nodes. Reproductive: Enlarged prostate. Other: No free intraperitoneal air.  No abdominal wall hernia. Musculoskeletal: Body wall anasarca.  No acute fracture. IMPRESSION: 1. Mild bladder wall thickening may be due to obstructive uropathy versus cystitis. 2. Moderate colonic stool load. Correlate for constipation. 3. Unchanged infrarenal abdominal aortic aneurysm measuring 4.6 cm. Recommend follow-up CT/MR every 6 months and vascular consultation. This recommendation follows ACR consensus  guidelines: White Paper of the ACR Incidental Findings Committee II on Vascular Findings. J Am Coll Radiol 2013; 10:789-794. 4. Enlarged prostate. Electronically Signed   By: Placido Sou M.D.   On: 03/05/2022 03:49   DG Chest Port 1 View  Result Date: 03/05/2022 CLINICAL DATA:  Weakness EXAM: PORTABLE CHEST 1 VIEW COMPARISON:  08/31/2021 FINDINGS: Lungs are clear.  No pleural effusion or pneumothorax. The heart is normal in size.  Thoracic aortic atherosclerosis. IMPRESSION: No evidence of acute cardiopulmonary disease. Electronically Signed   By: Julian Hy M.D.   On: 03/05/2022 03:49   CT Head Wo Contrast  Result Date: 03/05/2022 CLINICAL DATA:  Delirium EXAM: CT HEAD WITHOUT CONTRAST TECHNIQUE: Contiguous axial images were obtained from the base of the skull through the vertex without intravenous contrast. RADIATION DOSE REDUCTION: This exam was performed according to the departmental dose-optimization program which includes automated exposure control, adjustment of the mA and/or kV according to patient size and/or use of iterative reconstruction technique. COMPARISON:  08/31/2021 FINDINGS: Brain: No evidence of acute infarction, hemorrhage, hydrocephalus, extra-axial collection or mass lesion/mass effect. Extensive subcortical white matter and periventricular small vessel ischemic changes. Encephalomalacic changes related to an old right parietal infarct. Old left thalamic lacunar infarct. Vascular: Intracranial atherosclerosis. Skull: Normal. Negative for fracture or focal lesion. Sinuses/Orbits: The visualized paranasal sinuses are essentially clear. The mastoid air cells are unopacified. Other: None. IMPRESSION: No evidence of acute intracranial abnormality. 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 Extensive small vessel ischemic changes. Electronically Signed   By: Julian Hy M.D.   On: 03/05/2022 03:48    Micro Results   Recent Results (from the past 240  hour(s))  Blood Culture (routine x 2)     Status: None (Preliminary result)   Collection Time: 03/05/22  2:38 AM   Specimen: BLOOD RIGHT HAND  Result Value Ref Range Status   Specimen Description BLOOD RIGHT HAND  Final   Special Requests   Final    BOTTLES DRAWN AEROBIC AND ANAEROBIC Blood Culture adequate volume   Culture   Final  NO GROWTH < 24 HOURS Performed at [facility], [address] [place], [place] [address]    Report Status PENDING  Incomplete  Urine Culture     Status: Abnormal   Collection Time: 03/05/22  2:38 AM   Specimen: Urine, Clean Catch  Result Value Ref Range Status   Specimen Description   Final    URINE, CLEAN CATCH Performed at [facility], [place] [address] [place], [place] [address]    Special Requests   Final    NONE Reflexed from [id number] Performed at [facility], [address], [place], [place] [address]    Culture (A)  Final    >=100,000 COLONIES/mL ESCHERICHIA COLI Confirmed Extended Spectrum Beta-Lactamase Producer (ESBL).  In bloodstream infections from ESBL organisms, carbapenems are preferred over piperacillin/tazobactam. They are shown to have a lower risk of mortality.    Report Status 03/07/2022 FINAL  Final   Organism ID, Bacteria ESCHERICHIA COLI (A)  Final      Susceptibility   Escherichia coli - MIC*    AMPICILLIN >=32 RESISTANT Resistant     CEFAZOLIN >=64 RESISTANT Resistant     CEFEPIME >=32 RESISTANT Resistant     CEFTRIAXONE >=64 RESISTANT Resistant     CIPROFLOXACIN >=4 RESISTANT Resistant     GENTAMICIN >=16 RESISTANT Resistant     IMIPENEM <=0.25 SENSITIVE Sensitive     NITROFURANTOIN 64 INTERMEDIATE Intermediate     TRIMETH/SULFA <=20 SENSITIVE Sensitive     AMPICILLIN/SULBACTAM >=32 RESISTANT Resistant     PIP/TAZO >=128 RESISTANT Resistant     * >=100,000 COLONIES/mL ESCHERICHIA COLI  Resp panel by RT-PCR (RSV, Flu A&B, Covid) Anterior Nasal Swab     Status: None   Collection Time: 03/05/22  2:40 AM    Specimen: Anterior Nasal Swab  Result Value Ref Range Status   SARS Coronavirus 2 by RT PCR NEGATIVE NEGATIVE Final    Comment: (NOTE) SARS-CoV-2 target nucleic acids are NOT DETECTED.  The SARS-CoV-2 RNA is generally detectable in upper respiratory specimens during the acute phase of infection. The lowest concentration of SARS-CoV-2 viral copies this assay can detect is 138 copies/mL. A negative result does not preclude SARS-Cov-2 infection and should not be used as the sole basis for treatment or other patient management decisions. A negative result may occur with  improper specimen collection/handling, submission of specimen other than nasopharyngeal swab, presence of viral mutation(s) within the areas targeted by this assay, and inadequate number of viral copies(<138 copies/mL). A negative result must be combined with clinical observations, patient history, and epidemiological information. The expected result is Negative.  Fact Sheet for Patients:  EntrepreneurPulse.com.au  Fact Sheet for Healthcare Providers:  IncredibleEmployment.be  This test is no t yet approved or cleared by the [place] FDA and  has been authorized for detection and/or diagnosis of SARS-CoV-2 by FDA under an Emergency Use Authorization (EUA). This EUA will remain  in effect (meaning this test can be used) for the duration of the COVID-19 declaration under Section 564(b)(1) of the Act, 21 U.S.C.section 360bbb-3(b)(1), unless the authorization is terminated  or revoked sooner.       Influenza A by PCR NEGATIVE NEGATIVE Final   Influenza B by PCR NEGATIVE NEGATIVE Final    Comment: (NOTE) The Xpert Xpress SARS-CoV-2/FLU/RSV plus assay is intended as an aid in the diagnosis of influenza from Nasopharyngeal swab specimens and should not be used as a sole basis for treatment. Nasal washings and aspirates are unacceptable for Xpert Xpress  SARS-CoV-2/FLU/RSV testing.  Fact Sheet for Patients: EntrepreneurPulse.com.au  Fact Sheet for Healthcare Providers: IncredibleEmployment.be  This test is not yet approved or cleared by the [place] FDA and has been authorized for detection and/or diagnosis of SARS-CoV-2 by FDA under an Emergency Use Authorization (EUA). This EUA will remain in effect (meaning this test can be used) for the duration of the COVID-19 declaration under Section 564(b)(1) of the Act, 21 U.S.C. section 360bbb-3(b)(1), unless the authorization is terminated or revoked.     Resp Syncytial Virus by PCR NEGATIVE NEGATIVE Final    Comment: (NOTE) Fact Sheet for Patients: EntrepreneurPulse.com.au  Fact Sheet for Healthcare Providers: IncredibleEmployment.be  This test is not yet approved or cleared by the [place] FDA and has been authorized for detection and/or diagnosis of SARS-CoV-2 by FDA under an Emergency Use Authorization (EUA). This EUA will remain in effect (meaning this test can be used) for the duration of the COVID-19 declaration under Section 564(b)(1) of the Act, 21 U.S.C. section 360bbb-3(b)(1), unless the authorization is terminated or revoked.  Performed at [facility], [address] [place], [place] [address]   Blood Culture (routine x 2)     Status: None (Preliminary result)   Collection Time: 03/05/22  2:59 AM   Specimen: Site Not Specified; Blood  Result Value Ref Range Status   Specimen Description   Final    SITE NOT SPECIFIED BOTTLES DRAWN AEROBIC AND ANAEROBIC   Special Requests Blood Culture adequate volume  Final   Culture   Final    NO GROWTH < 24 HOURS Performed at [facility], [address] [place], [place] [address]    Report Status PENDING  Incomplete    Today   Subjective    Matin Mattioli today has no new complaints - Called and updated patient's son, questions  answered No fever  Or chills   No Nausea, Vomiting or Diarrhea No dysuria -Condom catheter and Foley bag with blood-tinged urine         Patient has been seen and examined prior to discharge   Objective   Blood pressure 133/62, pulse 62, temperature 97.8 F (36.6 C), temperature source Oral, resp. rate 16, height '5\' 10"'$  (1.778 m), weight 63.5 kg, SpO2 100 %.   Intake/Output Summary (Last 24 hours) at 03/07/2022 1835 Last data filed at 03/07/2022 0453 Gross per 24 hour  Intake 1071.81 ml  Output --  Net 1071.81 ml    Exam Gen:- Awake Alert, no acute distress  HEENT:- [place].AT, No sclera icterus Neck-Supple Neck,No JVD,.  Lungs-  CTAB , good air movement bilaterally CV- S1, S2 normal, regular Abd-  +ve B.Sounds, Abd Soft, No tenderness, no CVA area tenderness Extremity/Skin:- No  edema,   good pulses Psych-affect is appropriate, oriented x3, at baseline patient can be forgetful with occasional cognitive deficits Neuro-generalized weakness, no new focal deficits, no tremors    Data Review   CBC w Diff:  Lab Results  Component Value Date   WBC 6.3 03/06/2022   HGB 8.9 (L) 03/06/2022   HCT 27.3 (L) 03/06/2022   PLT 65 (L) 03/06/2022   LYMPHOPCT 4 03/05/2022   MONOPCT 9 03/05/2022   EOSPCT 0 03/05/2022   BASOPCT 0 03/05/2022    CMP:  Lab Results  Component Value Date   NA 138 03/06/2022   K 4.2 03/06/2022   CL 110 03/06/2022   CO2 19 (L) 03/06/2022   BUN 50 (H) 03/06/2022   CREATININE 2.67 (H) 03/06/2022   PROT  6.0 (L) 03/06/2022   ALBUMIN 2.8 (L) 03/06/2022   BILITOT 0.5 03/06/2022   ALKPHOS 53 03/06/2022   AST 22 03/06/2022   ALT 14 03/06/2022  .  Total Discharge time is about 33 minutes  Roxan Hockey M.D on 03/07/2022 at 6:35 PM  Go to [email or web address] -  for contact info  [facility] - Office  [phone]

## 2022-03-07 NOTE — Discharge Instructions (Addendum)
1)Please follow-up with Urologist Dr. Irine Seal on Thursday 03/09/22 at 1115 am-- for recheck and follow-up evaluation  in his office----[facility] [place], [address], [place] [address], [place] [address] Phone Number----[phone]  2)Repeat CBC and BMP Blood Tests in 5 to 7 days advised  3)Take Fosfomycin 3 gm one time on Friday 03/10/22 - NEW PATIENT with Irine Seal, MD Thursday Mar 09, 2022 11:15 AM [facility] [place] [employer] [place] [id number] [phone]

## 2022-03-07 NOTE — TOC Transition Note (Signed)
Transition of Care [facility]) - CM/SW Discharge Note   Patient Details  Name: RANEY KOEPPEN MRN: [id number] Date of Birth: July 14, 1940  Transition of Care [facility] CM/SW Contact:  Salome Arnt, LCSW Phone Number: 03/07/2022, 11:48 AM   Clinical Narrative:  Pt d/c today. Will need HHPT/RN. Discussed with pt who is agreeable with no preference on agency. Referred and accepted by Caryl Pina with [facility]. No other needs reported by pt.      Final next level of care: [place] Barriers to Discharge: Barriers Resolved   Patient Goals and CMS Choice   Choice offered to / list presented to : Patient  Discharge Placement                      Patient and family notified of of transfer: 03/07/22  Discharge Plan and Services Additional resources added to the After Visit Summary for                            [facility] Arranged: RN, PT [facility] Agency: [facility] ([place]) Date HH Agency Contacted: 03/07/22 Time [place]: 1245 Representative spoke with at [place]: [facility] ([place]) Interventions SDOH Screenings   Food Insecurity: No Food Insecurity (03/05/2022)  Housing: Low Risk  (03/05/2022)  Transportation Needs: No Transportation Needs (03/05/2022)  Utilities: Not At Risk (03/05/2022)  Tobacco Use: High Risk (03/05/2022)     Readmission Risk Interventions    09/02/2021    3:23 PM  Readmission Risk Prevention Plan  Transportation Screening Complete  PCP or Specialist Appt within 5-7 Days Not Complete  Home Care Screening Complete  Medication Review (RN CM) Complete

## 2022-03-07 NOTE — Evaluation (Signed)
Physical Therapy Evaluation Patient Details Name: Isaac Hunter MRN: [id number] DOB: [birth year] Today's Date: 03/07/2022  History of Present Illness  Isaac Hunter is a 82 y.o. male with medical history significant of hypertension, hyperlipidemia, anemia of chronic disease, CKD stage IV, bladder cancer s/p resection (2012), thrombocytopenia, CAD who presents to the emergency department from home via EMS due to altered mental status.  Patient was unable to provide history due to altered mental status, history was obtained from ED physician and ED medical record.  Per report, patient presents with generalized weakness and altered mental status lethargy and ongoing for several days, he lives alone and ambulates with a cane at baseline.  Family activated EMS, on arrival of EMS team, patient was found to be incontinent of urine and was covered in urine and feces on arrival to the ED.   Clinical Impression  Patient functioning near baseline for functional mobility and gait other than requiring use of RW for longer distances and requiring verbal/tactile for making safe turns using RW with fair carryover.  Patient tolerated sitting up in chair after therapy and encouraged to ambulate with nursing staff as tolerated.  PLAN:  Patient to be discharged home today and discharged from acute physical therapy to care of nursing for ambulation as tolerated for length of stay with recommendations stated below          Recommendations for follow up therapy are one component of a multi-disciplinary discharge planning process, led by the attending physician.  Recommendations may be updated based on patient status, additional functional criteria and insurance authorization.  Follow Up Recommendations Home health PT      Assistance Recommended at Discharge Set up Supervision/Assistance  Patient can return home with the following  A little help with walking and/or transfers;A little help with  bathing/dressing/bathroom;Help with stairs or ramp for entrance;Assistance with cooking/housework    Equipment Recommendations None recommended by PT  Recommendations for Other Services       Functional Status Assessment Patient has had a recent decline in their functional status and demonstrates the ability to make significant improvements in function in a reasonable and predictable amount of time.     Precautions / Restrictions Precautions Precautions: Fall Restrictions Weight Bearing Restrictions: No      Mobility  Bed Mobility Overal bed mobility: Modified Independent                  Transfers Overall transfer level: Needs assistance Equipment used: Straight cane, Rolling walker (2 wheels) Transfers: Sit to/from Stand, Bed to chair/wheelchair/BSC Sit to Stand: Modified independent (Device/Increase time), Supervision   Step pivot transfers: Supervision, Modified independent (Device/Increase time)       General transfer comment: has to lean on armrest of chair when not using an AD    Ambulation/Gait Ambulation/Gait assistance: Supervision Gait Distance (Feet): 80 Feet Assistive device: Rolling walker (2 wheels) Gait Pattern/deviations: Decreased step length - right, Decreased step length - left, Decreased stride length Gait velocity: decreased     General Gait Details: slightly labored cadence requiring verbal/tactile cue for making turns using RW with fair carryover, and no loss of balance using SPC or RW  Stairs            Wheelchair Mobility    Modified Rankin (Stroke Patients Only)       Balance Overall balance assessment: Needs assistance Sitting-balance support: Feet supported, No upper extremity supported Sitting balance-Leahy Scale: Good Sitting balance - Comments: seated at EOB   Standing  balance support: During functional activity, Single extremity supported Standing balance-Leahy Scale: Fair Standing balance comment: fair/good  using SPC or RW                             Pertinent Vitals/Pain Pain Assessment Pain Assessment: No/denies pain    Home Living Family/patient expects to be discharged to:: Private residence Living Arrangements: Other relatives Available Help at Discharge: Family;Available PRN/intermittently Type of Home: House Home Access: Stairs to enter Entrance Stairs-Rails: Right Entrance Stairs-Number of Steps: 5   Home Layout: One level Home Equipment: Conservation officer, nature (2 wheels);Grab bars - tub/shower;Hand held shower head;Cane - single point      Prior Function Prior Level of Function : Needs assist       Physical Assist : Mobility (physical);ADLs (physical) Mobility (physical): Bed mobility;Transfers;Gait;Stairs   Mobility Comments: Household ambulator using [place] most of time, RW PRN ADLs Comments: Independent     Hand Dominance        Extremity/Trunk Assessment   Upper Extremity Assessment Upper Extremity Assessment: Overall WFL for tasks assessed    Lower Extremity Assessment Lower Extremity Assessment: Generalized weakness    Cervical / Trunk Assessment Cervical / Trunk Assessment: Normal  Communication   Communication: No difficulties  Cognition Arousal/Alertness: Awake/alert Behavior During Therapy: WFL for tasks assessed/performed Overall Cognitive Status: Within Functional Limits for tasks assessed                                          General Comments      Exercises     Assessment/Plan    PT Assessment All further PT needs can be met in the next venue of care  PT Problem List Decreased strength;Decreased range of motion;Decreased activity tolerance;Decreased balance;Decreased mobility       PT Treatment Interventions      PT Goals (Current goals can be found in the Care Plan section)  Acute Rehab PT Goals Patient Stated Goal: return home with family to assist PT Goal Formulation: With patient Time For Goal  Achievement: 03/07/22 Potential to Achieve Goals: Good    Frequency       Co-evaluation               AM-PAC PT "6 Clicks" Mobility  Outcome Measure Help needed turning from your back to your side while in a flat bed without using bedrails?: None Help needed moving from lying on your back to sitting on the side of a flat bed without using bedrails?: None Help needed moving to and from a bed to a chair (including a wheelchair)?: A Little Help needed standing up from a chair using your arms (e.g., wheelchair or bedside chair)?: None Help needed to walk in hospital room?: A Little Help needed climbing 3-5 steps with a railing? : A Little 6 Click Score: 21    End of Session   Activity Tolerance: Patient tolerated treatment well;Patient limited by fatigue Patient left: in chair;with call bell/phone within reach Nurse Communication: Mobility status PT Visit Diagnosis: Unsteadiness on feet (R26.81);Other abnormalities of gait and mobility (R26.89);Muscle weakness (generalized) (M62.81)    Time: 4696-2952 PT Time Calculation (min) (ACUTE ONLY): 26 min   Charges:   PT Evaluation $PT Eval Moderate Complexity: 1 Mod PT Treatments $Therapeutic Activity: 23-37 mins        11:55 AM, 03/07/22 Lonell Grandchild,  MPT Physical Therapist with [facility] 336 [phone] office [phone] mobile phone

## 2022-03-08 ENCOUNTER — Ambulatory Visit: Payer: 59 | Admitting: Urology

## 2022-03-09 ENCOUNTER — Ambulatory Visit: Payer: 59 | Admitting: Urology

## 2022-03-10 LAB — CULTURE, BLOOD (ROUTINE X 2)
Culture: NO GROWTH
Culture: NO GROWTH
Special Requests: ADEQUATE
Special Requests: ADEQUATE

## 2022-10-05 ENCOUNTER — Emergency Department (HOSPITAL_COMMUNITY): Payer: 59

## 2022-10-05 ENCOUNTER — Inpatient Hospital Stay (HOSPITAL_COMMUNITY)
Admission: EM | Admit: 2022-10-05 | Discharge: 2022-10-12 | DRG: 812 | Disposition: A | Discharge status: Home Health | Payer: 59 | Attending: Internal Medicine | Admitting: Internal Medicine

## 2022-10-05 ENCOUNTER — Other Ambulatory Visit: Payer: Self-pay

## 2022-10-05 DIAGNOSIS — Z87440 Personal history of urinary (tract) infections: Secondary | ICD-10-CM

## 2022-10-05 DIAGNOSIS — D649 Anemia, unspecified: Secondary | ICD-10-CM | POA: Diagnosis present

## 2022-10-05 DIAGNOSIS — D500 Iron deficiency anemia secondary to blood loss (chronic): Secondary | ICD-10-CM | POA: Diagnosis not present

## 2022-10-05 DIAGNOSIS — D61818 Other pancytopenia: Secondary | ICD-10-CM | POA: Diagnosis present

## 2022-10-05 DIAGNOSIS — D696 Thrombocytopenia, unspecified: Secondary | ICD-10-CM | POA: Diagnosis present

## 2022-10-05 DIAGNOSIS — R001 Bradycardia, unspecified: Secondary | ICD-10-CM | POA: Diagnosis not present

## 2022-10-05 DIAGNOSIS — I1310 Hypertensive heart and chronic kidney disease without heart failure, with stage 1 through stage 4 chronic kidney disease, or unspecified chronic kidney disease: Secondary | ICD-10-CM | POA: Diagnosis present

## 2022-10-05 DIAGNOSIS — N3000 Acute cystitis without hematuria: Secondary | ICD-10-CM

## 2022-10-05 DIAGNOSIS — N390 Urinary tract infection, site not specified: Secondary | ICD-10-CM | POA: Diagnosis present

## 2022-10-05 DIAGNOSIS — W19XXXA Unspecified fall, initial encounter: Principal | ICD-10-CM

## 2022-10-05 DIAGNOSIS — E1122 Type 2 diabetes mellitus with diabetic chronic kidney disease: Secondary | ICD-10-CM | POA: Diagnosis present

## 2022-10-05 DIAGNOSIS — D123 Benign neoplasm of transverse colon: Secondary | ICD-10-CM | POA: Diagnosis present

## 2022-10-05 DIAGNOSIS — N179 Acute kidney failure, unspecified: Secondary | ICD-10-CM | POA: Diagnosis present

## 2022-10-05 DIAGNOSIS — R54 Age-related physical debility: Secondary | ICD-10-CM | POA: Diagnosis present

## 2022-10-05 DIAGNOSIS — D124 Benign neoplasm of descending colon: Secondary | ICD-10-CM | POA: Diagnosis present

## 2022-10-05 DIAGNOSIS — I10 Essential (primary) hypertension: Secondary | ICD-10-CM | POA: Diagnosis present

## 2022-10-05 DIAGNOSIS — Z9181 History of falling: Secondary | ICD-10-CM

## 2022-10-05 DIAGNOSIS — I7143 Infrarenal abdominal aortic aneurysm, without rupture: Secondary | ICD-10-CM | POA: Diagnosis present

## 2022-10-05 DIAGNOSIS — N184 Chronic kidney disease, stage 4 (severe): Secondary | ICD-10-CM | POA: Diagnosis present

## 2022-10-05 DIAGNOSIS — E782 Mixed hyperlipidemia: Secondary | ICD-10-CM | POA: Diagnosis present

## 2022-10-05 DIAGNOSIS — K648 Other hemorrhoids: Secondary | ICD-10-CM | POA: Diagnosis present

## 2022-10-05 DIAGNOSIS — E860 Dehydration: Secondary | ICD-10-CM | POA: Diagnosis present

## 2022-10-05 DIAGNOSIS — Z720 Tobacco use: Secondary | ICD-10-CM | POA: Diagnosis present

## 2022-10-05 DIAGNOSIS — K644 Residual hemorrhoidal skin tags: Secondary | ICD-10-CM | POA: Diagnosis present

## 2022-10-05 DIAGNOSIS — F17210 Nicotine dependence, cigarettes, uncomplicated: Secondary | ICD-10-CM | POA: Diagnosis present

## 2022-10-05 DIAGNOSIS — I2510 Atherosclerotic heart disease of native coronary artery without angina pectoris: Secondary | ICD-10-CM | POA: Diagnosis present

## 2022-10-05 DIAGNOSIS — R195 Other fecal abnormalities: Secondary | ICD-10-CM | POA: Diagnosis present

## 2022-10-05 DIAGNOSIS — K2970 Gastritis, unspecified, without bleeding: Secondary | ICD-10-CM | POA: Diagnosis present

## 2022-10-05 DIAGNOSIS — Z7982 Long term (current) use of aspirin: Secondary | ICD-10-CM

## 2022-10-05 DIAGNOSIS — M4856XA Collapsed vertebra, not elsewhere classified, lumbar region, initial encounter for fracture: Secondary | ICD-10-CM | POA: Diagnosis present

## 2022-10-05 DIAGNOSIS — Z79899 Other long term (current) drug therapy: Secondary | ICD-10-CM

## 2022-10-05 DIAGNOSIS — D122 Benign neoplasm of ascending colon: Secondary | ICD-10-CM | POA: Diagnosis present

## 2022-10-05 DIAGNOSIS — K922 Gastrointestinal hemorrhage, unspecified: Secondary | ICD-10-CM | POA: Diagnosis present

## 2022-10-05 DIAGNOSIS — R531 Weakness: Secondary | ICD-10-CM | POA: Diagnosis not present

## 2022-10-05 LAB — CBC WITH DIFFERENTIAL/PLATELET
Abs Immature Granulocytes: 0.02 10*3/uL (ref 0.00–0.07)
Basophils Absolute: 0 10*3/uL (ref 0.0–0.1)
Basophils Relative: 0 %
Eosinophils Absolute: 0.1 10*3/uL (ref 0.0–0.5)
Eosinophils Relative: 2 %
HCT: 15 % — ABNORMAL LOW (ref 39.0–52.0)
Hemoglobin: 4.5 g/dL — CL (ref 13.0–17.0)
Immature Granulocytes: 0 %
Lymphocytes Relative: 15 %
Lymphs Abs: 0.9 10*3/uL (ref 0.7–4.0)
MCH: 24.1 pg — ABNORMAL LOW (ref 26.0–34.0)
MCHC: 30 g/dL (ref 30.0–36.0)
MCV: 80.2 fL (ref 80.0–100.0)
Monocytes Absolute: 0.7 10*3/uL (ref 0.1–1.0)
Monocytes Relative: 12 %
Neutro Abs: 4 10*3/uL (ref 1.7–7.7)
Neutrophils Relative %: 71 %
Platelets: 99 10*3/uL — ABNORMAL LOW (ref 150–400)
RBC: 1.87 MIL/uL — ABNORMAL LOW (ref 4.22–5.81)
RDW: 15.3 % (ref 11.5–15.5)
WBC: 5.8 10*3/uL (ref 4.0–10.5)
nRBC: 0 % (ref 0.0–0.2)

## 2022-10-05 LAB — URINALYSIS, ROUTINE W REFLEX MICROSCOPIC
Bilirubin Urine: NEGATIVE
Glucose, UA: NEGATIVE mg/dL
Ketones, ur: NEGATIVE mg/dL
Nitrite: NEGATIVE
Protein, ur: 100 mg/dL — AB
Specific Gravity, Urine: 1.01 (ref 1.005–1.030)
WBC, UA: >50 WBC/hpf (ref 0–5)
pH: 7 (ref 5.0–8.0)

## 2022-10-05 LAB — POC OCCULT BLOOD, ED: Fecal Occult Bld: POSITIVE — AB

## 2022-10-05 LAB — MAGNESIUM: Magnesium: 2.2 mg/dL (ref 1.7–2.4)

## 2022-10-05 LAB — BASIC METABOLIC PANEL
Anion gap: 9 (ref 5–15)
BUN: 65 mg/dL — ABNORMAL HIGH (ref 8–23)
CO2: 19 mmol/L — ABNORMAL LOW (ref 22–32)
Calcium: 8.2 mg/dL — ABNORMAL LOW (ref 8.9–10.3)
Chloride: 113 mmol/L — ABNORMAL HIGH (ref 98–111)
Creatinine, Ser: 4.73 mg/dL — ABNORMAL HIGH (ref 0.61–1.24)
GFR, Estimated: 12 mL/min — ABNORMAL LOW (ref >60)
Glucose, Bld: 99 mg/dL (ref 70–99)
Potassium: 4.2 mmol/L (ref 3.5–5.1)
Sodium: 141 mmol/L (ref 135–145)

## 2022-10-05 LAB — CK: Total CK: 272 U/L (ref 49–397)

## 2022-10-05 MED ORDER — SODIUM CHLORIDE 0.9 % IV SOLN
1.0000 g | Freq: Once | INTRAVENOUS | Status: AC
  Administered 2022-10-05: 1 g via INTRAVENOUS
  Filled 2022-10-05: qty 10

## 2022-10-05 NOTE — ED Triage Notes (Addendum)
Pt was brought in by [facility]. Daughter in law states that she went to check on him and found him on the ground.  He apparently slid out of the chair and was unable to get up since 8am this morning.  Pt denies hitting his head and he denies any LOC. Pt smells of foul smelling urine, he has been incontinent while on the ground.  Pt lives by himself.  Pt denies any pain or injury.  Pt has been having generalized weakness since yesterday (family checks on him at least once a day).  Pt is tearful and calm, per EMS son advised that he is this way sometimes when he has to come to the ED.

## 2022-10-05 NOTE — ED Provider Notes (Addendum)
[place] EMERGENCY DEPARTMENT AT [facility] Provider Note   CSN: [id number] Arrival date & time: 10/05/22  1914     History  Chief Complaint  Patient presents with   Isaac Hunter is a 82 y.o. male.  Patient brought in by [facility].  Family normally checks on him once a day he lives in a trailer by himself.  Patient states he slid out of chair and was unable to get up since 8:00 this morning.  Patient denies hitting his head denies any hip pain or any pain in his legs or upper extremities or back pain or neck pain denies any head pain no loss of consciousness.  They noted that he smelled foul urine.  Patient has been incontinent.  Temp on arrival was 97.7 blood pressure 163/73 heart rate 81 respirations 20.  Patient is not on any blood thinners.  Past medical history significant for coronary artery disease tobacco abuse diabetes and hypertension.  Patient's had transurethral resection of the bladder in 2012.  Patient is an everyday smoker.  Patient last seen in the emergency department January 2024 for delirium and was admitted at that time.  Patient is currently very alert.  Able to follow commands.       Home Medications Prior to Admission medications   Medication Sig Start Date End Date Taking? Authorizing Provider  aspirin EC 81 MG tablet Take 1 tablet (81 mg total) by mouth daily with breakfast. 03/07/22  Yes Emokpae, Courage, MD  atorvastatin (LIPITOR) 80 MG tablet Take 1 tablet (80 mg total) by mouth daily. 09/12/21  Yes Emokpae, Courage, MD  chlorthalidone (HYGROTON) 25 MG tablet Take 25 mg by mouth daily. 08/08/19  Yes [provider]  furosemide (LASIX) 20 MG tablet Take 1 tablet (20 mg total) by mouth every morning. 03/07/22  Yes Shon Hale, MD  acetaminophen (TYLENOL) 325 MG tablet Take 2 tablets (650 mg total) by mouth every 6 (six) hours as needed for up to 30 doses for mild pain. 12/07/19   Terald Sleeper, MD  amLODipine  (NORVASC) 10 MG tablet Take 1 tablet (10 mg total) by mouth daily. For BP 03/07/22 03/07/23  Shon Hale, MD  carvedilol (COREG) 6.25 MG tablet Take 1 tablet (6.25 mg total) by mouth 2 (two) times daily with a meal. For BP 03/07/22   Emokpae, Courage, MD  cephALEXin (KEFLEX) 500 MG capsule Take 500 mg by mouth 4 (four) times daily. Patient not taking: Reported on 10/05/2022 05/16/22   [provider]  Cholecalciferol 25 MCG (1000 UT) tablet Take 2,000 Units by mouth daily.     [provider]  FEROSUL 325 (65 Fe) MG tablet Take 1 tablet (325 mg total) by mouth daily with breakfast. 03/07/22   Mariea Clonts, Courage, MD  NIFEdipine (ADALAT CC) 30 MG 24 hr tablet Take 1 tablet by mouth daily. 08/08/19   [provider]  nitrofurantoin, macrocrystal-monohydrate, (MACROBID) 100 MG capsule Take 100 mg by mouth 2 (two) times daily. Patient not taking: Reported on 10/05/2022 05/18/22   [provider]  quinapril (ACCUPRIL) 40 MG tablet Take 20 mg by mouth daily. Patient not taking: Reported on 10/05/2022 12/19/19   [provider]  senna-docusate (SENOKOT-S) 8.6-50 MG tablet Take 2 tablets by mouth at bedtime. 03/07/22   Shon Hale, MD      Allergies    Patient has no known allergies.    Review of Systems   Review of Systems  Constitutional:  Negative for chills and fever.  HENT:  Negative for ear pain and sore throat.   Eyes:  Negative for pain and visual disturbance.  Respiratory:  Negative for cough and shortness of breath.   Cardiovascular:  Negative for chest pain and palpitations.  Gastrointestinal:  Negative for abdominal pain and vomiting.  Genitourinary:  Negative for dysuria and hematuria.  Musculoskeletal:  Negative for arthralgias and back pain.  Skin:  Negative for color change and rash.  Neurological:  Negative for seizures and syncope.  All other systems reviewed and are negative.   Physical Exam Updated Vital Signs BP (!) 149/108 (BP  Location: Left Arm)   Pulse 91   Temp 97.7 F (36.5 C) (Oral)   Resp 17   Ht 1.778 m (5\' 10" )   Wt 68 kg   SpO2 97%   BMI 21.52 kg/m  Physical Exam Vitals and nursing note reviewed.  Constitutional:      General: He is not in acute distress.    Appearance: Normal appearance. He is well-developed.  HENT:     Head: Normocephalic and atraumatic.     Mouth/Throat:     Mouth: Mucous membranes are dry.  Eyes:     General: No scleral icterus.    Extraocular Movements: Extraocular movements intact.     Conjunctiva/sclera: Conjunctivae normal.     Pupils: Pupils are equal, round, and reactive to light.  Neck:     Comments: No posterior tenderness to palpation to the cervical spine. Cardiovascular:     Rate and Rhythm: Normal rate and regular rhythm.     Heart sounds: No murmur heard. Pulmonary:     Effort: Pulmonary effort is normal. No respiratory distress.     Breath sounds: Normal breath sounds.  Abdominal:     Palpations: Abdomen is soft.     Tenderness: There is no abdominal tenderness. There is no guarding.  Genitourinary:    Rectum: Normal. Guaiac result positive.  Musculoskeletal:        General: No swelling or tenderness.     Cervical back: Normal range of motion and neck supple. No tenderness.     Comments: Patient without any tenderness to palpation to bilateral hips good movement of both lower extremities.  Strength seems to be good.  Same for upper extremities.  Back nontender to palpation.  Skin:    General: Skin is warm and dry.     Capillary Refill: Capillary refill takes less than 2 seconds.  Neurological:     General: No focal deficit present.     Mental Status: He is alert and oriented to person, place, and time.  Psychiatric:        Mood and Affect: Mood normal.     ED Results / Procedures / Treatments   Labs (all labs ordered are listed, but only abnormal results are displayed) Labs Reviewed  URINALYSIS, ROUTINE W REFLEX MICROSCOPIC - Abnormal;  Notable for the following components:      Result Value   APPearance CLOUDY (*)    Hgb urine dipstick SMALL (*)    Protein, ur 100 (*)    Leukocytes,Ua LARGE (*)    Bacteria, UA MANY (*)    All other components within normal limits  BASIC METABOLIC PANEL - Abnormal; Notable for the following components:   Chloride 113 (*)    CO2 19 (*)    BUN 65 (*)    Creatinine, Ser 4.73 (*)    Calcium 8.2 (*)  GFR, Estimated 12 (*)    All other components within normal limits  CBC WITH DIFFERENTIAL/PLATELET - Abnormal; Notable for the following components:   RBC 1.87 (*)    Hemoglobin 4.5 (*)    HCT 15.0 (*)    MCH 24.1 (*)    Platelets 99 (*)    All other components within normal limits  POC OCCULT BLOOD, ED - Abnormal  POC OCCULT BLOOD, ED - Abnormal; Notable for the following components:   Fecal Occult Bld POSITIVE (*)    All other components within normal limits  URINE CULTURE  CK  MAGNESIUM  TYPE AND SCREEN  PREPARE RBC (CROSSMATCH)    EKG None  Radiology CT ABDOMEN PELVIS WO CONTRAST  Result Date: 10/06/2022 CLINICAL DATA:  Abdominal pain.  Concern for kidney stone. EXAM: CT ABDOMEN AND PELVIS WITHOUT CONTRAST TECHNIQUE: Multidetector CT imaging of the abdomen and pelvis was performed following the standard protocol without IV contrast. RADIATION DOSE REDUCTION: This exam was performed according to the departmental dose-optimization program which includes automated exposure control, adjustment of the mA and/or kV according to patient size and/or use of iterative reconstruction technique. COMPARISON:  CT abdomen pelvis dated 03/05/2022. FINDINGS: Evaluation of this exam is limited in the absence of intravenous contrast. Lower chest: Partially visualized small right and trace left pleural effusions. There is diffuse interstitial and interlobular septal prominence of the visualized lungs consistent with edema. There is hypoattenuation of the cardiac blood pool suggestive of anemia.  Clinical correlation is recommended. Coronary vascular calcification. No intra-abdominal free air or free fluid. Hepatobiliary: The liver is unremarkable. The gallbladder is unremarkable. Pancreas: Unremarkable. No pancreatic ductal dilatation or surrounding inflammatory changes. Spleen: Normal in size without focal abnormality. Adrenals/Urinary Tract: Bilateral adrenal thickening/hyperplasia or adenoma as seen on the prior CT. Bilateral renal vascular calcifications. Several small nonobstructing renal calculi may be present. There is no hydronephrosis or obstructing stone on either side. The visualized ureters appear unremarkable. There is a small left posterior bladder wall diverticulum. Mild haziness of the bladder wall. Correlation with urinalysis recommended to exclude UTI. Stomach/Bowel: There is no bowel obstruction or active inflammation. The appendix is normal. Vascular/Lymphatic: Advanced aortoiliac atherosclerotic disease. There is a 5 cm fusiform infrarenal abdominal aortic aneurysm. The IVC is unremarkable. No portal venous gas. There is no adenopathy. Reproductive: The prostate and seminal vesicles are grossly unremarkable. No pelvic mass. Other: None Musculoskeletal: There is compression fracture of superior endplate of L1 with approximately 50% loss of vertebral body height and anterior wedging, new since the prior CT. Correlation with clinical exam and point tenderness recommended. There is approximately 7 mm retropulsion of the posterosuperior cortex with mild focal narrowing of the central canal. IMPRESSION: 1. No hydronephrosis or obstructing stone. 2. Mild haziness of the bladder wall. Correlation with urinalysis recommended to exclude UTI. 3. No bowel obstruction. Normal appendix. 4. A 5 cm fusiform infrarenal abdominal aortic aneurysm. Recommend follow-up CT/MR every 6 months and vascular consultation. This recommendation follows ACR consensus guidelines: White Paper of the ACR Incidental  Findings Committee II on Vascular Findings. J Am Coll Radiol 2013; 10:789-794. 5. Compression fracture of superior endplate of L1, new since the prior CT. Correlation with clinical exam and point tenderness recommended. 6. Partially visualized small right and trace left pleural effusions. 7.  Aortic Atherosclerosis (ICD10-I70.0). Electronically Signed   By: Elgie Collard M.D.   On: 10/06/2022 00:00   DG Chest Port 1 View  Result Date: 10/05/2022 CLINICAL DATA:  Anemia. Patient was  found on the floor. Generalized weakness. Decreased hemoglobin. History of heart disease, hypertension, diabetes, tobacco abuse. EXAM: PORTABLE CHEST 1 VIEW COMPARISON:  03/05/2022 FINDINGS: Cardiac enlargement with pulmonary vascular congestion. Perihilar and basilar interstitial changes likely represent mild edema. No focal consolidation. No pleural effusions. No pneumothorax. Mediastinal contours appear intact. Calcification of the aorta. IMPRESSION: Cardiac enlargement with pulmonary vascular congestion and mild interstitial edema. Electronically Signed   By: Burman Nieves M.D.   On: 10/05/2022 23:22    Procedures Procedures    Medications Ordered in ED Medications  0.9 %  sodium chloride infusion (Manually program via Guardrails IV Fluids) (has no administration in time range)  0.9 %  sodium chloride infusion (has no administration in time range)  sodium chloride 0.9 % bolus 500 mL (has no administration in time range)  cefTRIAXone (ROCEPHIN) 1 g in sodium chloride 0.9 % 100 mL IVPB (0 g Intravenous Stopped 10/05/22 2346)    ED Course/ Medical Decision Making/ A&P                                 Medical Decision Making Amount and/or Complexity of Data Reviewed Labs: ordered. Radiology: ordered.  Risk Prescription drug management. Decision regarding hospitalization.   CRITICAL CARE Performed by: Vanetta Mulders Total critical care time: 60 minutes Critical care time was exclusive of separately  billable procedures and treating other patients. Critical care was necessary to treat or prevent imminent or life-threatening deterioration. Critical care was time spent personally by me on the following activities: development of treatment plan with patient and/or surrogate as well as nursing, discussions with consultants, evaluation of patient's response to treatment, examination of patient, obtaining history from patient or surrogate, ordering and performing treatments and interventions, ordering and review of laboratory studies, ordering and review of radiographic studies, pulse oximetry and re-evaluation of patient's condition.  Patient with significant anemia may be secondary to heme positive stool.  Not grossly bloody or melanotic.  Will order 2 units of blood to start with.  Also evidence of urinary tract infection evidence of acute kidney injury.  Because of this we will get CT of abdomen just to make sure he does not have an obstruction.  Patient's creatinine is 4.73 with a GFR of 12 no leukocytosis hemoglobin 4.5 platelets down a little bit at 99 but not in critical level.  Urinalysis had greater than 50 white blood cells and many bacteria.  Will initiate with urine culture and get Rocephin for that.  Will get chest x-ray CT abdomen and pelvis and will require admission.  CT scan of the abdomen without any significant acute findings.  Haziness of the bladder goes along with his history of UTI.  No hydronephrosis no obstructing stone.  There is a 5 cm fusiform infrarenal abdominal aortic aneurysm will need follow-up.  There is evidence of compression fracture superior endplate of L1 new since prior CT patient not really tender there.  But did slip out of a chair today and could be related to today's events.  Chest x-ray raises cardiac enlargement with some pulmonary vascular congestion and mild interstitial edema.  With him receiving fluids what to follow this carefully.  Will discuss with  hospitalist for admission.  Final Clinical Impression(s) / ED Diagnoses Final diagnoses:  Fall, initial encounter  Anemia, unspecified type  Acute kidney injury (HCC)  Acute cystitis without hematuria  Dehydration    Rx / DC Orders ED Discharge  Orders     None         Vanetta Mulders, MD 10/05/22 2300    Vanetta Mulders, MD 10/06/22 [phone]

## 2022-10-05 NOTE — ED Notes (Signed)
Critical hemoglobin 4.5

## 2022-10-05 NOTE — ED Notes (Signed)
Blood consent obtained

## 2022-10-05 NOTE — ED Notes (Signed)
Soiled clothing removed and bagged.  Pt washed with warm water and peri cleaner and placed pt in gown

## 2022-10-06 ENCOUNTER — Inpatient Hospital Stay (HOSPITAL_COMMUNITY): Payer: 59 | Admitting: Certified Registered Nurse Anesthetist

## 2022-10-06 ENCOUNTER — Encounter (HOSPITAL_COMMUNITY): Payer: Self-pay | Admitting: Family Medicine

## 2022-10-06 ENCOUNTER — Encounter (HOSPITAL_COMMUNITY)
Admission: EM | Disposition: A | Discharge status: Home Health | Payer: Self-pay | Source: Home / Self Care | Attending: Family Medicine

## 2022-10-06 DIAGNOSIS — D696 Thrombocytopenia, unspecified: Secondary | ICD-10-CM | POA: Diagnosis present

## 2022-10-06 DIAGNOSIS — K2970 Gastritis, unspecified, without bleeding: Secondary | ICD-10-CM

## 2022-10-06 DIAGNOSIS — K922 Gastrointestinal hemorrhage, unspecified: Secondary | ICD-10-CM | POA: Diagnosis present

## 2022-10-06 DIAGNOSIS — D649 Anemia, unspecified: Secondary | ICD-10-CM

## 2022-10-06 DIAGNOSIS — D500 Iron deficiency anemia secondary to blood loss (chronic): Principal | ICD-10-CM

## 2022-10-06 DIAGNOSIS — D123 Benign neoplasm of transverse colon: Secondary | ICD-10-CM | POA: Diagnosis present

## 2022-10-06 DIAGNOSIS — K5090 Crohn's disease, unspecified, without complications: Secondary | ICD-10-CM | POA: Diagnosis not present

## 2022-10-06 DIAGNOSIS — I1310 Hypertensive heart and chronic kidney disease without heart failure, with stage 1 through stage 4 chronic kidney disease, or unspecified chronic kidney disease: Secondary | ICD-10-CM | POA: Diagnosis present

## 2022-10-06 DIAGNOSIS — N390 Urinary tract infection, site not specified: Secondary | ICD-10-CM | POA: Diagnosis present

## 2022-10-06 DIAGNOSIS — D61818 Other pancytopenia: Secondary | ICD-10-CM | POA: Diagnosis present

## 2022-10-06 DIAGNOSIS — Z79899 Other long term (current) drug therapy: Secondary | ICD-10-CM | POA: Diagnosis not present

## 2022-10-06 DIAGNOSIS — I2510 Atherosclerotic heart disease of native coronary artery without angina pectoris: Secondary | ICD-10-CM | POA: Diagnosis present

## 2022-10-06 DIAGNOSIS — K648 Other hemorrhoids: Secondary | ICD-10-CM | POA: Diagnosis present

## 2022-10-06 DIAGNOSIS — K2971 Gastritis, unspecified, with bleeding: Secondary | ICD-10-CM

## 2022-10-06 DIAGNOSIS — R001 Bradycardia, unspecified: Secondary | ICD-10-CM | POA: Diagnosis not present

## 2022-10-06 DIAGNOSIS — N3000 Acute cystitis without hematuria: Secondary | ICD-10-CM | POA: Diagnosis not present

## 2022-10-06 DIAGNOSIS — K644 Residual hemorrhoidal skin tags: Secondary | ICD-10-CM | POA: Diagnosis present

## 2022-10-06 DIAGNOSIS — Z9181 History of falling: Secondary | ICD-10-CM | POA: Diagnosis not present

## 2022-10-06 DIAGNOSIS — M4856XA Collapsed vertebra, not elsewhere classified, lumbar region, initial encounter for fracture: Secondary | ICD-10-CM | POA: Diagnosis present

## 2022-10-06 DIAGNOSIS — I129 Hypertensive chronic kidney disease with stage 1 through stage 4 chronic kidney disease, or unspecified chronic kidney disease: Secondary | ICD-10-CM

## 2022-10-06 DIAGNOSIS — E1122 Type 2 diabetes mellitus with diabetic chronic kidney disease: Secondary | ICD-10-CM

## 2022-10-06 DIAGNOSIS — Z720 Tobacco use: Secondary | ICD-10-CM | POA: Diagnosis not present

## 2022-10-06 DIAGNOSIS — E860 Dehydration: Secondary | ICD-10-CM | POA: Diagnosis present

## 2022-10-06 DIAGNOSIS — I7143 Infrarenal abdominal aortic aneurysm, without rupture: Secondary | ICD-10-CM | POA: Diagnosis present

## 2022-10-06 DIAGNOSIS — N184 Chronic kidney disease, stage 4 (severe): Secondary | ICD-10-CM

## 2022-10-06 DIAGNOSIS — D124 Benign neoplasm of descending colon: Secondary | ICD-10-CM | POA: Diagnosis present

## 2022-10-06 DIAGNOSIS — R195 Other fecal abnormalities: Secondary | ICD-10-CM | POA: Diagnosis present

## 2022-10-06 DIAGNOSIS — W19XXXA Unspecified fall, initial encounter: Secondary | ICD-10-CM | POA: Diagnosis not present

## 2022-10-06 DIAGNOSIS — E782 Mixed hyperlipidemia: Secondary | ICD-10-CM | POA: Diagnosis present

## 2022-10-06 DIAGNOSIS — F17210 Nicotine dependence, cigarettes, uncomplicated: Secondary | ICD-10-CM | POA: Diagnosis present

## 2022-10-06 DIAGNOSIS — R531 Weakness: Secondary | ICD-10-CM | POA: Diagnosis present

## 2022-10-06 DIAGNOSIS — N179 Acute kidney failure, unspecified: Secondary | ICD-10-CM | POA: Diagnosis present

## 2022-10-06 DIAGNOSIS — D122 Benign neoplasm of ascending colon: Secondary | ICD-10-CM | POA: Diagnosis present

## 2022-10-06 DIAGNOSIS — R54 Age-related physical debility: Secondary | ICD-10-CM | POA: Diagnosis present

## 2022-10-06 HISTORY — PX: BIOPSY: SHX5522

## 2022-10-06 HISTORY — PX: ESOPHAGOGASTRODUODENOSCOPY (EGD) WITH PROPOFOL: SHX5813

## 2022-10-06 LAB — RETICULOCYTES
Immature Retic Fract: 33.9 % — ABNORMAL HIGH (ref 2.3–15.9)
RBC.: 3.06 MIL/uL — ABNORMAL LOW (ref 4.22–5.81)
Retic Count, Absolute: 69.8 10*3/uL (ref 19.0–186.0)
Retic Ct Pct: 2.3 % (ref 0.4–3.1)

## 2022-10-06 LAB — CBC
HCT: 24.7 % — ABNORMAL LOW (ref 39.0–52.0)
HCT: 23.4 % — ABNORMAL LOW (ref 39.0–52.0)
Hemoglobin: 7.8 g/dL — ABNORMAL LOW (ref 13.0–17.0)
Hemoglobin: 7.4 g/dL — ABNORMAL LOW (ref 13.0–17.0)
MCH: 25.7 pg — ABNORMAL LOW (ref 26.0–34.0)
MCH: 25.8 pg — ABNORMAL LOW (ref 26.0–34.0)
MCHC: 31.6 g/dL (ref 30.0–36.0)
MCHC: 31.6 g/dL (ref 30.0–36.0)
MCV: 81.5 fL (ref 80.0–100.0)
MCV: 81.5 fL (ref 80.0–100.0)
Platelets: 93 10*3/uL — ABNORMAL LOW (ref 150–400)
Platelets: 92 10*3/uL — ABNORMAL LOW (ref 150–400)
RBC: 3.03 MIL/uL — ABNORMAL LOW (ref 4.22–5.81)
RBC: 2.87 MIL/uL — ABNORMAL LOW (ref 4.22–5.81)
RDW: 15.4 % (ref 11.5–15.5)
RDW: 15.2 % (ref 11.5–15.5)
WBC: 8.5 10*3/uL (ref 4.0–10.5)
WBC: 10.1 10*3/uL (ref 4.0–10.5)
nRBC: 0 % (ref 0.0–0.2)
nRBC: 0 % (ref 0.0–0.2)

## 2022-10-06 LAB — COMPREHENSIVE METABOLIC PANEL
ALT: 14 U/L (ref 0–44)
AST: 20 U/L (ref 15–41)
Albumin: 2.9 g/dL — ABNORMAL LOW (ref 3.5–5.0)
Alkaline Phosphatase: 50 U/L (ref 38–126)
Anion gap: 7 (ref 5–15)
BUN: 61 mg/dL — ABNORMAL HIGH (ref 8–23)
CO2: 18 mmol/L — ABNORMAL LOW (ref 22–32)
Calcium: 8.2 mg/dL — ABNORMAL LOW (ref 8.9–10.3)
Chloride: 117 mmol/L — ABNORMAL HIGH (ref 98–111)
Creatinine, Ser: 4.45 mg/dL — ABNORMAL HIGH (ref 0.61–1.24)
GFR, Estimated: 12 mL/min — ABNORMAL LOW (ref >60)
Glucose, Bld: 108 mg/dL — ABNORMAL HIGH (ref 70–99)
Potassium: 4.1 mmol/L (ref 3.5–5.1)
Sodium: 142 mmol/L (ref 135–145)
Total Bilirubin: 1.3 mg/dL — ABNORMAL HIGH (ref 0.3–1.2)
Total Protein: 6 g/dL — ABNORMAL LOW (ref 6.5–8.1)

## 2022-10-06 LAB — PREPARE RBC (CROSSMATCH)

## 2022-10-06 LAB — IRON AND TIBC
Iron: 82 ug/dL (ref 45–182)
Saturation Ratios: 26 % (ref 17.9–39.5)
TIBC: 316 ug/dL (ref 250–450)
UIBC: 234 ug/dL

## 2022-10-06 LAB — MAGNESIUM: Magnesium: 2.1 mg/dL (ref 1.7–2.4)

## 2022-10-06 LAB — VITAMIN B12: Vitamin B-12: 401 pg/mL (ref 180–914)

## 2022-10-06 LAB — FOLATE: Folate: 8.2 ng/mL (ref >5.9)

## 2022-10-06 LAB — FERRITIN: Ferritin: 40 ng/mL (ref 24–336)

## 2022-10-06 SURGERY — ESOPHAGOGASTRODUODENOSCOPY (EGD) WITH PROPOFOL
Anesthesia: General

## 2022-10-06 MED ORDER — SODIUM CHLORIDE 0.9% IV SOLUTION: Freq: Once | INTRAVENOUS | Status: AC

## 2022-10-06 MED ORDER — NICOTINE 21 MG/24HR TD PT24
21.0000 mg | MEDICATED_PATCH | Freq: Every day | TRANSDERMAL | Status: DC
  Administered 2022-10-06 – 2022-10-12 (×6): 21 mg via TRANSDERMAL
  Filled 2022-10-06 (×7): qty 1

## 2022-10-06 MED ORDER — ONDANSETRON HCL 4 MG/2ML IJ SOLN: 4.0000 mg | Freq: Four times a day (QID) | INTRAMUSCULAR | Status: DC | PRN

## 2022-10-06 MED ORDER — ACETAMINOPHEN 325 MG PO TABS: 650.0000 mg | ORAL_TABLET | Freq: Four times a day (QID) | ORAL | Status: DC | PRN

## 2022-10-06 MED ORDER — PROPOFOL 500 MG/50ML IV EMUL
INTRAVENOUS | Status: DC | PRN
  Administered 2022-10-06: 100 ug/kg/min via INTRAVENOUS

## 2022-10-06 MED ORDER — ATORVASTATIN CALCIUM 40 MG PO TABS
80.0000 mg | ORAL_TABLET | Freq: Every day | ORAL | Status: DC
  Administered 2022-10-06 – 2022-10-12 (×6): 80 mg via ORAL
  Filled 2022-10-06 (×7): qty 2

## 2022-10-06 MED ORDER — SODIUM CHLORIDE 0.9 % IV SOLN: INTRAVENOUS | Status: DC

## 2022-10-06 MED ORDER — LACTATED RINGERS IV SOLN: INTRAVENOUS | Status: AC

## 2022-10-06 MED ORDER — SODIUM CHLORIDE 0.9 % IV SOLN
1.0000 g | INTRAVENOUS | Status: DC
  Administered 2022-10-06: 1 g via INTRAVENOUS
  Filled 2022-10-06: qty 10

## 2022-10-06 MED ORDER — PANTOPRAZOLE SODIUM 40 MG IV SOLR
40.0000 mg | Freq: Two times a day (BID) | INTRAVENOUS | Status: DC
  Administered 2022-10-06 – 2022-10-12 (×13): 40 mg via INTRAVENOUS
  Filled 2022-10-06 (×14): qty 10

## 2022-10-06 MED ORDER — MORPHINE SULFATE (PF) 2 MG/ML IV SOLN: 2.0000 mg | INTRAVENOUS | Status: DC | PRN

## 2022-10-06 MED ORDER — LACTATED RINGERS IV SOLN: INTRAVENOUS | Status: DC

## 2022-10-06 MED ORDER — CARVEDILOL 3.125 MG PO TABS
6.2500 mg | ORAL_TABLET | Freq: Two times a day (BID) | ORAL | Status: DC
  Administered 2022-10-06 – 2022-10-12 (×13): 6.25 mg via ORAL
  Filled 2022-10-06 (×14): qty 2

## 2022-10-06 MED ORDER — ONDANSETRON HCL 4 MG PO TABS: 4.0000 mg | ORAL_TABLET | Freq: Four times a day (QID) | ORAL | Status: DC | PRN

## 2022-10-06 MED ORDER — AMLODIPINE BESYLATE 5 MG PO TABS
10.0000 mg | ORAL_TABLET | Freq: Every day | ORAL | Status: DC
  Administered 2022-10-06 – 2022-10-12 (×6): 10 mg via ORAL
  Filled 2022-10-06 (×7): qty 2

## 2022-10-06 MED ORDER — OXYCODONE HCL 5 MG PO TABS: 5.0000 mg | ORAL_TABLET | ORAL | Status: DC | PRN

## 2022-10-06 MED ORDER — PROPOFOL 10 MG/ML IV BOLUS
INTRAVENOUS | Status: DC | PRN
  Administered 2022-10-06: 40 mg via INTRAVENOUS

## 2022-10-06 MED ORDER — LIDOCAINE HCL (PF) 2 % IJ SOLN
INTRAMUSCULAR | Status: DC | PRN
  Administered 2022-10-06: 50 mg via INTRADERMAL

## 2022-10-06 MED ORDER — ACETAMINOPHEN 650 MG RE SUPP: 650.0000 mg | Freq: Four times a day (QID) | RECTAL | Status: DC | PRN

## 2022-10-06 MED ORDER — SODIUM CHLORIDE 0.9 % IV BOLUS
500.0000 mL | Freq: Once | INTRAVENOUS | Status: AC
  Administered 2022-10-06: 500 mL via INTRAVENOUS

## 2022-10-06 MED ORDER — HYDRALAZINE HCL 20 MG/ML IJ SOLN
10.0000 mg | INTRAMUSCULAR | Status: DC | PRN
  Administered 2022-10-09 – 2022-10-10 (×3): 10 mg via INTRAVENOUS
  Filled 2022-10-06 (×3): qty 1

## 2022-10-06 MED ORDER — FUROSEMIDE 10 MG/ML IJ SOLN
20.0000 mg | Freq: Once | INTRAMUSCULAR | Status: AC
  Administered 2022-10-06: 20 mg via INTRAVENOUS
  Filled 2022-10-06: qty 2

## 2022-10-06 NOTE — Progress Notes (Signed)
Subjective: Patient admitted this morning, see detailed H&P by Dr Camillo Flaming  82 y.o. male with medical history significant of coronary artery disease, diabetes mellitus type 2, hypertension, tobacco use disorder, presents to the ED with a chief complaint of fall.  In the ED found to have Hb 4.5, FOBT positive. S/p 2 units PRBC  Vitals:   10/06/22 0605 10/06/22 0700  BP: (!) 174/84 (!) 186/74  Pulse: 90 89  Resp: 20 (!) 22  Temp:    SpO2: 99% 98%      A/P Blood loss anemia -Presented with hemoglobin of 4.5, FOBT positive -Status post 2 units PRBC -Hemoglobin improved to 7.4 -Follow CBC in a.m.  GI bleed -Underwent EGD which was unremarkable -Found to have gastritis which was biopsied -Follow CBC in a.m.  Acute kidney injury on CKD stage IV -Baseline creatinine 2.67, presented with creatinine of 4.73 -Improved to 4.45 -Will start gentle IV hydration with LR at 75 mL/h -Follow BMP in am  UTI -UA suggestive of UTI -Started on Rocephin -Follow urine culture results  Hypertension -Continue Norvasc, Coreg   Isaac Hunter S [place] [facility]

## 2022-10-06 NOTE — Transfer of Care (Signed)
Immediate Anesthesia Transfer of Care Note  Patient: Isaac Hunter  Procedure(s) Performed: ESOPHAGOGASTRODUODENOSCOPY (EGD) WITH PROPOFOL BIOPSY  Patient Location: PACU  Anesthesia Type:General  Level of Consciousness: drowsy  Airway & Oxygen Therapy: Patient Spontanous Breathing and Patient connected to nasal cannula oxygen  Post-op Assessment: Report given to RN, Post -op Vital signs reviewed and stable, and Patient moving all extremities X 4  Post vital signs: Reviewed and stable  Last Vitals:  Vitals Value Taken Time  BP 111/55   Temp 97.8   Pulse 63 10/06/22 1534  Resp 19 10/06/22 1534  SpO2 100 % 10/06/22 1534    Last Pain:  Vitals:   10/06/22 1516  TempSrc:   PainSc: 0-No pain         Complications: No notable events documented.

## 2022-10-06 NOTE — Consult Note (Signed)
Gastroenterology Consult   Referring Provider: No ref. provider found Primary Care Physician:  [facility], [facility] Primary Gastroenterologist:  Dolores Frame, MD (previously unassigned)  Patient ID: Isaac Hunter; [id number]; [birth year]   Admit date: 10/05/2022  LOS: 0 days   Date of Consultation: 10/06/2022  Reason for Consultation:  concern for GI bleed, anemia, heme positive stool  History of Present Illness   Isaac Hunter is a 82 y.o. year old male with history of CAD, diabetes, HTN, tobacco abuse, and ?CKD (baseline creatinine 2.8-3) who presented to the ED after a fall at home and unintended for few hours.  Labs revealed significant anemia with hemoglobin 4.5 with normocytic indices and heme positive stool.  GI consulted for further evaluation.  ED Course: Vital signs stable, mildly hypertensive.  Afebrile. Labs with hemoglobin 4.5, MCV 80, sodium 141, potassium 4.2, BUN 65 (52-74 in July of last year), creatinine 4.73 (2.67 in January), GFR 12, normal CK Occult positive UA with large amount of leukocytes and many bacteria, UC in progress CXR with mild cardiomegaly and pulmonary vascular congestion with mild interstitial edema CT A/P without contrast with mild haziness of the bladder wall consistent with UTI, no bowel obstruction.  5 cm AAA (recommended CT/MR every 6 months and vascular consult), small right and trace left pleural effusions   Consult: Patient denies any alcohol use, tobacco use, or frequent NSAID use.  Denies any abdominal pain, nausea, vomiting, melena, BRBPR.  He states he has a good appetite.  Getting some history from patient today was difficult therefore I called patient's son Isaac Hunter and spoke to him regarding his father's condition.  He states that he has had some falls at home recently but he is not aware of any black/tarry stools or bright red blood in his stool.  He does confirm with me that he is not taking any NSAIDs  and per review of his medications at home he has not been on any blood thinners.  He does report some intermittent confusion with his father over the last year.  Unable to determine from patient if he has had an upper endoscopy or colonoscopy in the past.  Son also not aware.   Past Medical History:  Diagnosis Date   CAD (coronary artery disease)    Non obstructive per cath 2001.   Diabetes mellitus    hx per pt, no longer has?   Hypertension    hx per pt, no long has??   Tobacco abuse 11/17/2010    Past Surgical History:  Procedure Laterality Date   CYSTOSCOPY  11/21/2010   Procedure: CYSTOSCOPY;  Surgeon: Ky Barban;  Location: [facility];  Service: Urology;  Laterality: N/A;   Left hand surgery     TRANSURETHRAL RESECTION OF BLADDER TUMOR  11/21/2010   Procedure: TRANSURETHRAL RESECTION OF BLADDER TUMOR (TURBT);  Surgeon: Ky Barban;  Location: [facility];  Service: Urology;  Laterality: N/A;  evacuation of clots    Prior to Admission medications   Medication Sig Start Date End Date Taking? Authorizing Provider  aspirin EC 81 MG tablet Take 1 tablet (81 mg total) by mouth daily with breakfast. 03/07/22  Yes Emokpae, Courage, MD  atorvastatin (LIPITOR) 80 MG tablet Take 1 tablet (80 mg total) by mouth daily. 09/12/21  Yes Emokpae, Courage, MD  chlorthalidone (HYGROTON) 25 MG tablet Take 25 mg by mouth daily. 08/08/19  Yes [provider]  furosemide (LASIX) 20 MG tablet Take 1 tablet (20 mg total)  by mouth every morning. 03/07/22  Yes Shon Hale, MD  acetaminophen (TYLENOL) 325 MG tablet Take 2 tablets (650 mg total) by mouth every 6 (six) hours as needed for up to 30 doses for mild pain. 12/07/19   Terald Sleeper, MD  amLODipine (NORVASC) 10 MG tablet Take 1 tablet (10 mg total) by mouth daily. For BP 03/07/22 03/07/23  Shon Hale, MD  carvedilol (COREG) 6.25 MG tablet Take 1 tablet (6.25 mg total) by mouth 2 (two) times daily with a meal. For BP 03/07/22    Emokpae, Courage, MD  cephALEXin (KEFLEX) 500 MG capsule Take 500 mg by mouth 4 (four) times daily. Patient not taking: Reported on 10/05/2022 05/16/22   [provider]  Cholecalciferol 25 MCG (1000 UT) tablet Take 2,000 Units by mouth daily.     [provider]  FEROSUL 325 (65 Fe) MG tablet Take 1 tablet (325 mg total) by mouth daily with breakfast. 03/07/22   Mariea Clonts, Courage, MD  NIFEdipine (ADALAT CC) 30 MG 24 hr tablet Take 1 tablet by mouth daily. 08/08/19   [provider]  nitrofurantoin, macrocrystal-monohydrate, (MACROBID) 100 MG capsule Take 100 mg by mouth 2 (two) times daily. Patient not taking: Reported on 10/05/2022 05/18/22   [provider]  quinapril (ACCUPRIL) 40 MG tablet Take 20 mg by mouth daily. Patient not taking: Reported on 10/05/2022 12/19/19   [provider]  senna-docusate (SENOKOT-S) 8.6-50 MG tablet Take 2 tablets by mouth at bedtime. 03/07/22   Shon Hale, MD    Current Facility-Administered Medications  Medication Dose Route Frequency Provider Last Rate Last Admin   0.9 %  sodium chloride infusion   Intravenous Continuous Zierle-Ghosh, Asia B, DO 100 mL/hr at 10/06/22 0121 New Bag at 10/06/22 0121   acetaminophen (TYLENOL) tablet 650 mg  650 mg Oral Q6H PRN Zierle-Ghosh, Asia B, DO       Or   acetaminophen (TYLENOL) suppository 650 mg  650 mg Rectal Q6H PRN Zierle-Ghosh, Asia B, DO       amLODipine (NORVASC) tablet 10 mg  10 mg Oral Daily Zierle-Ghosh, Asia B, DO       atorvastatin (LIPITOR) tablet 80 mg  80 mg Oral Daily Zierle-Ghosh, Asia B, DO       carvedilol (COREG) tablet 6.25 mg  6.25 mg Oral BID WC Zierle-Ghosh, Asia B, DO       cefTRIAXone (ROCEPHIN) 1 g in sodium chloride 0.9 % 100 mL IVPB  1 g Intravenous Q24H Zierle-Ghosh, Asia B, DO       morphine (PF) 2 MG/ML injection 2 mg  2 mg Intravenous Q2H PRN Zierle-Ghosh, Asia B, DO       nicotine (NICODERM CQ - dosed in mg/24 hours) patch 21 mg  21 mg  Transdermal Daily Zierle-Ghosh, Asia B, DO       ondansetron (ZOFRAN) tablet 4 mg  4 mg Oral Q6H PRN Zierle-Ghosh, Asia B, DO       Or   ondansetron (ZOFRAN) injection 4 mg  4 mg Intravenous Q6H PRN Zierle-Ghosh, Asia B, DO       oxyCODONE (Oxy IR/ROXICODONE) immediate release tablet 5 mg  5 mg Oral Q4H PRN Zierle-Ghosh, Asia B, DO       pantoprazole (PROTONIX) injection 40 mg  40 mg Intravenous Q12H Zierle-Ghosh, Asia B, DO   40 mg at 10/06/22 0121   Current Outpatient Medications  Medication Sig Dispense Refill   aspirin EC 81 MG tablet Take 1 tablet (81 mg  total) by mouth daily with breakfast. 30 tablet 12   atorvastatin (LIPITOR) 80 MG tablet Take 1 tablet (80 mg total) by mouth daily. 90 tablet 3   chlorthalidone (HYGROTON) 25 MG tablet Take 25 mg by mouth daily.     furosemide (LASIX) 20 MG tablet Take 1 tablet (20 mg total) by mouth every morning. 30 tablet 1   acetaminophen (TYLENOL) 325 MG tablet Take 2 tablets (650 mg total) by mouth every 6 (six) hours as needed for up to 30 doses for mild pain. 30 tablet 0   amLODipine (NORVASC) 10 MG tablet Take 1 tablet (10 mg total) by mouth daily. For BP 90 tablet 3   carvedilol (COREG) 6.25 MG tablet Take 1 tablet (6.25 mg total) by mouth 2 (two) times daily with a meal. For BP 180 tablet 3   cephALEXin (KEFLEX) 500 MG capsule Take 500 mg by mouth 4 (four) times daily. (Patient not taking: Reported on 10/05/2022)     Cholecalciferol 25 MCG (1000 UT) tablet Take 2,000 Units by mouth daily.      FEROSUL 325 (65 Fe) MG tablet Take 1 tablet (325 mg total) by mouth daily with breakfast. 90 tablet 3   NIFEdipine (ADALAT CC) 30 MG 24 hr tablet Take 1 tablet by mouth daily.     nitrofurantoin, macrocrystal-monohydrate, (MACROBID) 100 MG capsule Take 100 mg by mouth 2 (two) times daily. (Patient not taking: Reported on 10/05/2022)     quinapril (ACCUPRIL) 40 MG tablet Take 20 mg by mouth daily. (Patient not taking: Reported on 10/05/2022)      senna-docusate (SENOKOT-S) 8.6-50 MG tablet Take 2 tablets by mouth at bedtime. 60 tablet 1    Allergies as of 10/05/2022   (No Known Allergies)    Family History  Problem Relation Age of Onset   Anesthesia problems Neg Hx    Hypotension Neg Hx    Malignant hyperthermia Neg Hx    Pseudochol deficiency Neg Hx     Social History   Socioeconomic History   Marital status: Widowed    Spouse name: Not on file   Number of children: Not on file   Years of education: Not on file   Highest education level: Not on file  Occupational History   Not on file  Tobacco Use   Smoking status: Every Day    Current packs/day: 1.00    Average packs/day: 1 pack/day for 50.0 years (50.0 ttl pk-yrs)    Types: Cigarettes   Smokeless tobacco: Never  Substance and Sexual Activity   Alcohol use: No   Drug use: No   Sexual activity: Not Currently  Other Topics Concern   Not on file  Social History Narrative   Not on file   Social Determinants of Health   Financial Resource Strain: Low Risk  (08/10/2021)   [facility]   Overall Financial Resource Strain (CARDIA)    Difficulty of Paying Living Expenses: Not very hard  Food Insecurity: No Food Insecurity (03/05/2022)   Hunger Vital Sign    Worried About Running Out of Food in the Last Year: Never true    Ran Out of Food in the Last Year: Never true  Transportation Needs: No Transportation Needs (03/05/2022)   PRAPARE - Administrator, Civil Service (Medical): No    Lack of Transportation (Non-Medical): No  Physical Activity: Not on file  Stress: Not on file  Social Connections: Not on file  Intimate Partner  Violence: Not At Risk (03/05/2022)   Humiliation, Afraid, Rape, and Kick questionnaire    Fear of Current or Ex-Partner: No    Emotionally Abused: No    Physically Abused: No    Sexually Abused: No     Review of Systems   Gen: Denies any fever, chills, loss of appetite, change in weight  or weight loss CV: Denies chest pain, heart palpitations, syncope, edema  Resp: Denies shortness of breath with rest, cough, wheezing, coughing up blood, and pleurisy. GI: See HPI GU : Denies urinary burning, blood in urine, urinary frequency, and urinary incontinence. MS: Denies joint pain, limitation of movement, swelling, cramps, and atrophy.  Derm: Denies rash, itching, dry skin, hives. Psych: + Confusion.  Denies depression, anxiety, memory loss, hallucinations Heme: Denies bruising or bleeding Neuro:  Denies any headaches, dizziness, paresthesias, shaking  Physical Exam   Vital Signs in last 24 hours: Temp:  [97.7 F (36.5 C)-99.4 F (37.4 C)] 98.6 F (37 C) (08/30 0500) Pulse Rate:  [80-98] 89 (08/30 0700) Resp:  [14-25] 22 (08/30 0700) BP: (148-186)/(66-108) 186/74 (08/30 0700) SpO2:  [91 %-100 %] 98 % (08/30 0700) Weight:  [62.9 kg-68 kg] 68 kg (08/29 2035) Last BM Date : 10/06/22  General:   Alert, frail.  Pleasantly confused. NAD Head:  Normocephalic and atraumatic. Eyes:  Sclera clear, no icterus.   Conjunctiva pink. Ears:  Normal auditory acuity. Mouth: Poor dentition. Neck:  Supple; no masses Lungs:  Clear throughout to auscultation.   No wheezes, crackles, or rhonchi. No acute distress. Heart:  Regular rate and rhythm; no murmurs, clicks, rubs,  or gallops. Abdomen:  Soft, nontender and nondistended. No masses, hepatosplenomegaly or hernias noted. Normal bowel sounds, without guarding, and without rebound.   Rectal: deferred   Msk:  Symmetrical without gross deformities. Normal posture. Extremities:  Without clubbing or edema. Neurologic:  Alert and oriented to self.  Disoriented to time and place. Skin:  Intact without significant lesions or rashes.  Psych:  Alert and cooperative. Normal mood and affect.  Intake/Output from previous day: 08/29 0701 - 08/30 0700 In: 598 [IV Piggyback:598] Out: 325 [Urine:325] Intake/Output this shift: No intake/output data  recorded.   Labs/Studies   Recent Labs Recent Labs    10/05/22 1952 10/06/22 0538  WBC 5.8 8.5  HGB 4.5* 7.8*  HCT 15.0* 24.7*  PLT 99* 93*   BMET Recent Labs    10/05/22 1952 10/06/22 0538  NA 141 142  K 4.2 4.1  CL 113* 117*  CO2 19* 18*  GLUCOSE 99 108*  BUN 65* 61*  CREATININE 4.73* 4.45*  CALCIUM 8.2* 8.2*   LFT Recent Labs    10/06/22 0538  PROT 6.0*  ALBUMIN 2.9*  AST 20  ALT 14  ALKPHOS 50  BILITOT 1.3*   PT/INR No results for input(s): "LABPROT", "INR" in the last 72 hours. Hepatitis Panel No results for input(s): "HEPBSAG", "HCVAB", "HEPAIGM", "HEPBIGM" in the last 72 hours. C-Diff No results for input(s): "CDIFFTOX" in the last 72 hours.  Radiology/Studies CT ABDOMEN PELVIS WO CONTRAST  Result Date: 10/06/2022 CLINICAL DATA:  Abdominal pain.  Concern for kidney stone. EXAM: CT ABDOMEN AND PELVIS WITHOUT CONTRAST TECHNIQUE: Multidetector CT imaging of the abdomen and pelvis was performed following the standard protocol without IV contrast. RADIATION DOSE REDUCTION: This exam was performed according to the departmental dose-optimization program which includes automated exposure control, adjustment of the mA and/or kV according to patient size and/or use of iterative reconstruction technique. COMPARISON:  CT abdomen pelvis dated 03/05/2022. FINDINGS: Evaluation of this exam is limited in the absence of intravenous contrast. Lower chest: Partially visualized small right and trace left pleural effusions. There is diffuse interstitial and interlobular septal prominence of the visualized lungs consistent with edema. There is hypoattenuation of the cardiac blood pool suggestive of anemia. Clinical correlation is recommended. Coronary vascular calcification. No intra-abdominal free air or free fluid. Hepatobiliary: The liver is unremarkable. The gallbladder is unremarkable. Pancreas: Unremarkable. No pancreatic ductal dilatation or surrounding inflammatory changes.  Spleen: Normal in size without focal abnormality. Adrenals/Urinary Tract: Bilateral adrenal thickening/hyperplasia or adenoma as seen on the prior CT. Bilateral renal vascular calcifications. Several small nonobstructing renal calculi may be present. There is no hydronephrosis or obstructing stone on either side. The visualized ureters appear unremarkable. There is a small left posterior bladder wall diverticulum. Mild haziness of the bladder wall. Correlation with urinalysis recommended to exclude UTI. Stomach/Bowel: There is no bowel obstruction or active inflammation. The appendix is normal. Vascular/Lymphatic: Advanced aortoiliac atherosclerotic disease. There is a 5 cm fusiform infrarenal abdominal aortic aneurysm. The IVC is unremarkable. No portal venous gas. There is no adenopathy. Reproductive: The prostate and seminal vesicles are grossly unremarkable. No pelvic mass. Other: None Musculoskeletal: There is compression fracture of superior endplate of L1 with approximately 50% loss of vertebral body height and anterior wedging, new since the prior CT. Correlation with clinical exam and point tenderness recommended. There is approximately 7 mm retropulsion of the posterosuperior cortex with mild focal narrowing of the central canal. IMPRESSION: 1. No hydronephrosis or obstructing stone. 2. Mild haziness of the bladder wall. Correlation with urinalysis recommended to exclude UTI. 3. No bowel obstruction. Normal appendix. 4. A 5 cm fusiform infrarenal abdominal aortic aneurysm. Recommend follow-up CT/MR every 6 months and vascular consultation. This recommendation follows ACR consensus guidelines: White Paper of the ACR Incidental Findings Committee II on Vascular Findings. J Am Coll Radiol 2013; 10:789-794. 5. Compression fracture of superior endplate of L1, new since the prior CT. Correlation with clinical exam and point tenderness recommended. 6. Partially visualized small right and trace left pleural  effusions. 7.  Aortic Atherosclerosis (ICD10-I70.0). Electronically Signed   By: Elgie Collard M.D.   On: 10/06/2022 00:00   DG Chest Port 1 View  Result Date: 10/05/2022 CLINICAL DATA:  Anemia. Patient was found on the floor. Generalized weakness. Decreased hemoglobin. History of heart disease, hypertension, diabetes, tobacco abuse. EXAM: PORTABLE CHEST 1 VIEW COMPARISON:  03/05/2022 FINDINGS: Cardiac enlargement with pulmonary vascular congestion. Perihilar and basilar interstitial changes likely represent mild edema. No focal consolidation. No pleural effusions. No pneumothorax. Mediastinal contours appear intact. Calcification of the aorta. IMPRESSION: Cardiac enlargement with pulmonary vascular congestion and mild interstitial edema. Electronically Signed   By: Burman Nieves M.D.   On: 10/05/2022 23:22     Assessment   Isaac Hunter is a 82 y.o. year old male CAD, diabetes, HTN, tobacco abuse, TURP in 2012, and ?CKD (baseline creatinine 2.8-3) who presented to the ED after a fall at home and unintended for few hours.  Labs revealed significant anemia and heme positive stool and GI consulted for further evaluation.  Normocytic anemia, heme positive stool: Presented with hemoglobin of 4.5 with normocytic indices.  Stool heme positive.  He has received 2 units PRBCs with improvement of hemoglobin to 7.8 today.  Anemia panel obtained this morning with iron, saturation, and ferritin within normal limits however this was obtained this morning after he has received blood products.  Etiology unclear at this time however given elevated BUN despite possible elevation at baseline (CKD and CHF?) and heme positive stool there is concern for upper GI bleed therefore would like to proceed with EGD today for further evaluation.  Differential broad including esophagitis, gastritis, duodenitis, peptic ulcer disease, AVM, and malignancy.  AKI on CKD, UTI: Appears patient's baseline creatinine is around  2.6-3 with baseline GFR 18-23.  Currently presented with creatinine of 4.73 with mild improvement this morning to 4.45.  Likely increased given UTI. Management per hospitalist.  Receiving Rocephin for UTI.  Also receiving maintenance IV fluids.  Plan / Recommendations   NPO PPI IV BID EGD today with Dr. Levon Hedger Trend H/H, transfuse Hgb <7     10/06/2022, 8:39 AM  Brooke Bonito, MSN, FNP-BC, AGACNP-BC [facility]

## 2022-10-06 NOTE — Brief Op Note (Signed)
10/06/2022  3:41 PM  PATIENT:  Isaac Hunter  82 y.o. male  PRE-OPERATIVE DIAGNOSIS:  anemia, heme positive stool  POST-OPERATIVE DIAGNOSIS:  gastritis; duodenal nodules x2; gastric biopsies;  PROCEDURE:  Procedure(s): ESOPHAGOGASTRODUODENOSCOPY (EGD) WITH PROPOFOL (N/A) BIOPSY  SURGEON:  Surgeons and Role:    * Dolores Frame, MD - Primary  Patient underwent EGD under propofol sedation.  Tolerated the procedure adequately.   FINDINGS: - Normal esophagus.  - Gastritis.  Biopsied.  - Normal examined duodenum.   RECOMMENDATIONS - Clear liquid diet today.  - Check daily H/H - Await pathology results.  - Return patient to hospital ward for ongoing care.   Katrinka Blazing, MD Gastroenterology and Hepatology [facility]

## 2022-10-06 NOTE — Anesthesia Postprocedure Evaluation (Signed)
Anesthesia Post Note  Patient: Isaac Hunter  Procedure(s) Performed: ESOPHAGOGASTRODUODENOSCOPY (EGD) WITH PROPOFOL BIOPSY  Patient location during evaluation: Phase II Anesthesia Type: General Level of consciousness: awake Pain management: pain level controlled Vital Signs Assessment: post-procedure vital signs reviewed and stable Respiratory status: spontaneous breathing and respiratory function stable Cardiovascular status: blood pressure returned to baseline and stable Postop Assessment: no headache and no apparent nausea or vomiting Anesthetic complications: no Comments: Late entry   No notable events documented.   Last Vitals:  Vitals:   10/06/22 1558 10/06/22 1600  BP:  (!) 140/62  Pulse: 65 65  Resp:  11  Temp:    SpO2: 96% 97%    Last Pain:  Vitals:   10/06/22 1550  TempSrc:   PainSc: 0-No pain                 Windell Norfolk

## 2022-10-06 NOTE — ED Notes (Signed)
Consulted with [facility] regarding pt's IV infiltration of rocephin. Continue to monitor site and call back with any further concerns. Will continue to monitor pt for any complications.

## 2022-10-06 NOTE — Anesthesia Preprocedure Evaluation (Signed)
Anesthesia Evaluation  Patient identified by MRN, date of birth, ID band Patient awake    Reviewed: Allergy & Precautions, H&P , NPO status , Patient's Chart, lab work & pertinent test results, reviewed documented beta blocker date and time   Airway Mallampati: II  TM Distance: >3 FB Neck ROM: full    Dental no notable dental hx.    Pulmonary neg pulmonary ROS, Current Smoker   Pulmonary exam normal breath sounds clear to auscultation       Cardiovascular Exercise Tolerance: Good hypertension, + CAD  negative cardio ROS  Rhythm:regular Rate:Normal     Neuro/Psych negative neurological ROS  negative psych ROS   GI/Hepatic negative GI ROS, Neg liver ROS,,,  Endo/Other  negative endocrine ROSdiabetes    Renal/GU Renal diseasenegative Renal ROS  negative genitourinary   Musculoskeletal   Abdominal   Peds  Hematology negative hematology ROS (+) Blood dyscrasia, anemia   Anesthesia Other Findings   Reproductive/Obstetrics negative OB ROS                             Anesthesia Physical Anesthesia Plan  ASA: 4 and emergent  Anesthesia Plan: General   Post-op Pain Management:    Induction:   PONV Risk Score and Plan: Propofol infusion  Airway Management Planned:   Additional Equipment:   Intra-op Plan:   Post-operative Plan:   Informed Consent: I have reviewed the patients History and Physical, chart, labs and discussed the procedure including the risks, benefits and alternatives for the proposed anesthesia with the patient or authorized representative who has indicated his/her understanding and acceptance.     Dental Advisory Given  Plan Discussed with: CRNA  Anesthesia Plan Comments:        Anesthesia Quick Evaluation

## 2022-10-06 NOTE — Progress Notes (Signed)
   10/06/22 1602  TOC Brief Assessment  Insurance and Status Reviewed  Patient has primary care physician Yes  Home environment has been reviewed reveiwed  Prior level of function: independent  Prior/Current Home Services No current home services  Social Determinants of Health Reivew SDOH reviewed no interventions necessary  Readmission risk has been reviewed Yes  Transition of care needs no transition of care needs at this time   [facility] Screen Transition of Care Department [facility] has reviewed patient and no TOC needs have been identified at this time. We will continue to monitor patient advancement through interdisciplinary progression rounds. If new patient transition needs arise, please place a TOC consult.

## 2022-10-06 NOTE — H&P (Signed)
History and Physical    Patient: Isaac Hunter UJW:[id number] DOB: 04-28-1940 DOA: 10/05/2022 DOS: the patient was seen and examined on 10/06/2022 PCP: [facility]  Patient coming from: Home  Chief Complaint:  Chief Complaint  Patient presents with   Fall   HPI: Isaac Hunter is a 82 y.o. male with medical history significant of coronary artery disease, diabetes mellitus type 2, hypertension, tobacco use disorder, presents to the ED with a chief complaint of fall.  Fortunately patient does not remember why he is in the hospital.  I asked him he just says he forgot what they told him.  I asked if he was feeling dizzy or had any falls and he said he did not think so.  According to chart review family went to check on patient and found him on the ground.  He had slid out of his chair and could not get up.  He was down for couple hours but his CPK is normal.  He denies any loss of consciousness.  He does have malodorous urine, and it was recorded that he had had some urinary incontinence while he was on the ground.  Patient tells me he is not in any pain right now.  He does not think he has been bleeding.  I did not ask further about melena and hematochezia because it is clear that he has confused on the details. Patient has no other complaints at this time.  He does report he smokes a pack and half a day and would like a nicotine patch.  He reports he does not drink. Review of Systems: unable to review all systems due to the inability of the patient to answer questions. Past Medical History:  Diagnosis Date   CAD (coronary artery disease)    Non obstructive per cath 2001.   Diabetes mellitus    hx per pt, no longer has?   Hypertension    hx per pt, no long has??   Tobacco abuse 11/17/2010   Past Surgical History:  Procedure Laterality Date   CYSTOSCOPY  11/21/2010   Procedure: CYSTOSCOPY;  Surgeon: Ky Barban;  Location: [facility];  Service: Urology;   Laterality: N/A;   Left hand surgery     TRANSURETHRAL RESECTION OF BLADDER TUMOR  11/21/2010   Procedure: TRANSURETHRAL RESECTION OF BLADDER TUMOR (TURBT);  Surgeon: Ky Barban;  Location: [facility];  Service: Urology;  Laterality: N/A;  evacuation of clots   Social History:  reports that he has been smoking cigarettes. He has a 50 pack-year smoking history. He has never used smokeless tobacco. He reports that he does not drink alcohol and does not use drugs.  No Known Allergies  Family History  Problem Relation Age of Onset   Anesthesia problems Neg Hx    Hypotension Neg Hx    Malignant hyperthermia Neg Hx    Pseudochol deficiency Neg Hx     Prior to Admission medications   Medication Sig Start Date End Date Taking? Authorizing Provider  aspirin EC 81 MG tablet Take 1 tablet (81 mg total) by mouth daily with breakfast. 03/07/22  Yes Emokpae, Courage, MD  atorvastatin (LIPITOR) 80 MG tablet Take 1 tablet (80 mg total) by mouth daily. 09/12/21  Yes Emokpae, Courage, MD  chlorthalidone (HYGROTON) 25 MG tablet Take 25 mg by mouth daily. 08/08/19  Yes [provider]  furosemide (LASIX) 20 MG tablet Take 1 tablet (20 mg total) by mouth every morning. 03/07/22  Yes Emokpae,  Courage, MD  acetaminophen (TYLENOL) 325 MG tablet Take 2 tablets (650 mg total) by mouth every 6 (six) hours as needed for up to 30 doses for mild pain. 12/07/19   Terald Sleeper, MD  amLODipine (NORVASC) 10 MG tablet Take 1 tablet (10 mg total) by mouth daily. For BP 03/07/22 03/07/23  Shon Hale, MD  carvedilol (COREG) 6.25 MG tablet Take 1 tablet (6.25 mg total) by mouth 2 (two) times daily with a meal. For BP 03/07/22   Emokpae, Courage, MD  cephALEXin (KEFLEX) 500 MG capsule Take 500 mg by mouth 4 (four) times daily. Patient not taking: Reported on 10/05/2022 05/16/22   [provider]  Cholecalciferol 25 MCG (1000 UT) tablet Take 2,000 Units by mouth daily.     [provider]  FEROSUL  325 (65 Fe) MG tablet Take 1 tablet (325 mg total) by mouth daily with breakfast. 03/07/22   Mariea Clonts, Courage, MD  NIFEdipine (ADALAT CC) 30 MG 24 hr tablet Take 1 tablet by mouth daily. 08/08/19   [provider]  nitrofurantoin, macrocrystal-monohydrate, (MACROBID) 100 MG capsule Take 100 mg by mouth 2 (two) times daily. Patient not taking: Reported on 10/05/2022 05/18/22   [provider]  quinapril (ACCUPRIL) 40 MG tablet Take 20 mg by mouth daily. Patient not taking: Reported on 10/05/2022 12/19/19   [provider]  senna-docusate (SENOKOT-S) 8.6-50 MG tablet Take 2 tablets by mouth at bedtime. 03/07/22   Shon Hale, MD    Physical Exam: Vitals:   10/06/22 0301 10/06/22 0430 10/06/22 0500 10/06/22 0605  BP: (!) 180/77 (!) 176/78 (!) 178/79 (!) 174/84  Pulse: 88 92 90 90  Resp: 20 (!) 24 20 20   Temp: 98.8 F (37.1 C)  98.6 F (37 C)   TempSrc: Oral  Oral   SpO2: 97% 98% 97% 99%  Weight:      Height:       1.  General: Patient lying supine in bed,  no acute distress   2. Psychiatric: Alert and oriented x self, mood and behavior normal for situation, pleasant and cooperative with exam   3. Neurologic: Speech and language are normal, face is symmetric, moves all 4 extremities voluntarily, at baseline without acute deficits on limited exam   4. HEENMT:  Head is atraumatic, normocephalic, pupils reactive to light, neck is supple, trachea is midline, mucous membranes are moist   5. Respiratory : Lungs are clear to auscultation bilaterally without wheezing, rhonchi, rales, no cyanosis, no increase in work of breathing or accessory muscle use   6. Cardiovascular : Heart rate normal, rhythm is regular, no murmurs, rubs or gallops, no peripheral edema, peripheral pulses palpated   7. Gastrointestinal:  Abdomen is soft, nondistended, nontender to palpation bowel sounds active, no masses or organomegaly palpated   8. Skin:  Skin is warm, dry and intact  without rashes, acute lesions, or ulcers on limited exam   9.Musculoskeletal:  No acute deformities or trauma, no asymmetry in tone, no peripheral edema, peripheral pulses palpated, no tenderness to palpation in the extremities  Data Reviewed: In the ED Temp 97.7-29.4, heart rate 80-98, respiratory 14-24, blood pressure 149/66-170s over 108, satting 91-100% No leukocytosis Hemoglobin 4.5 Chemistry reveals an AKI FOBT positive CT abdomen pelvis shows no obstructing stone but it does show signs of UTI Chest x-ray shows cardiomegaly with interstitial edema  Assessment and Plan: Blood loss anemia - Hemoglobin 4.5 - FOBT positive - Consult GI - Continue Protonix - Transfuse as indicated -  Continue to monitor  UTI - UA indicative of UTI - Urine culture pending - Continue Rocephin  AKI - Creatinine at baseline 2.67 - Creatinine today 4.73 - Hold nephrotoxic agents including chlorthalidone and ACE - Continue to monitor  Hyperlipidemia - Continue statin - Hypertension - Continue Norvasc and Coreg    Advance Care Planning:   Code Status: Full Code (no DNR at bedside)  Consults: GI  Family Communication: No family at bedside  Severity of Illness: The appropriate patient status for this patient is INPATIENT. Inpatient status is judged to be reasonable and necessary in order to provide the required intensity of service to ensure the patient's safety. The patient's presenting symptoms, physical exam findings, and initial radiographic and laboratory data in the context of their chronic comorbidities is felt to place them at high risk for further clinical deterioration. Furthermore, it is not anticipated that the patient will be medically stable for discharge from the hospital within 2 midnights of admission.   * I certify that at the point of admission it is my clinical judgment that the patient will require inpatient hospital care spanning beyond 2 midnights from the point of  admission due to high intensity of service, high risk for further deterioration and high frequency of surveillance required.*  Author: Lilyan Gilford, DO 10/06/2022 7:03 AM  For on call review [email or web address].

## 2022-10-06 NOTE — Op Note (Signed)
[facility] Patient Name: Isaac Hunter Procedure Date: 10/06/2022 2:53 PM MRN: [id number] Date of Birth: December 16, 1940 Attending MD: Katrinka Blazing , , [id number] CSN: [id number] Age: 82 Admit Type: Inpatient Procedure:                Upper GI endoscopy Indications:              Iron deficiency anemia Providers:                Katrinka Blazing, Crystal Page, Dyann Ruddle,                            Elinor Parkinson Referring MD:              Medicines:                Monitored Anesthesia Care Complications:            No immediate complications. Estimated Blood Loss:     Estimated blood loss: none. Procedure:                Pre-Anesthesia Assessment:                           - Prior to the procedure, a History and Physical                            was performed, and patient medications, allergies                            and sensitivities were reviewed. The patient's                            tolerance of previous anesthesia was reviewed.                           - The risks and benefits of the procedure and the                            sedation options and risks were discussed with the                            patient. All questions were answered and informed                            consent was obtained.                           - ASA Grade Assessment: III - A patient with severe                            systemic disease.                           After obtaining informed consent, the endoscope was                            passed under direct vision. Throughout the  procedure, the patient's blood pressure, pulse, and                            oxygen saturations were monitored continuously. The                            [id number]-H190 ([id number]) scope was introduced through the                            mouth, and advanced to the second part of duodenum.                            The upper GI endoscopy was accomplished without                             difficulty. The patient tolerated the procedure                            well. Scope In: 3:21:50 PM Scope Out: 3:28:02 PM Total Procedure Duration: 0 hours 6 minutes 12 seconds  Findings:      The examined esophagus was normal.      Scattered moderate inflammation characterized by congestion (edema) and       erythema was found on the posterior wall of the stomach. Biopsies were       taken with a cold forceps for Helicobacter pylori testing.      The examined duodenum was normal. Impression:               - Normal esophagus.                           - Gastritis. Biopsied.                           - Normal examined duodenum. Moderate Sedation:      Per Anesthesia Care Recommendation:           - Clear liquid diet today.                           - Check daily H/H                           - Await pathology results.                           - Return patient to hospital ward for ongoing care. Procedure Code(s):        --- Professional ---                           [phone], Esophagogastroduodenoscopy, flexible,                            transoral; with biopsy, single or multiple Diagnosis Code(s):        --- Professional ---  K29.70, Gastritis, unspecified, without bleeding                           D50.9, Iron deficiency anemia, unspecified CPT copyright 2022 American Medical Association. All rights reserved. The codes documented in this report are preliminary and upon coder review may  be revised to meet current compliance requirements. Katrinka Blazing, MD Katrinka Blazing,  10/06/2022 3:42:10 PM This report has been signed electronically. Number of Addenda: 0

## 2022-10-07 DIAGNOSIS — N179 Acute kidney failure, unspecified: Secondary | ICD-10-CM

## 2022-10-07 DIAGNOSIS — W19XXXA Unspecified fall, initial encounter: Secondary | ICD-10-CM | POA: Diagnosis not present

## 2022-10-07 DIAGNOSIS — D649 Anemia, unspecified: Secondary | ICD-10-CM | POA: Diagnosis not present

## 2022-10-07 DIAGNOSIS — D500 Iron deficiency anemia secondary to blood loss (chronic): Secondary | ICD-10-CM | POA: Diagnosis not present

## 2022-10-07 LAB — CBC
HCT: 23.2 % — ABNORMAL LOW (ref 39.0–52.0)
Hemoglobin: 7.1 g/dL — ABNORMAL LOW (ref 13.0–17.0)
MCH: 25.4 pg — ABNORMAL LOW (ref 26.0–34.0)
MCHC: 30.6 g/dL (ref 30.0–36.0)
MCV: 82.9 fL (ref 80.0–100.0)
Platelets: 93 10*3/uL — ABNORMAL LOW (ref 150–400)
RBC: 2.8 MIL/uL — ABNORMAL LOW (ref 4.22–5.81)
RDW: 15.5 % (ref 11.5–15.5)
WBC: 7.2 10*3/uL (ref 4.0–10.5)
nRBC: 0 % (ref 0.0–0.2)

## 2022-10-07 LAB — URINE CULTURE

## 2022-10-07 LAB — BASIC METABOLIC PANEL
Anion gap: 8 (ref 5–15)
BUN: 58 mg/dL — ABNORMAL HIGH (ref 8–23)
CO2: 17 mmol/L — ABNORMAL LOW (ref 22–32)
Calcium: 7.9 mg/dL — ABNORMAL LOW (ref 8.9–10.3)
Chloride: 117 mmol/L — ABNORMAL HIGH (ref 98–111)
Creatinine, Ser: 4.03 mg/dL — ABNORMAL HIGH (ref 0.61–1.24)
GFR, Estimated: 14 mL/min — ABNORMAL LOW (ref >60)
Glucose, Bld: 103 mg/dL — ABNORMAL HIGH (ref 70–99)
Potassium: 3.9 mmol/L (ref 3.5–5.1)
Sodium: 142 mmol/L (ref 135–145)

## 2022-10-07 LAB — PREPARE RBC (CROSSMATCH)

## 2022-10-07 MED ORDER — PEG 3350-KCL-NA BICARB-NACL 420 G PO SOLR
4000.0000 mL | Freq: Once | ORAL | Status: AC
  Administered 2022-10-07: 4000 mL via ORAL

## 2022-10-07 MED ORDER — SODIUM CHLORIDE 0.9% IV SOLUTION: Freq: Once | INTRAVENOUS | Status: AC

## 2022-10-07 NOTE — Progress Notes (Signed)
[facility]  PROGRESS NOTE  Isaac Hunter ZOX:[id number] DOB: 06-23-1940 DOA: 10/05/2022 PCP: [facility]   Brief HPI:   Patient admitted this morning, see detailed H&P by Dr Camillo Flaming  82 y.o. male with medical history significant of coronary artery disease, diabetes mellitus type 2, hypertension, tobacco use disorder, presents to the ED with a chief complaint of fall.  In the ED found to have Hb 4.5, FOBT positive. S/p 2 units PRBC    Assessment/Plan:   Blood loss anemia -Presented with hemoglobin of 4.5, FOBT positive -Status post 2 units PRBC -Hemoglobin improved to 7.1 -Will transfuse 1 more unit today -Follow CBC in a.m.   GI bleed -Underwent EGD which was unremarkable -Found to have gastritis which was biopsied -Discussed with gastroenterology, plan for colonoscopy in a.m. -Will keep him n.p.o. after midnight  Acute kidney injury on CKD stage IV -Baseline creatinine 2.67, presented with creatinine of 4.73 -Improved to 4.03 with IV fluids -Getting 1 unit PRBC as above -Follow renal function in a.m.    UTI -UA suggestive of UTI -Started on Rocephin -Urine culture grew mixed flora -Will discontinue Rocephin -Follow urine culture results   Hypertension -Continue Norvasc, Coreg    Medications     sodium chloride   Intravenous Once   amLODipine  10 mg Oral Daily   atorvastatin  80 mg Oral Daily   carvedilol  6.25 mg Oral BID WC   nicotine  21 mg Transdermal Daily   pantoprazole (PROTONIX) IV  40 mg Intravenous Q12H   polyethylene glycol-electrolytes  4,000 mL Oral Once     Data Reviewed:   CBG:  No results for input(s): "GLUCAP" in the last 168 hours.  SpO2: 100 % O2 Flow Rate (L/min): 2 L/min    Vitals:   10/06/22 1724 10/06/22 2131 10/07/22 0017 10/07/22 0348  BP: (!) 124/59 126/75 126/72 (!) 142/69  Pulse: 63 66 79 71  Resp: 16 20 19 19   Temp: 97.7 F (36.5 C) 97.7 F (36.5 C) 98 F (36.7 C) 97.9 F (36.6 C)   TempSrc: Oral Oral  Oral  SpO2: 100% 100% 100% 100%  Weight:      Height:          Data Reviewed:  Basic Metabolic Panel: Recent Labs  Lab 10/05/22 1952 10/06/22 0538 10/07/22 0514  NA 141 142 142  K 4.2 4.1 3.9  CL 113* 117* 117*  CO2 19* 18* 17*  GLUCOSE 99 108* 103*  BUN 65* 61* 58*  CREATININE 4.73* 4.45* 4.03*  CALCIUM 8.2* 8.2* 7.9*  MG 2.2 2.1  --     CBC: Recent Labs  Lab 10/05/22 1952 10/06/22 0538 10/06/22 1103 10/07/22 0514  WBC 5.8 8.5 10.1 7.2  NEUTROABS 4.0  --   --   --   HGB 4.5* 7.8* 7.4* 7.1*  HCT 15.0* 24.7* 23.4* 23.2*  MCV 80.2 81.5 81.5 82.9  PLT 99* 93* 92* 93*    LFT Recent Labs  Lab 10/06/22 0538  AST 20  ALT 14  ALKPHOS 50  BILITOT 1.3*  PROT 6.0*  ALBUMIN 2.9*     Antibiotics: Anti-infectives (From admission, onward)    Start     Dose/Rate Route Frequency Ordered Stop   10/06/22 2300  cefTRIAXone (ROCEPHIN) 1 g in sodium chloride 0.9 % 100 mL IVPB        1 g 200 mL/hr over 30 Minutes Intravenous Every 24 hours 10/06/22 0045     10/05/22 2230  cefTRIAXone (ROCEPHIN) 1 g in sodium chloride 0.9 % 100 mL IVPB        1 g 200 mL/hr over 30 Minutes Intravenous  Once 10/05/22 2216 10/05/22 2346        DVT prophylaxis: SCDs  Code Status: Full code  Family Communication: No family at bedside   CONSULTS gastroenterology   Subjective   Denies abdominal pain.  Underwent EGD which showed gastritis.  Hemoglobin is 7.1 this morning   Objective    Physical Examination:   General-appears in no acute distress Heart-S1-S2, regular, no murmur auscultated Lungs-clear to auscultation bilaterally, no wheezing or crackles auscultated Abdomen-soft, nontender, no organomegaly Extremities-no edema in the lower extremities Neuro-alert, oriented x3, no focal deficit noted   Status is: Inpatient:             Meredeth Ide   [facility] If 7PM-7AM, please contact night-coverage at [email or web address], Office   [phone]   10/07/2022, 8:55 AM  LOS: 1 day

## 2022-10-07 NOTE — Progress Notes (Signed)
Patient completed the first 15 minutes of blood transfusion with no signs or symptoms of reaction. Vital signs: T-97.7, P-72, R-20, BP-165/79, O2-100 RA. MD [place] made aware. No new orders.

## 2022-10-07 NOTE — Progress Notes (Signed)
Patient alert and oriented x 3 during shift. Patient verbalized no complaints of pain or discomfort. Nulytely started at 1420 today, patient stated he was not able to drink that much during shift, continued to encourage patient due to patient having colonoscopy tomorrow. MD Sharl Ma and MD Levon Hedger made aware. No new orders.

## 2022-10-07 NOTE — Progress Notes (Signed)
Katrinka Blazing, M.D. Gastroenterology & Hepatology   Interval History:  Patient reports feeling well, denies any complaints such as nausea, vomiting, fever, chills, melena or hematochezia. Hemoglobin dropped down to 7.1 today.  Inpatient Medications:  Current Facility-Administered Medications:    0.9 %  sodium chloride infusion, , Intravenous, Continuous, Zierle-Ghosh, Asia B, DO, Last Rate: 100 mL/hr at 10/07/22 0457, New Bag at 10/07/22 0457   acetaminophen (TYLENOL) tablet 650 mg, 650 mg, Oral, Q6H PRN **OR** acetaminophen (TYLENOL) suppository 650 mg, 650 mg, Rectal, Q6H PRN, Zierle-Ghosh, Asia B, DO   amLODipine (NORVASC) tablet 10 mg, 10 mg, Oral, Daily, Zierle-Ghosh, Asia B, DO, 10 mg at 10/07/22 1019   atorvastatin (LIPITOR) tablet 80 mg, 80 mg, Oral, Daily, Zierle-Ghosh, Asia B, DO, 80 mg at 10/07/22 1019   carvedilol (COREG) tablet 6.25 mg, 6.25 mg, Oral, BID WC, Zierle-Ghosh, Asia B, DO, 6.25 mg at 10/07/22 0819   cefTRIAXone (ROCEPHIN) 1 g in sodium chloride 0.9 % 100 mL IVPB, 1 g, Intravenous, Q24H, Zierle-Ghosh, Asia B, DO, Last Rate: 200 mL/hr at 10/06/22 2352, 1 g at 10/06/22 2352   hydrALAZINE (APRESOLINE) injection 10 mg, 10 mg, Intravenous, Q4H PRN, Sharl Ma, Sarina Ill, MD   morphine (PF) 2 MG/ML injection 2 mg, 2 mg, Intravenous, Q2H PRN, Zierle-Ghosh, Asia B, DO   nicotine (NICODERM CQ - dosed in mg/24 hours) patch 21 mg, 21 mg, Transdermal, Daily, Zierle-Ghosh, Asia B, DO, 21 mg at 10/07/22 1027   ondansetron (ZOFRAN) tablet 4 mg, 4 mg, Oral, Q6H PRN **OR** ondansetron (ZOFRAN) injection 4 mg, 4 mg, Intravenous, Q6H PRN, Zierle-Ghosh, Asia B, DO   oxyCODONE (Oxy IR/ROXICODONE) immediate release tablet 5 mg, 5 mg, Oral, Q4H PRN, Zierle-Ghosh, Asia B, DO   pantoprazole (PROTONIX) injection 40 mg, 40 mg, Intravenous, Q12H, Zierle-Ghosh, Asia B, DO, 40 mg at 10/07/22 1019   polyethylene glycol-electrolytes (NuLYTELY) solution 4,000 mL, 4,000 mL, Oral, Once, Marguerita Merles,  Reuel Boom, MD   I/O    Intake/Output Summary (Last 24 hours) at 10/07/2022 1053 Last data filed at 10/07/2022 0800 Gross per 24 hour  Intake 2594.75 ml  Output 1000 ml  Net 1594.75 ml     Physical Exam: Temp:  [97.7 F (36.5 C)-98 F (36.7 C)] 97.7 F (36.5 C) (08/31 1044) Pulse Rate:  [58-79] 72 (08/31 1044) Resp:  [10-20] 20 (08/31 1044) BP: (111-165)/(55-79) 165/79 (08/31 1044) SpO2:  [94 %-100 %] 100 % (08/31 1044)  Temp (24hrs), Avg:97.8 F (36.6 C), Min:97.7 F (36.5 C), Max:98 F (36.7 C) GENERAL: The patient is AO x3, in no acute distress. Poor historian. HEENT: Head is normocephalic and atraumatic. EOMI are intact. Mouth is well hydrated and without lesions. NECK: Supple. No masses LUNGS: Clear to auscultation. No presence of rhonchi/wheezing/rales. Adequate chest expansion HEART: RRR, normal s1 and s2. ABDOMEN: Soft, nontender, no guarding, no peritoneal signs, and nondistended. BS +. No masses. EXTREMITIES: Without any cyanosis, clubbing, rash, lesions or edema. NEUROLOGIC: AOx3, no focal motor deficit. SKIN: no jaundice, no rashes  Laboratory Data: CBC:     Component Value Date/Time   WBC 7.2 10/07/2022 0514   RBC 2.80 (L) 10/07/2022 0514   HGB 7.1 (L) 10/07/2022 0514   HCT 23.2 (L) 10/07/2022 0514   PLT 93 (L) 10/07/2022 0514   MCV 82.9 10/07/2022 0514   MCH 25.4 (L) 10/07/2022 0514   MCHC 30.6 10/07/2022 0514   RDW 15.5 10/07/2022 0514   LYMPHSABS 0.9 10/05/2022 1952   MONOABS 0.7 10/05/2022 1952   EOSABS  0.1 10/05/2022 1952   BASOSABS 0.0 10/05/2022 1952   COAG:  Lab Results  Component Value Date   INR 1.2 03/05/2022   INR 1.2 08/31/2021   INR 1.19 11/17/2010    BMP:     Latest Ref Rng & Units 10/07/2022    5:14 AM 10/06/2022    5:38 AM 10/05/2022    7:52 PM  BMP  Glucose 70 - 99 mg/dL 376  283  99   BUN 8 - 23 mg/dL 58  61  65   Creatinine 0.61 - 1.24 mg/dL 1.51  7.61  6.07   Sodium 135 - 145 mmol/L 142  142  141   Potassium 3.5 - 5.1  mmol/L 3.9  4.1  4.2   Chloride 98 - 111 mmol/L 117  117  113   CO2 22 - 32 mmol/L 17  18  19    Calcium 8.9 - 10.3 mg/dL 7.9  8.2  8.2     HEPATIC:     Latest Ref Rng & Units 10/06/2022    5:38 AM 03/06/2022    4:46 AM 03/05/2022    2:40 AM  Hepatic Function  Total Protein 6.5 - 8.1 g/dL 6.0  6.0  7.2   Albumin 3.5 - 5.0 g/dL 2.9  2.8  3.7   AST 15 - 41 U/L 20  22  27    ALT 0 - 44 U/L 14  14  15    Alk Phosphatase 38 - 126 U/L 50  53  59   Total Bilirubin 0.3 - 1.2 mg/dL 1.3  0.5  0.9     CARDIAC:  Lab Results  Component Value Date   CKTOTAL 272 10/05/2022      Imaging: I personally reviewed and interpreted the available labs, imaging and endoscopic files.   Assessment/Plan: 82 y.o. year old male with history of CAD, diabetes, HTN, tobacco abuse, and ?CKD, who comes for evaluation of anemia and heme positive stool.  The patient has not presented any overt gastrointestinal bleeding but had severe anemia (hemoglobin 4.5) that required transfusion upon admission.  Has history of iron deficiency in the past.  Due to this, he underwent an EGD yesterday that only showed gastritis in the posterior wall of the stomach but no source of bleeding could be found.  As he has presented worsening anemia today, will need to proceed with colonoscopy tomorrow.  He will benefit from receiving 1 more unit of blood as his hemoglobin is borderline.  - CLD, NPO after MN -Transfuse 1 unit of blood in preparation for colonoscopy tomorrow -Colonoscopy tomorrow  Katrinka Blazing, MD Gastroenterology and Hepatology [facility]

## 2022-10-08 ENCOUNTER — Encounter (HOSPITAL_COMMUNITY)
Admission: EM | Disposition: A | Discharge status: Home Health | Payer: Self-pay | Source: Home / Self Care | Attending: Family Medicine

## 2022-10-08 ENCOUNTER — Inpatient Hospital Stay (HOSPITAL_COMMUNITY): Payer: 59 | Admitting: Anesthesiology

## 2022-10-08 DIAGNOSIS — N179 Acute kidney failure, unspecified: Secondary | ICD-10-CM | POA: Diagnosis not present

## 2022-10-08 DIAGNOSIS — N3000 Acute cystitis without hematuria: Secondary | ICD-10-CM

## 2022-10-08 DIAGNOSIS — D500 Iron deficiency anemia secondary to blood loss (chronic): Secondary | ICD-10-CM | POA: Diagnosis not present

## 2022-10-08 DIAGNOSIS — W19XXXA Unspecified fall, initial encounter: Secondary | ICD-10-CM | POA: Diagnosis not present

## 2022-10-08 LAB — CBC
HCT: 25.1 % — ABNORMAL LOW (ref 39.0–52.0)
Hemoglobin: 7.9 g/dL — ABNORMAL LOW (ref 13.0–17.0)
MCH: 25.6 pg — ABNORMAL LOW (ref 26.0–34.0)
MCHC: 31.5 g/dL (ref 30.0–36.0)
MCV: 81.2 fL (ref 80.0–100.0)
Platelets: 85 10*3/uL — ABNORMAL LOW (ref 150–400)
RBC: 3.09 MIL/uL — ABNORMAL LOW (ref 4.22–5.81)
RDW: 15.9 % — ABNORMAL HIGH (ref 11.5–15.5)
WBC: 6.2 10*3/uL (ref 4.0–10.5)
nRBC: 0 % (ref 0.0–0.2)

## 2022-10-08 LAB — BPAM RBC
Blood Product Expiration Date: 202409272359
Blood Product Expiration Date: 202410022359
Blood Product Expiration Date: 202410062359
ISSUE DATE / TIME: 202408300036
ISSUE DATE / TIME: 202408300253
ISSUE DATE / TIME: 202408311018
Unit Type and Rh: 1700
Unit Type and Rh: 9500
Unit Type and Rh: 9500

## 2022-10-08 LAB — COMPREHENSIVE METABOLIC PANEL
ALT: 12 U/L (ref 0–44)
AST: 15 U/L (ref 15–41)
Albumin: 2.5 g/dL — ABNORMAL LOW (ref 3.5–5.0)
Alkaline Phosphatase: 43 U/L (ref 38–126)
Anion gap: 6 (ref 5–15)
BUN: 49 mg/dL — ABNORMAL HIGH (ref 8–23)
CO2: 18 mmol/L — ABNORMAL LOW (ref 22–32)
Calcium: 7.6 mg/dL — ABNORMAL LOW (ref 8.9–10.3)
Chloride: 115 mmol/L — ABNORMAL HIGH (ref 98–111)
Creatinine, Ser: 3.76 mg/dL — ABNORMAL HIGH (ref 0.61–1.24)
GFR, Estimated: 15 mL/min — ABNORMAL LOW (ref >60)
Glucose, Bld: 84 mg/dL (ref 70–99)
Potassium: 3.8 mmol/L (ref 3.5–5.1)
Sodium: 139 mmol/L (ref 135–145)
Total Bilirubin: 1 mg/dL (ref 0.3–1.2)
Total Protein: 5.2 g/dL — ABNORMAL LOW (ref 6.5–8.1)

## 2022-10-08 LAB — TYPE AND SCREEN
ABO/RH(D): B NEG
Antibody Screen: NEGATIVE
Unit division: 0
Unit division: 0
Unit division: 0

## 2022-10-08 SURGERY — COLONOSCOPY WITH PROPOFOL
Anesthesia: General

## 2022-10-08 MED ORDER — SODIUM CHLORIDE 0.9 % IV SOLN: INTRAVENOUS | Status: DC

## 2022-10-08 NOTE — Progress Notes (Signed)
Katrinka Blazing, M.D. Gastroenterology & Hepatology   Interval History:  Patient reports feeling well and denies any abdominal pain, nausea, vomiting, fever, chills, melena or hematochezia. Nurse reports that he did not have any bowel movements yesterday but he only drank less than 500 cc of the bowel prep. Was transfused 1 unit PRBC with repeat hemoglobin of 7.9.  Inpatient Medications:  Current Facility-Administered Medications:    0.9 %  sodium chloride infusion, , Intravenous, Continuous, Zierle-Ghosh, Asia B, DO, Last Rate: 100 mL/hr at 10/08/22 0341, New Bag at 10/08/22 0341   0.9 %  sodium chloride infusion, , Intravenous, Continuous, Marguerita Merles, Kiowa Hollar, MD   acetaminophen (TYLENOL) tablet 650 mg, 650 mg, Oral, Q6H PRN **OR** acetaminophen (TYLENOL) suppository 650 mg, 650 mg, Rectal, Q6H PRN, Zierle-Ghosh, Asia B, DO   amLODipine (NORVASC) tablet 10 mg, 10 mg, Oral, Daily, Zierle-Ghosh, Asia B, DO, 10 mg at 10/08/22 0847   atorvastatin (LIPITOR) tablet 80 mg, 80 mg, Oral, Daily, Zierle-Ghosh, Asia B, DO, 80 mg at 10/08/22 0847   carvedilol (COREG) tablet 6.25 mg, 6.25 mg, Oral, BID WC, Zierle-Ghosh, Asia B, DO, 6.25 mg at 10/08/22 0847   hydrALAZINE (APRESOLINE) injection 10 mg, 10 mg, Intravenous, Q4H PRN, Sharl Ma, Sarina Ill, MD   morphine (PF) 2 MG/ML injection 2 mg, 2 mg, Intravenous, Q2H PRN, Zierle-Ghosh, Asia B, DO   nicotine (NICODERM CQ - dosed in mg/24 hours) patch 21 mg, 21 mg, Transdermal, Daily, Zierle-Ghosh, Asia B, DO, 21 mg at 10/08/22 0850   ondansetron (ZOFRAN) tablet 4 mg, 4 mg, Oral, Q6H PRN **OR** ondansetron (ZOFRAN) injection 4 mg, 4 mg, Intravenous, Q6H PRN, Zierle-Ghosh, Asia B, DO   oxyCODONE (Oxy IR/ROXICODONE) immediate release tablet 5 mg, 5 mg, Oral, Q4H PRN, Zierle-Ghosh, Asia B, DO   pantoprazole (PROTONIX) injection 40 mg, 40 mg, Intravenous, Q12H, Zierle-Ghosh, Asia B, DO, 40 mg at 10/08/22 0851   I/O    Intake/Output Summary (Last 24 hours) at  10/08/2022 0908 Last data filed at 10/08/2022 0408 Gross per 24 hour  Intake 2624.95 ml  Output 1300 ml  Net 1324.95 ml     Physical Exam: Temp:  [97.7 F (36.5 C)-98.3 F (36.8 C)] 98.1 F (36.7 C) (09/01 0431) Pulse Rate:  [67-76] 74 (09/01 0431) Resp:  [18-20] 20 (08/31 2200) BP: (150-167)/(68-79) 162/76 (09/01 0431) SpO2:  [99 %-100 %] 99 % (09/01 0431)  Temp (24hrs), Avg:98 F (36.7 C), Min:97.7 F (36.5 C), Max:98.3 F (36.8 C) GENERAL: The patient is AO x3, in no acute distress. Poor historian. HEENT: Head is normocephalic and atraumatic. EOMI are intact. Mouth is well hydrated and without lesions. NECK: Supple. No masses LUNGS: Clear to auscultation. No presence of rhonchi/wheezing/rales. Adequate chest expansion HEART: RRR, normal s1 and s2. ABDOMEN: Soft, nontender, no guarding, no peritoneal signs, and nondistended. BS +. No masses. EXTREMITIES: Without any cyanosis, clubbing, rash, lesions or edema. NEUROLOGIC: AOx3, no focal motor deficit. SKIN: no jaundice, no rashes  Laboratory Data: CBC:     Component Value Date/Time   WBC 6.2 10/08/2022 0514   RBC 3.09 (L) 10/08/2022 0514   HGB 7.9 (L) 10/08/2022 0514   HCT 25.1 (L) 10/08/2022 0514   PLT 85 (L) 10/08/2022 0514   MCV 81.2 10/08/2022 0514   MCH 25.6 (L) 10/08/2022 0514   MCHC 31.5 10/08/2022 0514   RDW 15.9 (H) 10/08/2022 0514   LYMPHSABS 0.9 10/05/2022 1952   MONOABS 0.7 10/05/2022 1952   EOSABS 0.1 10/05/2022 1952   BASOSABS 0.0  10/05/2022 1952   COAG:  Lab Results  Component Value Date   INR 1.2 03/05/2022   INR 1.2 08/31/2021   INR 1.19 11/17/2010    BMP:     Latest Ref Rng & Units 10/08/2022    5:14 AM 10/07/2022    5:14 AM 10/06/2022    5:38 AM  BMP  Glucose 70 - 99 mg/dL 84  161  096   BUN 8 - 23 mg/dL 49  58  61   Creatinine 0.61 - 1.24 mg/dL 0.45  4.09  8.11   Sodium 135 - 145 mmol/L 139  142  142   Potassium 3.5 - 5.1 mmol/L 3.8  3.9  4.1   Chloride 98 - 111 mmol/L 115  117  117    CO2 22 - 32 mmol/L 18  17  18    Calcium 8.9 - 10.3 mg/dL 7.6  7.9  8.2     HEPATIC:     Latest Ref Rng & Units 10/08/2022    5:14 AM 10/06/2022    5:38 AM 03/06/2022    4:46 AM  Hepatic Function  Total Protein 6.5 - 8.1 g/dL 5.2  6.0  6.0   Albumin 3.5 - 5.0 g/dL 2.5  2.9  2.8   AST 15 - 41 U/L 15  20  22    ALT 0 - 44 U/L 12  14  14    Alk Phosphatase 38 - 126 U/L 43  50  53   Total Bilirubin 0.3 - 1.2 mg/dL 1.0  1.3  0.5     CARDIAC:  Lab Results  Component Value Date   CKTOTAL 272 10/05/2022      Imaging: I personally reviewed and interpreted the available labs, imaging and endoscopic files.   Assessment/Plan: 82 y.o. year old male with history of CAD, diabetes, HTN, tobacco abuse, and ?CKD, who comes for evaluation of anemia and heme positive stool.  The patient has not presented any overt gastrointestinal bleeding but had severe anemia (hemoglobin 4.5) that required transfusion upon admission.  Has history of iron deficiency in the past.  Due to this, he underwent an EGD that only showed gastritis in the posterior wall of the stomach but no source of bleeding could be found.  As he has presented worsening anemia that has not stabilized, will need to proceed with colonoscopy.  Unfortunately he did not take his bowel prep yesterday and has not had bowel movements.  I had a thorough conversation with the patient regarding the importance of taking the prep to proceed with colonoscopy tomorrow.  Patient understood and agreed.   - CLD, NPO after MN -Colonoscopy tomorrow -Follow-up EGD pathology report  Katrinka Blazing, MD Gastroenterology and Hepatology [facility] Gastroenterology

## 2022-10-08 NOTE — Progress Notes (Signed)
[facility]  PROGRESS NOTE  TYSHAN ZINGSHEIM WJX:[id number] DOB: 1940/06/01 DOA: 10/05/2022 PCP: [facility]   Brief HPI:   Patient admitted this morning, see detailed H&P by Dr Camillo Flaming  82 y.o. male with medical history significant of coronary artery disease, diabetes mellitus type 2, hypertension, tobacco use disorder, presents to the ED with a chief complaint of fall.  In the ED found to have Hb 4.5, FOBT positive. S/p 2 units PRBC    Assessment/Plan:   Blood loss anemia -Presented with hemoglobin of 4.5, FOBT positive -Status post 3 units PRBC -Hemoglobin improved to 7.9 -Follow CBC in a.m.   GI bleed -Underwent EGD which was unremarkable -Found to have gastritis which was biopsied -Colonoscopy got canceled this morning as patient did not drink GoLytely as directed -Encouraged to take GoLytely, plan for colonoscopy in a.m. -Will keep him n.p.o. after midnight  Acute kidney injury on CKD stage IV -Baseline creatinine 2.67-3.0, presented with creatinine of 4.73 -Improved to 3.76 with IV fluids and blood transfusion; getting close to baseline -Follow renal function in a.m.   UTI -UA suggestive of UTI -Started on Rocephin -Urine culture grew mixed flora -Rocephin was discontinued   Hypertension -Continue Norvasc, Coreg    Medications     amLODipine  10 mg Oral Daily   atorvastatin  80 mg Oral Daily   carvedilol  6.25 mg Oral BID WC   nicotine  21 mg Transdermal Daily   pantoprazole (PROTONIX) IV  40 mg Intravenous Q12H     Data Reviewed:   CBG:  No results for input(s): "GLUCAP" in the last 168 hours.  SpO2: 99 % O2 Flow Rate (L/min): 2 L/min    Vitals:   10/07/22 1044 10/07/22 1332 10/07/22 2200 10/08/22 0431  BP: (!) 165/79 (!) 150/72 (!) 167/68 (!) 162/76  Pulse: 72 70 76 74  Resp: 20 18 20    Temp: 97.7 F (36.5 C) 98.3 F (36.8 C) 98.2 F (36.8 C) 98.1 F (36.7 C)  TempSrc: Oral Oral    SpO2: 100% 99% 99% 99%   Weight:      Height:          Data Reviewed:  Basic Metabolic Panel: Recent Labs  Lab 10/05/22 1952 10/06/22 0538 10/07/22 0514 10/08/22 0514  NA 141 142 142 139  K 4.2 4.1 3.9 3.8  CL 113* 117* 117* 115*  CO2 19* 18* 17* 18*  GLUCOSE 99 108* 103* 84  BUN 65* 61* 58* 49*  CREATININE 4.73* 4.45* 4.03* 3.76*  CALCIUM 8.2* 8.2* 7.9* 7.6*  MG 2.2 2.1  --   --     CBC: Recent Labs  Lab 10/05/22 1952 10/06/22 0538 10/06/22 1103 10/07/22 0514 10/08/22 0514  WBC 5.8 8.5 10.1 7.2 6.2  NEUTROABS 4.0  --   --   --   --   HGB 4.5* 7.8* 7.4* 7.1* 7.9*  HCT 15.0* 24.7* 23.4* 23.2* 25.1*  MCV 80.2 81.5 81.5 82.9 81.2  PLT 99* 93* 92* 93* 85*    LFT Recent Labs  Lab 10/06/22 0538 10/08/22 0514  AST 20 15  ALT 14 12  ALKPHOS 50 43  BILITOT 1.3* 1.0  PROT 6.0* 5.2*  ALBUMIN 2.9* 2.5*     Antibiotics: Anti-infectives (From admission, onward)    Start     Dose/Rate Route Frequency Ordered Stop   10/06/22 2300  cefTRIAXone (ROCEPHIN) 1 g in sodium chloride 0.9 % 100 mL IVPB  Status:  Discontinued  1 g 200 mL/hr over 30 Minutes Intravenous Every 24 hours 10/06/22 0045 10/07/22 1258   10/05/22 2230  cefTRIAXone (ROCEPHIN) 1 g in sodium chloride 0.9 % 100 mL IVPB        1 g 200 mL/hr over 30 Minutes Intravenous  Once 10/05/22 2216 10/05/22 2346        DVT prophylaxis: SCDs  Code Status: Full code  Family Communication: No family at bedside   CONSULTS gastroenterology   Subjective   Did not drink GoLytely as directed.  Colonoscopy was canceled this morning.   Objective    Physical Examination:   General-appears in no acute distress Heart-S1-S2, regular, no murmur auscultated Lungs-clear to auscultation bilaterally, no wheezing or crackles auscultated Abdomen-soft, nontender, no organomegaly Extremities-no edema in the lower extremities Neuro-alert, oriented x3, no focal deficit noted   Status is: Inpatient:             Meredeth Ide   [facility] If 7PM-7AM, please contact night-coverage at [email or web address], Office  [phone]   10/08/2022, 8:52 AM  LOS: 2 days

## 2022-10-08 NOTE — Plan of Care (Signed)
  Problem: Education: Goal: Knowledge of General Education information will improve Description: Including pain rating scale, medication(s)/side effects and non-pharmacologic comfort measures 10/08/2022 1145 by Cathleen Corti, LPN Outcome: Progressing 10/08/2022 1145 by Cathleen Corti, LPN Outcome: Progressing   Problem: Health Behavior/Discharge Planning: Goal: Ability to manage health-related needs will improve 10/08/2022 1145 by Cathleen Corti, LPN Outcome: Progressing 10/08/2022 1145 by Cathleen Corti, LPN Outcome: Progressing   Problem: Clinical Measurements: Goal: Ability to maintain clinical measurements within normal limits will improve 10/08/2022 1145 by Cathleen Corti, LPN Outcome: Progressing 10/08/2022 1145 by Cathleen Corti, LPN Outcome: Progressing Goal: Will remain free from infection 10/08/2022 1145 by Cathleen Corti, LPN Outcome: Progressing 10/08/2022 1145 by Cathleen Corti, LPN Outcome: Progressing Goal: Diagnostic test results will improve 10/08/2022 1145 by Cathleen Corti, LPN Outcome: Progressing 10/08/2022 1145 by Cathleen Corti, LPN Outcome: Progressing Goal: Respiratory complications will improve 10/08/2022 1145 by Cathleen Corti, LPN Outcome: Progressing 10/08/2022 1145 by Cathleen Corti, LPN Outcome: Progressing Goal: Cardiovascular complication will be avoided 10/08/2022 1145 by Cathleen Corti, LPN Outcome: Progressing 10/08/2022 1145 by Cathleen Corti, LPN Outcome: Progressing   Problem: Activity: Goal: Risk for activity intolerance will decrease 10/08/2022 1145 by Cathleen Corti, LPN Outcome: Progressing 10/08/2022 1145 by Cathleen Corti, LPN Outcome: Progressing   Problem: Nutrition: Goal: Adequate nutrition will be maintained 10/08/2022 1145 by Cathleen Corti, LPN Outcome: Progressing 10/08/2022 1145 by Cathleen Corti, LPN Outcome: Progressing   Problem: Coping: Goal: Level  of anxiety will decrease 10/08/2022 1145 by Cathleen Corti, LPN Outcome: Progressing 10/08/2022 1145 by Cathleen Corti, LPN Outcome: Progressing   Problem: Elimination: Goal: Will not experience complications related to bowel motility 10/08/2022 1145 by Cathleen Corti, LPN Outcome: Progressing 10/08/2022 1145 by Cathleen Corti, LPN Outcome: Progressing Goal: Will not experience complications related to urinary retention 10/08/2022 1145 by Cathleen Corti, LPN Outcome: Progressing 10/08/2022 1145 by Cathleen Corti, LPN Outcome: Progressing   Problem: Pain Managment: Goal: General experience of comfort will improve 10/08/2022 1145 by Cathleen Corti, LPN Outcome: Progressing 10/08/2022 1145 by Cathleen Corti, LPN Outcome: Progressing   Problem: Safety: Goal: Ability to remain free from injury will improve 10/08/2022 1145 by Cathleen Corti, LPN Outcome: Progressing 10/08/2022 1145 by Cathleen Corti, LPN Outcome: Progressing   Problem: Skin Integrity: Goal: Risk for impaired skin integrity will decrease 10/08/2022 1145 by Cathleen Corti, LPN Outcome: Progressing 10/08/2022 1145 by Cathleen Corti, LPN Outcome: Progressing

## 2022-10-08 NOTE — Progress Notes (Signed)
Pt up to [facility], passed large amount of brown liquid stool with type 5 solid stool mixed. Pt cleaned and assisted back to bed. Pt has completed 1 cup of bowel prep fluid, reminded pt to continue to drink additional cups of prep at bedside. Pt grumbled and said, "I done drank one cup of that stuff, I ain't gonna drink no more!:". Reminded pt that in order to do procedure tomorrow his bowels need to be empty of all stool. Pt waved me off with his hand and began drinking tea from his lunch tray.

## 2022-10-08 NOTE — Anesthesia Preprocedure Evaluation (Signed)
Anesthesia Evaluation  Patient identified by MRN, date of birth, ID band Patient awake    Reviewed: Allergy & Precautions, H&P , NPO status , Patient's Chart, lab work & pertinent test results, reviewed documented beta blocker date and time   Airway Mallampati: II  TM Distance: >3 FB Neck ROM: full    Dental no notable dental hx.    Pulmonary neg pulmonary ROS, Current Smoker   Pulmonary exam normal breath sounds clear to auscultation       Cardiovascular Exercise Tolerance: Good hypertension, + CAD  negative cardio ROS  Rhythm:regular Rate:Normal     Neuro/Psych negative neurological ROS  negative psych ROS   GI/Hepatic negative GI ROS, Neg liver ROS,,,  Endo/Other  negative endocrine ROSdiabetes    Renal/GU Renal diseasenegative Renal ROS  negative genitourinary   Musculoskeletal   Abdominal   Peds  Hematology negative hematology ROS (+) Blood dyscrasia, anemia   Anesthesia Other Findings   Reproductive/Obstetrics negative OB ROS                             Anesthesia Physical Anesthesia Plan  ASA: 4 and emergent  Anesthesia Plan: General   Post-op Pain Management:    Induction:   PONV Risk Score and Plan: Propofol infusion  Airway Management Planned:   Additional Equipment:   Intra-op Plan:   Post-operative Plan:   Informed Consent: I have reviewed the patients History and Physical, chart, labs and discussed the procedure including the risks, benefits and alternatives for the proposed anesthesia with the patient or authorized representative who has indicated his/her understanding and acceptance.     Dental Advisory Given  Plan Discussed with: CRNA  Anesthesia Plan Comments:        Anesthesia Quick Evaluation

## 2022-10-08 NOTE — Progress Notes (Deleted)
We will proceed with colonoscopy as scheduled.  I thoroughly discussed with the patient the procedure, including the risks involved. Patient understands what the procedure involves including the benefits and any risks. Patient understands alternatives to the proposed procedure. Risks including (but not limited to) bleeding, tearing of the lining (perforation), rupture of adjacent organs, problems with heart and lung function, infection, and medication reactions. A small percentage of complications may require surgery, hospitalization, repeat endoscopic procedure, and/or transfusion.  Patient understood and agreed.  Maylon Peppers, MD Gastroenterology and Hepatology [facility] Gastroenterology

## 2022-10-09 ENCOUNTER — Encounter (HOSPITAL_COMMUNITY)
Admission: EM | Disposition: A | Discharge status: Home Health | Payer: Self-pay | Source: Home / Self Care | Attending: Family Medicine

## 2022-10-09 ENCOUNTER — Inpatient Hospital Stay (HOSPITAL_COMMUNITY): Payer: 59 | Admitting: Anesthesiology

## 2022-10-09 DIAGNOSIS — D123 Benign neoplasm of transverse colon: Secondary | ICD-10-CM

## 2022-10-09 DIAGNOSIS — E860 Dehydration: Secondary | ICD-10-CM

## 2022-10-09 DIAGNOSIS — D122 Benign neoplasm of ascending colon: Secondary | ICD-10-CM

## 2022-10-09 DIAGNOSIS — N179 Acute kidney failure, unspecified: Secondary | ICD-10-CM | POA: Diagnosis not present

## 2022-10-09 DIAGNOSIS — D649 Anemia, unspecified: Secondary | ICD-10-CM | POA: Diagnosis not present

## 2022-10-09 DIAGNOSIS — N3000 Acute cystitis without hematuria: Secondary | ICD-10-CM | POA: Diagnosis not present

## 2022-10-09 DIAGNOSIS — D124 Benign neoplasm of descending colon: Secondary | ICD-10-CM | POA: Diagnosis not present

## 2022-10-09 DIAGNOSIS — D500 Iron deficiency anemia secondary to blood loss (chronic): Secondary | ICD-10-CM | POA: Diagnosis not present

## 2022-10-09 HISTORY — PX: POLYPECTOMY: SHX5525

## 2022-10-09 HISTORY — PX: HEMOSTASIS CLIP PLACEMENT: SHX6857

## 2022-10-09 HISTORY — PX: ENDOSCOPIC MUCOSAL RESECTION: SHX6839

## 2022-10-09 HISTORY — PX: COLONOSCOPY WITH PROPOFOL: SHX5780

## 2022-10-09 LAB — CBC
HCT: 23.3 % — ABNORMAL LOW (ref 39.0–52.0)
Hemoglobin: 7.4 g/dL — ABNORMAL LOW (ref 13.0–17.0)
MCH: 25.8 pg — ABNORMAL LOW (ref 26.0–34.0)
MCHC: 31.8 g/dL (ref 30.0–36.0)
MCV: 81.2 fL (ref 80.0–100.0)
Platelets: 88 10*3/uL — ABNORMAL LOW (ref 150–400)
RBC: 2.87 MIL/uL — ABNORMAL LOW (ref 4.22–5.81)
RDW: 15.8 % — ABNORMAL HIGH (ref 11.5–15.5)
WBC: 5.6 10*3/uL (ref 4.0–10.5)
nRBC: 0 % (ref 0.0–0.2)

## 2022-10-09 LAB — BASIC METABOLIC PANEL
Anion gap: 5 (ref 5–15)
BUN: 43 mg/dL — ABNORMAL HIGH (ref 8–23)
CO2: 19 mmol/L — ABNORMAL LOW (ref 22–32)
Calcium: 7.9 mg/dL — ABNORMAL LOW (ref 8.9–10.3)
Chloride: 116 mmol/L — ABNORMAL HIGH (ref 98–111)
Creatinine, Ser: 3.81 mg/dL — ABNORMAL HIGH (ref 0.61–1.24)
GFR, Estimated: 15 mL/min — ABNORMAL LOW (ref >60)
Glucose, Bld: 78 mg/dL (ref 70–99)
Potassium: 3.5 mmol/L (ref 3.5–5.1)
Sodium: 140 mmol/L (ref 135–145)

## 2022-10-09 SURGERY — COLONOSCOPY WITH PROPOFOL
Anesthesia: General

## 2022-10-09 MED ORDER — SODIUM CHLORIDE 0.9 % IV SOLN: INTRAVENOUS | Status: AC

## 2022-10-09 MED ORDER — SODIUM CHLORIDE 0.9 % IV SOLN: INTRAVENOUS | Status: DC

## 2022-10-09 MED ORDER — PROPOFOL 10 MG/ML IV BOLUS
INTRAVENOUS | Status: AC
  Filled 2022-10-09: qty 40

## 2022-10-09 MED ORDER — LACTATED RINGERS IV SOLN: INTRAVENOUS | Status: DC

## 2022-10-09 MED ORDER — LIDOCAINE HCL (PF) 2 % IJ SOLN
INTRAMUSCULAR | Status: AC
  Filled 2022-10-09: qty 5

## 2022-10-09 MED ORDER — PROPOFOL 500 MG/50ML IV EMUL
INTRAVENOUS | Status: DC | PRN
  Administered 2022-10-09: 150 ug/kg/min via INTRAVENOUS

## 2022-10-09 MED ORDER — ORAL CARE MOUTH RINSE: 15.0000 mL | OROMUCOSAL | Status: DC | PRN

## 2022-10-09 MED ORDER — LACTATED RINGERS IV SOLN: INTRAVENOUS | Status: DC | PRN

## 2022-10-09 MED ORDER — SODIUM CHLORIDE 0.9 % IV SOLN
INTRAVENOUS | Status: DC
Start: 2022-10-09 — End: 2022-10-09

## 2022-10-09 MED ORDER — PROPOFOL 10 MG/ML IV BOLUS
INTRAVENOUS | Status: DC | PRN
  Administered 2022-10-09: 10 mg via INTRAVENOUS
  Administered 2022-10-09: 20 mg via INTRAVENOUS
  Administered 2022-10-09: 50 mg via INTRAVENOUS
  Administered 2022-10-09 (×3): 20 mg via INTRAVENOUS

## 2022-10-09 MED ORDER — LIDOCAINE HCL (CARDIAC) PF 100 MG/5ML IV SOSY
PREFILLED_SYRINGE | INTRAVENOUS | Status: DC | PRN
  Administered 2022-10-09: 50 mg via INTRATRACHEAL

## 2022-10-09 NOTE — Anesthesia Preprocedure Evaluation (Addendum)
Anesthesia Evaluation  Patient identified by MRN, date of birth, ID band Patient awake    Reviewed: Allergy & Precautions, H&P , NPO status , Patient's Chart, lab work & pertinent test results, reviewed documented beta blocker date and time   History of Anesthesia Complications Negative for: history of anesthetic complications  Airway Mallampati: II  TM Distance: >3 FB Neck ROM: Full    Dental  (+) Dental Advisory Given, Poor Dentition, Missing   Pulmonary Current Smoker and Patient abstained from smoking.   Pulmonary exam normal breath sounds clear to auscultation       Cardiovascular Exercise Tolerance: Poor hypertension, Pt. on medications and Pt. on home beta blockers + CAD  Normal cardiovascular exam Rhythm:Regular Rate:Normal     Neuro/Psych negative neurological ROS  negative psych ROS   GI/Hepatic negative GI ROS, Neg liver ROS,,,  Endo/Other  diabetes    Renal/GU Renal InsufficiencyRenal disease (AKI on CRI)  negative genitourinary   Musculoskeletal negative musculoskeletal ROS (+)    Abdominal   Peds negative pediatric ROS (+)  Hematology  (+) Blood dyscrasia, anemia   Anesthesia Other Findings   Reproductive/Obstetrics negative OB ROS                             Anesthesia Physical Anesthesia Plan  ASA: 3  Anesthesia Plan: General   Post-op Pain Management: Minimal or no pain anticipated   Induction: Intravenous  PONV Risk Score and Plan: 1 and Propofol infusion  Airway Management Planned: Natural Airway and Nasal Cannula  Additional Equipment:   Intra-op Plan:   Post-operative Plan:   Informed Consent: I have reviewed the patients History and Physical, chart, labs and discussed the procedure including the risks, benefits and alternatives for the proposed anesthesia with the patient or authorized representative who has indicated his/her understanding and  acceptance.     Dental advisory given and Consent reviewed with POA  Plan Discussed with: CRNA and Surgeon  Anesthesia Plan Comments:         Anesthesia Quick Evaluation

## 2022-10-09 NOTE — Anesthesia Postprocedure Evaluation (Signed)
Anesthesia Post Note  Patient: Isaac Hunter  Procedure(s) Performed: COLONOSCOPY WITH PROPOFOL POLYPECTOMY ENDOSCOPIC MUCOSAL RESECTION HEMOSTASIS CLIP PLACEMENT  Patient location during evaluation: PACU Anesthesia Type: General Level of consciousness: awake and alert, sedated and patient cooperative Pain management: pain level controlled Vital Signs Assessment: post-procedure vital signs reviewed and stable Respiratory status: spontaneous breathing, nonlabored ventilation and respiratory function stable Cardiovascular status: blood pressure returned to baseline and stable Postop Assessment: no apparent nausea or vomiting Anesthetic complications: no  No notable events documented.   Last Vitals:  Vitals:   10/09/22 1045 10/09/22 1126  BP:  (!) 168/76  Pulse: 65 65  Resp: (!) 24 16  Temp:  36.4 C  SpO2: 100% 100%    Last Pain:  Vitals:   10/09/22 1030  TempSrc:   PainSc: 0-No pain                 Saban Heinlen C Corbyn Steedman

## 2022-10-09 NOTE — Progress Notes (Signed)
[facility]  PROGRESS NOTE  Isaac Hunter:[id number] DOB: 01/31/1941 DOA: 10/05/2022 PCP: [facility] [employer]   Brief HPI:   Patient admitted this morning, see detailed H&P by Dr Camillo Flaming  82 y.o. male with medical history significant of coronary artery disease, diabetes mellitus type 2, hypertension, tobacco use disorder, presents to the ED with a chief complaint of fall.  In the ED found to have Hb 4.5, FOBT positive. S/p 2 units PRBC    Assessment/Plan:    Blood loss anemia -Presented with hemoglobin of 4.5, FOBT positive -Status post 3 units PRBC -Hemoglobin improved to 7.9 -Follow CBC in a.m.   GI bleed -Underwent EGD which was unremarkable -Found to have gastritis which was biopsied -Colonoscopy got canceled on 10/08/2022 as patient did not drink GoLytely -He drank GoLytely , plan for colonoscopy today  Acute kidney injury on CKD stage IV -Baseline creatinine 2.67-3.0, presented with creatinine of 4.73 -Improved to 3.81 with IV fluids and blood transfusion; getting close to baseline -Continue gentle IV rehydration with normal saline at 75 mL per hour -Follow renal function in a.m.   UTI -UA suggestive of UTI -Started on Rocephin -Urine culture grew mixed flora -Rocephin was discontinued   Hypertension -Continue Norvasc, Coreg   Medications     amLODipine  10 mg Oral Daily   atorvastatin  80 mg Oral Daily   carvedilol  6.25 mg Oral BID WC   nicotine  21 mg Transdermal Daily   pantoprazole (PROTONIX) IV  40 mg Intravenous Q12H     Data Reviewed:   CBG:  No results for input(s): "GLUCAP" in the last 168 hours.  SpO2: 99 % O2 Flow Rate (L/min): 2 L/min    Vitals:   10/08/22 2047 10/09/22 0622 10/09/22 0627 10/09/22 0807  BP: 137/65 (!) 170/88 (!) 167/83 (!) 163/69  Pulse: 81 88 77   Resp:  20    Temp: 98.4 F (36.9 C) 97.6 F (36.4 C)    TempSrc:      SpO2: 99% 97% 99%   Weight:      Height:          Data  Reviewed:  Basic Metabolic Panel: Recent Labs  Lab 10/05/22 1952 10/06/22 0538 10/07/22 0514 10/08/22 0514 10/09/22 0518  NA 141 142 142 139 140  K 4.2 4.1 3.9 3.8 3.5  CL 113* 117* 117* 115* 116*  CO2 19* 18* 17* 18* 19*  GLUCOSE 99 108* 103* 84 78  BUN 65* 61* 58* 49* 43*  CREATININE 4.73* 4.45* 4.03* 3.76* 3.81*  CALCIUM 8.2* 8.2* 7.9* 7.6* 7.9*  MG 2.2 2.1  --   --   --     CBC: Recent Labs  Lab 10/05/22 1952 10/06/22 0538 10/06/22 1103 10/07/22 0514 10/08/22 0514 10/09/22 0518  WBC 5.8 8.5 10.1 7.2 6.2 5.6  NEUTROABS 4.0  --   --   --   --   --   HGB 4.5* 7.8* 7.4* 7.1* 7.9* 7.4*  HCT 15.0* 24.7* 23.4* 23.2* 25.1* 23.3*  MCV 80.2 81.5 81.5 82.9 81.2 81.2  PLT 99* 93* 92* 93* 85* 88*    LFT Recent Labs  Lab 10/06/22 0538 10/08/22 0514  AST 20 15  ALT 14 12  ALKPHOS 50 43  BILITOT 1.3* 1.0  PROT 6.0* 5.2*  ALBUMIN 2.9* 2.5*     Antibiotics: Anti-infectives (From admission, onward)    Start     Dose/Rate Route Frequency Ordered Stop   10/06/22 2300  cefTRIAXone (ROCEPHIN) 1 g in sodium chloride 0.9 % 100 mL IVPB  Status:  Discontinued        1 g 200 mL/hr over 30 Minutes Intravenous Every 24 hours 10/06/22 0045 10/07/22 1258   10/05/22 2230  cefTRIAXone (ROCEPHIN) 1 g in sodium chloride 0.9 % 100 mL IVPB        1 g 200 mL/hr over 30 Minutes Intravenous  Once 10/05/22 2216 10/05/22 2346        DVT prophylaxis: SCDs  Code Status: Full code  Family Communication: No family at bedside   CONSULTS gastroenterology   Subjective   Denies any complaints, plan for colonoscopy today.   Objective    Physical Examination:  Appears in no acute distress S1-S2, regular Lungs clear to auscultation bilaterally Abdomen is soft, nontender, no organomegaly   Status is: Inpatient:             Meredeth Ide   [facility] If 7PM-7AM, please contact night-coverage at [email or web address], Office  [phone]   10/09/2022, 8:21 AM   LOS: 3 days

## 2022-10-09 NOTE — Transfer of Care (Signed)
Immediate Anesthesia Transfer of Care Note  Patient: Isaac Hunter  Procedure(s) Performed: COLONOSCOPY WITH PROPOFOL POLYPECTOMY ENDOSCOPIC MUCOSAL RESECTION HEMOSTASIS CLIP PLACEMENT  Patient Location: PACU  Anesthesia Type:General  Level of Consciousness: sedated and drowsy  Airway & Oxygen Therapy: Patient Spontanous Breathing and Patient connected to nasal cannula oxygen  Post-op Assessment: Report given to RN and Post -op Vital signs reviewed and stable  Post vital signs: Reviewed and stable  Last Vitals:  Vitals Value Taken Time  BP 115/55 10/09/22  1031  Temp 97.6 10/09/22   1031  Pulse 66 10/09/22 1036  Resp 16 10/09/22 1036  SpO2 100 % 10/09/22 1036  Vitals shown include unfiled device data.  Last Pain:  Vitals:   10/09/22 0938  TempSrc:   PainSc: 0-No pain         Complications: No notable events documented.

## 2022-10-09 NOTE — Brief Op Note (Signed)
10/09/2022  10:27 AM  PATIENT:  Isaac Hunter  82 y.o. male  PRE-OPERATIVE DIAGNOSIS:  iron deficiency anemia  POST-OPERATIVE DIAGNOSIS:  descending colon polyps times two , ascending colon polyp, transverse colon polyps times three, hemorrhoids  PROCEDURE:  Procedure(s): COLONOSCOPY WITH PROPOFOL (N/A) POLYPECTOMY ENDOSCOPIC MUCOSAL RESECTION HEMOSTASIS CLIP PLACEMENT  SURGEON:  Surgeons and Role:    * Dolores Frame, MD - Primary  Patient underwent colonoscopy under propofol sedation.  Tolerated the procedure adequately.   FINDINGS: - Hemorrhoids found on perianal exam.  - The examined portion of the ileum was normal.  - Two 5 to 8 mm polyps in the transverse colon and in the ascending colon, removed with a cold snare.  Resected and retrieved.  - One 12 to 15 mm polyp in the transverse colon, removed with a cold snare.  Resected and retrieved via EMR.  - One 20 mm polyp in the transverse colon, removed with a hot snare.  Resected piecemeal and retrieved via EMR. Clips were placed.  Clip manufacturer: [employer].  - Two 4 to 10 mm polyps in the descending colon, removed with a cold snare.  Resected and retrieved.  - Non-bleeding external and internal hemorrhoids.   RECOMMENDATIONS - Resume previous diet, NPO after MN. - Await pathology results.  - Repeat colonoscopy in 1 year for surveillance after piecemeal polypectomy.  - Return patient to hospital ward for ongoing care.  - Proceed with capsule endoscopy tomorrow.  Katrinka Blazing, MD Gastroenterology and Hepatology [facility] Gastroenterology

## 2022-10-09 NOTE — Progress Notes (Signed)
We will proceed with colonoscopy as scheduled.  I thoroughly discussed with the patient the procedure, including the risks involved. Patient understands what the procedure involves including the benefits and any risks. Patient understands alternatives to the proposed procedure. Risks including (but not limited to) bleeding, tearing of the lining (perforation), rupture of adjacent organs, problems with heart and lung function, infection, and medication reactions. A small percentage of complications may require surgery, hospitalization, repeat endoscopic procedure, and/or transfusion.  Patient understood and agreed.  Isaac Peppers, MD Gastroenterology and Hepatology [facility] Gastroenterology

## 2022-10-09 NOTE — Plan of Care (Signed)
Cont with care

## 2022-10-09 NOTE — Op Note (Signed)
[facility] Patient Name: Isaac Hunter Procedure Date: 10/09/2022 9:04 AM MRN: [id number] Date of Birth: May 13, 1940 Attending MD: Katrinka Blazing , , [id number] CSN: [id number] Age: 82 Admit Type: Inpatient Procedure:                Colonoscopy Indications:              Iron deficiency anemia Providers:                Katrinka Blazing, Buel Ream. Thomasena Edis RN, RN,                            Zena Amos Referring MD:              Medicines:                Monitored Anesthesia Care Complications:            No immediate complications. Estimated Blood Loss:     Estimated blood loss: none. Procedure:                Pre-Anesthesia Assessment:                           - Prior to the procedure, a History and Physical                            was performed, and patient medications, allergies                            and sensitivities were reviewed. The patient's                            tolerance of previous anesthesia was reviewed.                           - The risks and benefits of the procedure and the                            sedation options and risks were discussed with the                            patient. All questions were answered and informed                            consent was obtained.                           - ASA Grade Assessment: III - A patient with severe                            systemic disease.                           After obtaining informed consent, the colonoscope                            was passed under direct vision. Throughout the  procedure, the patient's blood pressure, pulse, and                            oxygen saturations were monitored continuously. The                            [id number]-HQ190L ([id number]) scope was introduced through                            the anus and advanced to the the terminal ileum.                            The colonoscopy was performed with ease. The                             patient tolerated the procedure but had some                            transient episodes of bradycardia and pauses that                            required scope removal. The quality of the bowel                            preparation was adequate to identify polyps greater                            than 5 mm in size. Scope In: 9:39:41 AM Scope Out: 10:23:25 AM Scope Withdrawal Time: 0 hours 36 minutes 5 seconds  Total Procedure Duration: 0 hours 43 minutes 44 seconds  Findings:      Hemorrhoids were found on perianal exam.      The terminal ileum appeared normal.      Two sessile polyps were found in the transverse colon and ascending       colon. The polyps were 5 to 8 mm in size. These polyps were removed with       a cold snare. Resection and retrieval were complete.      A 12 to 15 mm polyp was found in the transverse colon. The polyp was       sessile. Area was successfully injected with 1 mL Eleview for a lift       polypectomy. Imaging was performed using white light and narrow band       imaging to visualize the mucosa and demarcate the polyp site after       injection for EMR purposes. The polyp was removed with a cold snare.       Resection and retrieval were complete.      A 20 mm polyp was found in the transverse colon. The polyp was       semi-sessile. Area was successfully injected with 1 mL Eleview for a       lift polypectomy. Imaging was performed using white light and narrow       band imaging to visualize the mucosa and demarcate the polyp site after       injection for EMR purposes. The polyp was removed with a hot  snare in       two pieces piecemeal. Resection and retrieval were complete. To prevent       bleeding after the polypectomy, three hemostatic clips were successfully       placed. Clip manufacturer: [employer]. There was no bleeding at       the end of the procedure.      Two sessile polyps were found in the descending colon. The polyps were  4       to 10 mm in size. These polyps were removed with a cold snare. Resection       and retrieval were complete.      Non-bleeding external and internal hemorrhoids were found during       retroflexion. The hemorrhoids were medium-sized.      I called the patient's son, no answer, left a detailed voice message       with findings and plan. Impression:               - Hemorrhoids found on perianal exam.                           - The examined portion of the ileum was normal.                           - Two 5 to 8 mm polyps in the transverse colon and                            in the ascending colon, removed with a cold snare.                            Resected and retrieved.                           - One 12 to 15 mm polyp in the transverse colon,                            removed with a cold snare. Resected and retrieved                            via EMR.                           - One 20 mm polyp in the transverse colon, removed                            with a hot snare. Resected piecemeal and retrieved                            via EMR. Clips were placed. Clip manufacturer:                            [employer].                           - Two 4 to 10 mm polyps in the descending colon,  removed with a cold snare. Resected and retrieved.                           - Non-bleeding external and internal hemorrhoids. Moderate Sedation:      Per Anesthesia Care Recommendation:           - Resume previous diet, NPO after MN.                           - Await pathology results.                           - Repeat colonoscopy in 1 year for surveillance                            after piecemeal polypectomy.                           - Return patient to hospital ward for ongoing care.                           - Proceed with capsule endoscopy tomorrow. Procedure Code(s):        --- Professional ---                           [phone], 52, Colonoscopy,  flexible; with removal of                            tumor(s), polyp(s), or other lesion(s) by snare                            technique                           45381, Colonoscopy, flexible; with directed                            submucosal injection(s), any substance Diagnosis Code(s):        --- Professional ---                           K64.8, Other hemorrhoids                           D12.3, Benign neoplasm of transverse colon (hepatic                            flexure or splenic flexure)                           D12.2, Benign neoplasm of ascending colon                           D12.4, Benign neoplasm of descending colon                           D50.9, Iron deficiency anemia, unspecified CPT  copyright 2022 American Medical Association. All rights reserved. The codes documented in this report are preliminary and upon coder review may  be revised to meet current compliance requirements. Katrinka Blazing, MD Katrinka Blazing,  10/09/2022 10:36:01 AM This report has been signed electronically. Number of Addenda: 0

## 2022-10-09 NOTE — Progress Notes (Signed)
Assumed care of pt from off going RN. No changes in initial am assessment.Cont with plan of care. Returned from [employer]. Condition stable.

## 2022-10-10 ENCOUNTER — Encounter (HOSPITAL_COMMUNITY): Payer: Self-pay | Admitting: Family Medicine

## 2022-10-10 DIAGNOSIS — D500 Iron deficiency anemia secondary to blood loss (chronic): Secondary | ICD-10-CM | POA: Diagnosis not present

## 2022-10-10 DIAGNOSIS — N179 Acute kidney failure, unspecified: Secondary | ICD-10-CM | POA: Diagnosis not present

## 2022-10-10 DIAGNOSIS — N3000 Acute cystitis without hematuria: Secondary | ICD-10-CM | POA: Diagnosis not present

## 2022-10-10 DIAGNOSIS — D649 Anemia, unspecified: Secondary | ICD-10-CM | POA: Diagnosis not present

## 2022-10-10 LAB — BASIC METABOLIC PANEL
Anion gap: 8 (ref 5–15)
BUN: 39 mg/dL — ABNORMAL HIGH (ref 8–23)
CO2: 18 mmol/L — ABNORMAL LOW (ref 22–32)
Calcium: 8.1 mg/dL — ABNORMAL LOW (ref 8.9–10.3)
Chloride: 115 mmol/L — ABNORMAL HIGH (ref 98–111)
Creatinine, Ser: 3.58 mg/dL — ABNORMAL HIGH (ref 0.61–1.24)
GFR, Estimated: 16 mL/min — ABNORMAL LOW (ref >60)
Glucose, Bld: 82 mg/dL (ref 70–99)
Potassium: 3.6 mmol/L (ref 3.5–5.1)
Sodium: 141 mmol/L (ref 135–145)

## 2022-10-10 LAB — CBC
HCT: 25.7 % — ABNORMAL LOW (ref 39.0–52.0)
Hemoglobin: 8.1 g/dL — ABNORMAL LOW (ref 13.0–17.0)
MCH: 26 pg (ref 26.0–34.0)
MCHC: 31.5 g/dL (ref 30.0–36.0)
MCV: 82.6 fL (ref 80.0–100.0)
Platelets: 84 10*3/uL — ABNORMAL LOW (ref 150–400)
RBC: 3.11 MIL/uL — ABNORMAL LOW (ref 4.22–5.81)
RDW: 16 % — ABNORMAL HIGH (ref 11.5–15.5)
WBC: 5.5 10*3/uL (ref 4.0–10.5)
nRBC: 0 % (ref 0.0–0.2)

## 2022-10-10 MED ORDER — SODIUM CHLORIDE 0.9 % IV SOLN: INTRAVENOUS | Status: AC

## 2022-10-10 NOTE — Progress Notes (Signed)
Patient attempted to swallow pill cam and was unable to get capsule down.  Patient refused to attempt swallowing the capsule again - stated he was afraid he would choke. Notified Dr. Levon Hedger

## 2022-10-10 NOTE — Progress Notes (Signed)
Subjective: Patient reports he is doing fine.  States the pill was too large to swallow.  States the pill needs to be smaller.  Nervous about having the pill placed in his small bowel stating that it may get stuck and he might have to have surgery.  No brbpr or melena. No abdominal pain, nausea, or vomiting. Wants something to drink.   Objective: Vital signs in last 24 hours: Temp:  [97.8 F (36.6 C)-98.2 F (36.8 C)] 97.8 F (36.6 C) (09/03 0537) Pulse Rate:  [75-81] 81 (09/03 0537) Resp:  [18-20] 20 (09/03 0537) BP: (157-192)/(73-101) 164/80 (09/03 0537) SpO2:  [98 %-100 %] 100 % (09/03 0537) Last BM Date : 10/09/22 General:   Alert, pleasant, NAD Head:  Normocephalic and atraumatic. Abdomen:  Bowel sounds present, soft, non-tender, non-distended. No HSM or hernias noted. No rebound or guarding. No masses appreciated  Msk:  Symmetrical without gross deformities. Normal posture. Extremities:  Without edema. Skin:  Warm and dry, intact without significant lesions.  Psych: Normal mood and affect.  Intake/Output from previous day: 09/02 0701 - 09/03 0700 In: 3951.4 [P.O.:240; I.V.:3711.4] Out: 900 [Urine:900] Intake/Output this shift: No intake/output data recorded.  Lab Results: Recent Labs    10/08/22 0514 10/09/22 0518 10/10/22 1217  WBC 6.2 5.6 5.5  HGB 7.9* 7.4* 8.1*  HCT 25.1* 23.3* 25.7*  PLT 85* 88* 84*   BMET Recent Labs    10/08/22 0514 10/09/22 0518 10/10/22 1217  NA 139 140 141  K 3.8 3.5 3.6  CL 115* 116* 115*  CO2 18* 19* 18*  GLUCOSE 84 78 82  BUN 49* 43* 39*  CREATININE 3.76* 3.81* 3.58*  CALCIUM 7.6* 7.9* 8.1*   LFT Recent Labs    10/08/22 0514  PROT 5.2*  ALBUMIN 2.5*  AST 15  ALT 12  ALKPHOS 43  BILITOT 1.0     Assessment: 82 y.o. year old male with history of CAD, diabetes, HTN, tobacco abuse, and ?CKD, who comes for evaluation of anemia and heme positive stool. He has not had any overt GI bleeding but had severe anemia with  hemoglobin 4.5. No evidence of iron, B12, or folate deficiency though he does have history of iron deficiency in the past.  He has received a total of 3 units PRBCs with last unit received on 8/31.  Hemoglobin improved to 7.9 on 9/1, 7.4 yesterday, CBC pending currently.   He underwent EGD 8/30 that only showed gastritis in the posterior wall of the stomach, but no source of bleeding could be found.  Colonoscopy performed 9/2 with two 5-8 mm polyps resected and retrieved, one 12-15 mm polyp resected and retrieved, one 20 mm polyp resected piecemeal and clips placed, two 4-10 mm polyps resected and retrieved.  Also with nonbleeding external and internal hemorrhoids.  He was advised to repeat colonoscopy in 1 year and proceed with capsule endoscopy today.  Unfortunately, patient was not able to swallow the givens capsule.  I spent an extensive amount of time discussing with the patient the possibility of performing an EGD for capsule placement, but he is very hesitant to have this done for unclear reasons other than stating he is concerned about it getting stuck.  He denies any history of abdominal surgeries.  I discussed that the risk is low for the pill to become stuck in his small intestine.  He requested that I call his son to discuss this.  I discussed my recommendations with his son who then spoke to  the patient, but patient still not agreeing to have EGD.  Son states that he will try to come by the hospital this afternoon after work to talk to the patient in person to see if he can explain the importance of proceeding with the givens capsule.    Plan: Clear liquid diet today. Monitor for overt GI bleeding. Continue to monitor H&H and transfuse as needed. Will make n.p.o. at midnight in the instance that patient is agreeable to EGD with givens capsule placement tomorrow.  Follow-up on pending EGD and colonoscopy path report.   LOS: 4 days    10/10/2022, 12:55 PM   Ermalinda Memos, [facility]  Gastroenterology

## 2022-10-10 NOTE — Progress Notes (Signed)
[facility]  PROGRESS NOTE  Isaac Hunter ZOX:[id number] DOB: 20-Jan-1941 DOA: 10/05/2022 PCP: [facility]   Brief HPI:   Patient admitted this morning, see detailed H&P by Dr Camillo Flaming  82 y.o. male with medical history significant of coronary artery disease, diabetes mellitus type 2, hypertension, tobacco use disorder, presents to the ED with a chief complaint of fall.  In the ED found to have Hb 4.5, FOBT positive. S/p 2 units PRBC    Assessment/Plan:    Blood loss anemia -Presented with hemoglobin of 4.5, FOBT positive -Status post 3 units PRBC -Hemoglobin improved to 8.1 -Follow CBC in a.m.   GI bleed -Underwent EGD which was unremarkable -Found to have gastritis which was biopsied -Colonoscopy got canceled on 10/08/2022 as patient did not drink GoLytely -He drank GoLytely , underwent colonoscopy and multiple polyps were removed -Surgical pathology is currently pending -He was supposed to undergo capsule endoscopy today however he he refused to swallow capsule.  Plan for EGD in a.m. to place the capsule. -Patient is not sure whether he wants this to be done, his son is going to come and talk to him today.  Acute kidney injury on CKD stage IV -Baseline creatinine 2.67-3.0, presented with creatinine of 4.73 -Improved to 3.58 with IV fluids and blood transfusion; getting close to baseline -Continue gentle IV rehydration with normal saline at 75 mL per hour -Follow renal function in a.m.   UTI -UA suggestive of UTI -Started on Rocephin -Urine culture grew mixed flora -Rocephin was discontinued   Hypertension -Continue Norvasc, Coreg   Medications     amLODipine  10 mg Oral Daily   atorvastatin  80 mg Oral Daily   carvedilol  6.25 mg Oral BID WC   nicotine  21 mg Transdermal Daily   pantoprazole (PROTONIX) IV  40 mg Intravenous Q12H     Data Reviewed:   CBG:  No results for input(s): "GLUCAP" in the last 168 hours.  SpO2: 100 % O2  Flow Rate (L/min): 3 L/min    Vitals:   10/09/22 2236 10/09/22 2258 10/09/22 2338 10/10/22 0537  BP: (!) 187/92 (!) 192/101 (!) 161/73 (!) 164/80  Pulse: 80  75 81  Resp: 20   20  Temp: 98.2 F (36.8 C)   97.8 F (36.6 C)  TempSrc: Oral   Oral  SpO2: 100%   100%  Weight:      Height:          Data Reviewed:  Basic Metabolic Panel: Recent Labs  Lab 10/05/22 1952 10/06/22 0538 10/07/22 0514 10/08/22 0514 10/09/22 0518 10/10/22 1217  NA 141 142 142 139 140 141  K 4.2 4.1 3.9 3.8 3.5 3.6  CL 113* 117* 117* 115* 116* 115*  CO2 19* 18* 17* 18* 19* 18*  GLUCOSE 99 108* 103* 84 78 82  BUN 65* 61* 58* 49* 43* 39*  CREATININE 4.73* 4.45* 4.03* 3.76* 3.81* 3.58*  CALCIUM 8.2* 8.2* 7.9* 7.6* 7.9* 8.1*  MG 2.2 2.1  --   --   --   --     CBC: Recent Labs  Lab 10/05/22 1952 10/06/22 0538 10/06/22 1103 10/07/22 0514 10/08/22 0514 10/09/22 0518 10/10/22 1217  WBC 5.8   < > 10.1 7.2 6.2 5.6 5.5  NEUTROABS 4.0  --   --   --   --   --   --   HGB 4.5*   < > 7.4* 7.1* 7.9* 7.4* 8.1*  HCT 15.0*   < >  23.4* 23.2* 25.1* 23.3* 25.7*  MCV 80.2   < > 81.5 82.9 81.2 81.2 82.6  PLT 99*   < > 92* 93* 85* 88* 84*   < > = values in this interval not displayed.    LFT Recent Labs  Lab 10/06/22 0538 10/08/22 0514  AST 20 15  ALT 14 12  ALKPHOS 50 43  BILITOT 1.3* 1.0  PROT 6.0* 5.2*  ALBUMIN 2.9* 2.5*     Antibiotics: Anti-infectives (From admission, onward)    Start     Dose/Rate Route Frequency Ordered Stop   10/06/22 2300  cefTRIAXone (ROCEPHIN) 1 g in sodium chloride 0.9 % 100 mL IVPB  Status:  Discontinued        1 g 200 mL/hr over 30 Minutes Intravenous Every 24 hours 10/06/22 0045 10/07/22 1258   10/05/22 2230  cefTRIAXone (ROCEPHIN) 1 g in sodium chloride 0.9 % 100 mL IVPB        1 g 200 mL/hr over 30 Minutes Intravenous  Once 10/05/22 2216 10/05/22 2346        DVT prophylaxis: SCDs  Code Status: Full code  Family Communication: No family at  bedside   CONSULTS gastroenterology   Subjective   Denies any complaints.  Refused to swallow capsule form capsule endoscopy today.   Objective    Physical Examination:  General-appears in no acute distress Heart-S1-S2, regular, no murmur auscultated Lungs-clear to auscultation bilaterally, no wheezing or crackles auscultated Abdomen-soft, nontender, no organomegaly Extremities-no edema in the lower extremities Neuro-alert, oriented x3, no focal deficit noted   Status is: Inpatient:             Meredeth Ide   [facility] If 7PM-7AM, please contact night-coverage at [email or web address], Office  [phone]   10/10/2022, 2:13 PM  LOS: 4 days

## 2022-10-10 NOTE — Plan of Care (Signed)
  Problem: Elimination: Goal: Will not experience complications related to urinary retention Outcome: Progressing   Problem: Pain Managment: Goal: General experience of comfort will improve Outcome: Progressing   Problem: Safety: Goal: Ability to remain free from injury will improve Outcome: Progressing   

## 2022-10-11 ENCOUNTER — Encounter (HOSPITAL_COMMUNITY)
Admission: EM | Disposition: A | Discharge status: Home Health | Payer: Self-pay | Source: Home / Self Care | Attending: Family Medicine

## 2022-10-11 ENCOUNTER — Encounter (HOSPITAL_COMMUNITY): Payer: Self-pay | Admitting: Gastroenterology

## 2022-10-11 DIAGNOSIS — D696 Thrombocytopenia, unspecified: Secondary | ICD-10-CM | POA: Diagnosis not present

## 2022-10-11 DIAGNOSIS — D649 Anemia, unspecified: Secondary | ICD-10-CM | POA: Diagnosis not present

## 2022-10-11 DIAGNOSIS — N184 Chronic kidney disease, stage 4 (severe): Secondary | ICD-10-CM

## 2022-10-11 DIAGNOSIS — D500 Iron deficiency anemia secondary to blood loss (chronic): Secondary | ICD-10-CM | POA: Diagnosis not present

## 2022-10-11 DIAGNOSIS — N179 Acute kidney failure, unspecified: Secondary | ICD-10-CM | POA: Insufficient documentation

## 2022-10-11 DIAGNOSIS — R195 Other fecal abnormalities: Secondary | ICD-10-CM | POA: Diagnosis not present

## 2022-10-11 HISTORY — PX: GIVENS CAPSULE STUDY: SHX5432

## 2022-10-11 LAB — BASIC METABOLIC PANEL
Anion gap: 11 (ref 5–15)
BUN: 36 mg/dL — ABNORMAL HIGH (ref 8–23)
CO2: 16 mmol/L — ABNORMAL LOW (ref 22–32)
Calcium: 8 mg/dL — ABNORMAL LOW (ref 8.9–10.3)
Chloride: 114 mmol/L — ABNORMAL HIGH (ref 98–111)
Creatinine, Ser: 3.52 mg/dL — ABNORMAL HIGH (ref 0.61–1.24)
GFR, Estimated: 16 mL/min — ABNORMAL LOW (ref >60)
Glucose, Bld: 80 mg/dL (ref 70–99)
Potassium: 3.4 mmol/L — ABNORMAL LOW (ref 3.5–5.1)
Sodium: 141 mmol/L (ref 135–145)

## 2022-10-11 LAB — CBC
HCT: 24.3 % — ABNORMAL LOW (ref 39.0–52.0)
Hemoglobin: 7.7 g/dL — ABNORMAL LOW (ref 13.0–17.0)
MCH: 25.5 pg — ABNORMAL LOW (ref 26.0–34.0)
MCHC: 31.7 g/dL (ref 30.0–36.0)
MCV: 80.5 fL (ref 80.0–100.0)
Platelets: 82 10*3/uL — ABNORMAL LOW (ref 150–400)
RBC: 3.02 MIL/uL — ABNORMAL LOW (ref 4.22–5.81)
RDW: 16 % — ABNORMAL HIGH (ref 11.5–15.5)
WBC: 4.7 10*3/uL (ref 4.0–10.5)
nRBC: 0 % (ref 0.0–0.2)

## 2022-10-11 SURGERY — IMAGING PROCEDURE, GI TRACT, INTRALUMINAL, VIA CAPSULE

## 2022-10-11 NOTE — Progress Notes (Addendum)
PROGRESS NOTE  Isaac Hunter FTD:[id number] DOB: [birth year] DOA: 10/05/2022 PCP: [facility]  Brief History:   82 year old male with a history of hypertension, diabetes mellitus type 2, coronary artery disease, tobacco abuse, CKD stage IV presenting with generalized weakness and a fall.  Apparently, the patient was found by his family on the ground after a fall.  He stated that he had slid out of his chair and could not get up.  There is no loss of consciousness.  The patient was not aware of any black tarry stools or hematochezia.  He denied any NSAID use.  Workup in the emergency department revealed the patient had hemoglobin of 4.5.  He had heme positive stools.  He was hemodynamically stable.  GI was consulted to assist with management.  The patient received 3 units PRBC during that hospitalization, last unit on 10/07/2022..  The patient subsequently underwent  EGD 8/30 that only showed gastritis in the posterior wall of the stomach, but no source of bleeding could be found.  Colonoscopy performed 9/2 with two 5-8 mm polyps resected and retrieved, one 12-15 mm polyp resected and retrieved, one 20 mm polyp resected piecemeal and clips placed, two 4-10 mm polyps resected and retrieved.  Also with nonbleeding external and internal hemorrhoids.  Capsule endoscopy was recommended. Unfortunately, patient was not able to swallow the givens capsule.  Multiple discussions with the patient and his son were undertaken by GI and myself.  The patient continued to have hesitation about swelling to Givens capsule.  Fortunately, the patient's hemoglobin remained largely stable without any signs of active blood loss.  On 10/11/2022, the patient's diet was advanced.  Assessment/Plan: Symptomatic Anemia/Heme Positive Stool -presented with Hgb 4.5 -received 3 units PRBC -Hgb remains largely stable without any signs of active blood loss  -EGD and colonoscopy as discussed above -pt  continues to have hesitation to undergo Givens Capsule study -appreciate GI follow up -advance diet and follow Hgb  -continue pantoprazole  Acute on chronic renal failure--CKD 4 -baseline creatinine 2.6-3.0 -serum creatinine peaked 4.73 -improved with PRBC and IVF -now likely at new baseline -no hydronephrosis on CT abd/pelvis on 8/29  Pyuria -UA >50 WBC -initially started on ceftriaxone -Urine culture grew mixed flora -Rocephin was discontinued   Hypertension -Continue Norvasc, Coreg  Mixed hyperlipidemia -continue statin  Thrombocytopenia -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   Objective: Vitals:   10/10/22 1800 10/10/22 1954 10/11/22 0711 10/11/22 1329  BP: (!) 175/65 (!) 157/60 (!) 179/87 (!) 150/63  Pulse:  79 75 77  Resp:  18 20 16   Temp:  97.9 F (36.6 C) 98.5 F (36.9 C) 97.8 F (36.6 C)  TempSrc:  Oral Oral Oral  SpO2:  100% 100% 99%  Weight:      Height:        Intake/Output Summary (Last 24 hours) at 10/11/2022 1712 Last data filed at 10/11/2022 1700 Gross per 24 hour  Intake 1093.07 ml  Output 800 ml  Net 293.07 ml   Weight change:  Exam:  General:  Pt is alert, follows commands appropriately, not in acute distress HEENT: No icterus, No thrush, No neck mass, [place]/AT Cardiovascular: RRR, S1/S2, no rubs, no gallops Respiratory: CTA bilaterally, no wheezing, no crackles, no rhonchi Abdomen: Soft/+BS, non tender, non distended, no guarding Extremities: No edema, No lymphangitis, No petechiae, No rashes, no synovitis   Data Reviewed: I have personally reviewed  following labs and imaging studies Basic Metabolic Panel: Recent Labs  Lab 10/05/22 1952 10/06/22 0538 10/07/22 0514 10/08/22 0514 10/09/22 0518 10/10/22 1217 10/11/22 0450  NA 141 142 142 139 140 141 141  K 4.2 4.1 3.9 3.8 3.5 3.6 3.4*  CL 113* 117* 117* 115* 116* 115* 114*  CO2 19* 18* 17* 18* 19* 18* 16*  GLUCOSE 99 108* 103* 84 78 82 80  BUN 65* 61* 58* 49* 43* 39* 36*  CREATININE 4.73* 4.45* 4.03* 3.76* 3.81* 3.58* 3.52*  CALCIUM 8.2* 8.2* 7.9* 7.6* 7.9* 8.1* 8.0*  MG 2.2 2.1  --   --   --   --   --    Liver Function Tests: Recent Labs  Lab 10/06/22 0538 10/08/22 0514  AST 20 15  ALT 14 12  ALKPHOS 50 43  BILITOT 1.3* 1.0  PROT 6.0* 5.2*  ALBUMIN 2.9* 2.5*   No results for input(s): "LIPASE", "AMYLASE" in the last 168 hours. No results for input(s): "AMMONIA" in the last 168 hours. Coagulation Profile: No results for input(s): "INR", "PROTIME" in the last 168 hours. CBC: Recent Labs  Lab 10/05/22 1952 10/06/22 0538 10/07/22 0514 10/08/22 0514 10/09/22 0518 10/10/22 1217 10/11/22 0450  WBC 5.8   < > 7.2 6.2 5.6 5.5 4.7  NEUTROABS 4.0  --   --   --   --   --   --   HGB 4.5*   < > 7.1* 7.9* 7.4* 8.1* 7.7*  HCT 15.0*   < > 23.2* 25.1* 23.3* 25.7* 24.3*  MCV 80.2   < > 82.9 81.2 81.2 82.6 80.5  PLT 99*   < > 93* 85* 88* 84* 82*   < > = values in this interval not displayed.   Cardiac Enzymes: Recent Labs  Lab 10/05/22 1952  CKTOTAL 272   BNP: Invalid input(s): "POCBNP" CBG: No results for input(s): "GLUCAP" in the last 168 hours. HbA1C: No results for input(s): "HGBA1C" in the last 72 hours. Urine analysis:    Component Value Date/Time   COLORURINE YELLOW 10/05/2022 1935   APPEARANCEUR CLOUDY (A) 10/05/2022 1935   LABSPEC 1.010 10/05/2022 1935   PHURINE 7.0 10/05/2022 1935   GLUCOSEU NEGATIVE 10/05/2022 1935   HGBUR SMALL (A) 10/05/2022 1935   BILIRUBINUR NEGATIVE 10/05/2022 1935   KETONESUR NEGATIVE 10/05/2022 1935   PROTEINUR 100 (A)  10/05/2022 1935   UROBILINOGEN 4.0 (H) 11/17/2010 1248   NITRITE NEGATIVE 10/05/2022 1935   LEUKOCYTESUR LARGE (A) 10/05/2022 1935   Sepsis Labs: @LABRCNTIP (procalcitonin:4,lacticidven:4) ) Recent Results (from the past 240 hour(s))  Urine Culture     Status: Abnormal   Collection Time: 10/05/22  7:35 PM   Specimen: Urine, Clean Catch  Result Value Ref Range Status   Specimen Description   Final    URINE, CLEAN CATCH Performed at [facility], [address] [place], [place] [address]    Special Requests   Final    NONE Performed at [facility], [address] [place], [place] [address]    Culture MULTIPLE SPECIES PRESENT, SUGGEST RECOLLECTION (A)  Final   Report Status 10/07/2022 FINAL  Final     Scheduled Meds:  amLODipine  10 mg Oral Daily   atorvastatin  80 mg Oral Daily   carvedilol  6.25 mg Oral BID WC   nicotine  21 mg Transdermal Daily   pantoprazole (PROTONIX) IV  40 mg Intravenous Q12H   Continuous Infusions:  Procedures/Studies: CT ABDOMEN PELVIS WO CONTRAST  Result Date: 10/06/2022 CLINICAL DATA:  Abdominal pain.  Concern for kidney stone. EXAM: CT ABDOMEN AND PELVIS WITHOUT CONTRAST TECHNIQUE: Multidetector CT imaging of the abdomen and pelvis was performed following the standard protocol without IV contrast. RADIATION DOSE REDUCTION: This exam was performed according to the departmental dose-optimization program which includes automated exposure control, adjustment of the mA and/or kV according to patient size and/or use of iterative reconstruction technique. COMPARISON:  CT abdomen pelvis dated 03/05/2022. FINDINGS: Evaluation of this exam is limited in the absence of intravenous contrast. Lower chest: Partially visualized small right and trace left pleural effusions. There is diffuse interstitial and interlobular septal prominence of the visualized lungs consistent with edema. There is hypoattenuation of the cardiac blood pool suggestive of anemia. Clinical  correlation is recommended. Coronary vascular calcification. No intra-abdominal free air or free fluid. Hepatobiliary: The liver is unremarkable. The gallbladder is unremarkable. Pancreas: Unremarkable. No pancreatic ductal dilatation or surrounding inflammatory changes. Spleen: Normal in size without focal abnormality. Adrenals/Urinary Tract: Bilateral adrenal thickening/hyperplasia or adenoma as seen on the prior CT. Bilateral renal vascular calcifications. Several small nonobstructing renal calculi may be present. There is no hydronephrosis or obstructing stone on either side. The visualized ureters appear unremarkable. There is a small left posterior bladder wall diverticulum. Mild haziness of the bladder wall. Correlation with urinalysis recommended to exclude UTI. Stomach/Bowel: There is no bowel obstruction or active inflammation. The appendix is normal. Vascular/Lymphatic: Advanced aortoiliac atherosclerotic disease. There is a 5 cm fusiform infrarenal abdominal aortic aneurysm. The IVC is unremarkable. No portal venous gas. There is no adenopathy. Reproductive: The prostate and seminal vesicles are grossly unremarkable. No pelvic mass. Other: None Musculoskeletal: There is compression fracture of superior endplate of L1 with approximately 50% loss of vertebral body height and anterior wedging, new since the prior CT. Correlation with clinical exam and point tenderness recommended. There is approximately 7 mm retropulsion of the posterosuperior cortex with mild focal narrowing of the central canal. IMPRESSION: 1. No hydronephrosis or obstructing stone. 2. Mild haziness of the bladder wall. Correlation with urinalysis recommended to exclude UTI. 3. No bowel obstruction. Normal appendix. 4. A 5 cm fusiform infrarenal abdominal aortic aneurysm. Recommend follow-up CT/MR every 6 months and vascular consultation. This recommendation follows ACR consensus guidelines: White Paper of the ACR Incidental Findings  Committee II on Vascular Findings. J Am Coll Radiol 2013; 10:789-794. 5. Compression fracture of superior endplate of L1, new since the prior CT. Correlation with clinical exam and point tenderness recommended. 6. Partially visualized small right and trace left pleural effusions. 7.  Aortic Atherosclerosis (ICD10-I70.0). Electronically Signed   By: Elgie Collard M.D.   On: 10/06/2022 00:00   DG Chest Port 1 View  Result Date: 10/05/2022 CLINICAL DATA:  Anemia. Patient was found on the floor. Generalized weakness. Decreased hemoglobin. History of heart disease, hypertension, diabetes, tobacco abuse. EXAM: PORTABLE CHEST 1 VIEW COMPARISON:  03/05/2022 FINDINGS: Cardiac enlargement with pulmonary vascular congestion. Perihilar and basilar interstitial changes likely represent mild edema. No focal consolidation. No pleural effusions. No pneumothorax. Mediastinal contours appear intact. Calcification of the aorta. IMPRESSION: Cardiac enlargement with pulmonary vascular congestion and  mild interstitial edema. Electronically Signed   By: Burman Nieves M.D.   On: 10/05/2022 23:22    Catarina Hartshorn, DO  Isaac Hunter  If 7PM-7AM, please contact night-coverage [email or web address] Password [email or web address] 10/11/2022, 5:12 PM   LOS: 5 days

## 2022-10-11 NOTE — Progress Notes (Signed)
Subjective: Patient reports feeling okay this morning. He has no GI complaints. No BMs today. He first tells me he is okay with proceeding with EGD for capsule placement then later states he is unsure. Patient with some intermittent confusion this morning during our encounter.   Objective: Vital signs in last 24 hours: Temp:  [97.9 F (36.6 C)-98.5 F (36.9 C)] 98.5 F (36.9 C) (09/04 0711) Pulse Rate:  [75-79] 75 (09/04 0711) Resp:  [18-20] 20 (09/04 0711) BP: (157-184)/(60-92) 179/87 (09/04 0711) SpO2:  [100 %] 100 % (09/04 0711) Last BM Date : 10/10/22 General:   Alert, pleasant Head:  Normocephalic and atraumatic. Eyes:  No icterus, sclera clear. Conjuctiva pink.  Mouth:  Without lesions, mucosa pink and moist.  Heart:  S1, S2 present, no murmurs noted.  Lungs: Clear to auscultation bilaterally, without wheezing, rales, or rhonchi.  Abdomen:  Bowel sounds present, soft, non-tender, non-distended. No HSM or hernias noted. No rebound or guarding. No masses appreciated  Msk:  Symmetrical without gross deformities. Normal posture. Pulses:  Normal pulses noted. Extremities:  Without clubbing or edema. Neurologic:  Alert, intermittent confusion  Skin:  Warm and dry, intact without significant lesions.  Psych:  Alert and cooperative. Normal mood and affect.  Intake/Output from previous day: 09/03 0701 - 09/04 0700 In: 913.1 [P.O.:120; I.V.:793.1] Out: -  Intake/Output this shift: Total I/O In: -  Out: 800 [Urine:800]  Lab Results: Recent Labs    10/09/22 0518 10/10/22 1217 10/11/22 0450  WBC 5.6 5.5 4.7  HGB 7.4* 8.1* 7.7*  HCT 23.3* 25.7* 24.3*  PLT 88* 84* 82*   BMET Recent Labs    10/09/22 0518 10/10/22 1217 10/11/22 0450  NA 140 141 141  K 3.5 3.6 3.4*  CL 116* 115* 114*  CO2 19* 18* 16*  GLUCOSE 78 82 80  BUN 43* 39* 36*  CREATININE 3.81* 3.58* 3.52*  CALCIUM 7.9* 8.1* 8.0*   Assessment: 82 year old male who came to the hospital for evaluation of  anemia and heme positive stool.  No overt GI bleeding but severe anemia with hemoglobin 4.5.  No iron, B12 or folate deficiency though does have history of iron deficiency in the past.  Has received total of 3 units packed red blood cells with last on 8/31.  He underwent EGD on 8/30 that showed gastritis and posterior wall of the stomach, no source of bleeding found.  Colonoscopy performed 9/2 with two 5 to 8 mm polyps, one 12 to 15 mm polyp, 120 mm polyp resected piecemeal and clips placed, two 4 to 10 mm polyps.  Also have a nonbleeding external and internal hemorrhoids.    He was recommended to have repeat colonoscopy in 1 year and proceed with capsule endoscopy yesterday, however patient was not able to swallow the Givens capsule.  It was then recommended for patient to have EGD for Placement of capsule though Patient Refused As He Was Concerned about the Capsule Getting Stuck.  Our Team Spoke with Both the Patient and His Son at length regarding this, However Patient Still Refusing to Have EGD for Capsule Placement. Hgb this morning is down to 7.7. pt was made NPO at midnight in case he decided to proceed with EGD for capsule placement today.   Discussed capsule placement with the patient who states he does not recall trying to attempt to swallow capsule yesterday, however, with further conversation he did recall this and states he does not think he could attempt to swallow the capsule. He is  still uncertain if he wants to proceed with doing capsule study at all at this time.  I also called his son Isaac Hunter who stated he is coming to see the patient around 4 this afternoon and will discuss further with the patient at that time, though the patient does make his own medical decisions  Plan: Monitor for overt GI bleeding Trend H&H Follow pending EGD/Colonoscopy path report  PPI daily  Remain NPO  Pt to consider capsule study and make [place] aware how he wants to proceed    LOS: 5 days    10/11/2022,  10:25 AM  Kenzlie Disch L. Jeanmarie Hubert, MSN, APRN, AGNP-C Adult-Gerontology Nurse Practitioner [facility] at [facility]

## 2022-10-12 ENCOUNTER — Telehealth: Payer: Self-pay | Admitting: Gastroenterology

## 2022-10-12 ENCOUNTER — Encounter (HOSPITAL_COMMUNITY): Payer: Self-pay | Admitting: Gastroenterology

## 2022-10-12 DIAGNOSIS — Z720 Tobacco use: Secondary | ICD-10-CM | POA: Diagnosis not present

## 2022-10-12 DIAGNOSIS — N179 Acute kidney failure, unspecified: Secondary | ICD-10-CM | POA: Diagnosis not present

## 2022-10-12 DIAGNOSIS — D696 Thrombocytopenia, unspecified: Secondary | ICD-10-CM | POA: Diagnosis not present

## 2022-10-12 DIAGNOSIS — R195 Other fecal abnormalities: Secondary | ICD-10-CM | POA: Diagnosis not present

## 2022-10-12 LAB — BASIC METABOLIC PANEL
Anion gap: 8 (ref 5–15)
BUN: 37 mg/dL — ABNORMAL HIGH (ref 8–23)
CO2: 17 mmol/L — ABNORMAL LOW (ref 22–32)
Calcium: 7.9 mg/dL — ABNORMAL LOW (ref 8.9–10.3)
Chloride: 115 mmol/L — ABNORMAL HIGH (ref 98–111)
Creatinine, Ser: 3.54 mg/dL — ABNORMAL HIGH (ref 0.61–1.24)
GFR, Estimated: 16 mL/min — ABNORMAL LOW (ref >60)
Glucose, Bld: 83 mg/dL (ref 70–99)
Potassium: 3.5 mmol/L (ref 3.5–5.1)
Sodium: 140 mmol/L (ref 135–145)

## 2022-10-12 LAB — FOLATE: Folate: 7.9 ng/mL (ref >5.9)

## 2022-10-12 LAB — VITAMIN B12: Vitamin B-12: 558 pg/mL (ref 180–914)

## 2022-10-12 LAB — SURGICAL PATHOLOGY

## 2022-10-12 LAB — MAGNESIUM: Magnesium: 2 mg/dL (ref 1.7–2.4)

## 2022-10-12 MED ORDER — FERROUS SULFATE 325 (65 FE) MG PO TABS
325.0000 mg | ORAL_TABLET | Freq: Every day | ORAL | Status: DC
  Administered 2022-10-12: 325 mg via ORAL
  Filled 2022-10-12: qty 1

## 2022-10-12 MED ORDER — POTASSIUM CHLORIDE CRYS ER 20 MEQ PO TBCR
20.0000 meq | EXTENDED_RELEASE_TABLET | Freq: Once | ORAL | Status: AC
  Administered 2022-10-12: 20 meq via ORAL
  Filled 2022-10-12: qty 1

## 2022-10-12 NOTE — TOC Transition Note (Signed)
Transition of Care [facility]) - CM/SW Discharge Note   Patient Details  Name: Isaac Hunter MRN: [id number] Date of Birth: 05-10-1940  [facility] CM/SW Contact:  Leitha Bleak, RN Phone Number: 10/12/2022, 1:37 PM   Clinical Narrative:   Patient discharging home. PT is recommending HHPT  Son is agreeable and  pick up him up when ready for discharge. They used [facility] in the past. Morrie Sheldon with [facility] accepted. MD ordered.    Final next level of care: Home w Home Health Services Barriers to Discharge: Barriers Resolved   Patient Goals and CMS Choice CMS Medicare.gov Compare Post Acute Care list provided to:: Patient Represenative (must comment) Choice offered to / list presented to : Adult Children  Discharge Placement                 Patient to be transferred to facility by: EMS Name of family member notified: Son Patient and family notified of of transfer: 10/12/22  Discharge Plan and Services Additional resources added to the After Visit Summary for        [facility] Arranged: PT [facility] Agency: [facility] Date HH Agency Contacted: 10/12/22 Time HH Agency Contacted: [phone]    Social Determinants of Health (SDOH) Interventions SDOH Screenings   Food Insecurity: No Food Insecurity (10/06/2022)  Housing: Low Risk  (10/06/2022)  Transportation Needs: No Transportation Needs (10/06/2022)  Utilities: Not At Risk (10/06/2022)  Financial Resource Strain: Low Risk  (08/10/2021)   Received from [facility]  Tobacco Use: High Risk (10/10/2022)    Readmission Risk Interventions    09/02/2021    3:23 PM  Readmission Risk Prevention Plan  Transportation Screening Complete  PCP or Specialist Appt within 5-7 Days Not Complete  Home Care Screening Complete  Medication Review (RN CM) Complete

## 2022-10-12 NOTE — Evaluation (Signed)
Physical Therapy Evaluation Patient Details Name: Isaac Hunter MRN: [id number] DOB: 05/13/1940 Today's Date: 10/12/2022  History of Present Illness  Isaac Hunter is a 82 y.o. male with medical history significant of coronary artery disease, diabetes mellitus type 2, hypertension, tobacco use disorder, presents to the ED with a chief complaint of fall.  Fortunately patient does not remember why he is in the hospital.  I asked him he just says he forgot what they told him.  I asked if he was feeling dizzy or had any falls and he said he did not think so.  According to chart review family went to check on patient and found him on the ground.  He had slid out of his chair and could not get up.  He was down for couple hours but his CPK is normal.  He denies any loss of consciousness.  He does have malodorous urine, and it was recorded that he had had some urinary incontinence while he was on the ground.  Patient tells me he is not in any pain right now.  He does not think he has been bleeding.  I did not ask further about melena and hematochezia because it is clear that he has confused on the details.  Patient has no other complaints at this time.    Clinical Impression  Patient lying in bed on therapist arrival sleeping; he rouses easily and is agreeable to therapist assessment. Patient able to perform supine to sit with HOB slightly elevated and using railing to pull up with upper extremities with CGA for safety; taking extra time due to weakness.  Once sitting on the edge of the bed patient is able to sit with feet on the floor and hands on the bed with good sitting balance; mild flexed trunk.  Sit to stand to RW with min A for initial boost up to standing due to leg weakness.  Patient is then able to walk with RW in room x 10 ft; needs cues for staying in the walker frame as he tends to flex at the trunk and advance the walker too far in front.  Patient returns to bed with CGA.  patient left in bed  with call button in reach; bed alarm set and nursing notified of mobility status. Patient will benefit from continued skilled therapy services during the remainder of his hospital stay and at the next recommended venue of care to address deficits and promote return to optimal function.             If plan is discharge home, recommend the following: A little help with walking and/or transfers;A little help with bathing/dressing/bathroom;Help with stairs or ramp for entrance;Assistance with cooking/housework   Can travel by private vehicle        Equipment Recommendations None recommended by PT  Recommendations for Other Services       Functional Status Assessment Patient has had a recent decline in their functional status and demonstrates the ability to make significant improvements in function in a reasonable and predictable amount of time.     Precautions / Restrictions Precautions Precautions: Fall Restrictions Weight Bearing Restrictions: No      Mobility  Bed Mobility Overal bed mobility: Needs Assistance Bed Mobility: Supine to Sit     Supine to sit: HOB elevated, Contact guard     General bed mobility comments: supine to sit with CGA; takes extra time due to weakness Patient Response: Cooperative  Transfers Overall transfer level: Needs assistance Equipment  used: Rolling walker (2 wheels) Transfers: Sit to/from Stand Sit to Stand: Min assist           General transfer comment: min A for sit to stand    Ambulation/Gait Ambulation/Gait assistance: Editor, commissioning (Feet): 10 Feet Assistive device: Rolling walker (2 wheels) Gait Pattern/deviations: Shuffle, Knee flexed in stance - right, Knee flexed in stance - left, Trunk flexed       General Gait Details: decreased gait speed  Stairs            Wheelchair Mobility     Tilt Bed Tilt Bed Patient Response: Cooperative  Modified Rankin (Stroke Patients Only)       Balance  Overall balance assessment: Needs assistance   Sitting balance-Leahy Scale: Good Sitting balance - Comments: good sitting balance on edge of bed; some trouble scooting forward on bed due to leg weakness   Standing balance support: Reliant on assistive device for balance, During functional activity, Bilateral upper extremity supported Standing balance-Leahy Scale: Fair Standing balance comment: needs RW for support and for balance due to leg weakness; general weakness                             Pertinent Vitals/Pain Pain Assessment Pain Assessment: No/denies pain    Home Living Family/patient expects to be discharged to:: Private residence Living Arrangements: Alone Available Help at Discharge: Family;Available PRN/intermittently Type of Home: House Home Access: Stairs to enter Entrance Stairs-Rails: None Entrance Stairs-Number of Steps: 8   Home Layout: One level Home Equipment: Agricultural consultant (2 wheels);Grab bars - tub/shower;Hand held shower head;Cane - single point      Prior Function Prior Level of Function : Needs assist             Mobility Comments: Household ambulator using SPC most of time, RW PRN; states daughter does grocery shopping ADLs Comments: Independent     Extremity/Trunk Assessment   Upper Extremity Assessment Upper Extremity Assessment: Generalized weakness    Lower Extremity Assessment Lower Extremity Assessment: Generalized weakness    Cervical / Trunk Assessment Cervical / Trunk Assessment: Kyphotic  Communication   Communication Communication: No apparent difficulties  Cognition Arousal: Alert Behavior During Therapy: WFL for tasks assessed/performed Overall Cognitive Status: Within Functional Limits for tasks assessed                                          General Comments      Exercises     Assessment/Plan    PT Assessment Patient needs continued PT services  PT Problem List Decreased  strength;Decreased activity tolerance;Decreased balance;Decreased mobility       PT Treatment Interventions Gait training;DME instruction;Functional mobility training;Therapeutic activities;Therapeutic exercise;Balance training    PT Goals (Current goals can be found in the Care Plan section)  Acute Rehab PT Goals Patient Stated Goal: return home PT Goal Formulation: With patient Time For Goal Achievement: 10/26/22 Potential to Achieve Goals: Good    Frequency Min 2X/week     Co-evaluation               AM-PAC PT "6 Clicks" Mobility  Outcome Measure Help needed turning from your back to your side while in a flat bed without using bedrails?: A Little Help needed moving from lying on your back to sitting on the side of a flat  bed without using bedrails?: A Little Help needed moving to and from a bed to a chair (including a wheelchair)?: A Little Help needed standing up from a chair using your arms (e.g., wheelchair or bedside chair)?: A Little Help needed to walk in hospital room?: A Little Help needed climbing 3-5 steps with a railing? : A Lot 6 Click Score: 17    End of Session   Activity Tolerance: Patient tolerated treatment well Patient left: in bed;with call bell/phone within reach;with bed alarm set Nurse Communication: Mobility status PT Visit Diagnosis: Other abnormalities of gait and mobility (R26.89);Muscle weakness (generalized) (M62.81);History of falling (Z91.81)    Time: 1110-1130 PT Time Calculation (min) (ACUTE ONLY): 20 min   Charges:   PT Evaluation $PT Eval Low Complexity: 1 Low   PT General Charges $$ ACUTE PT VISIT: 1 Visit        11:34 AM, 10/12/22 Anthone Prieur Small Ezequiel Macauley MPT [facility] [place] [phone] [phone]

## 2022-10-12 NOTE — Telephone Encounter (Signed)
Patient needs a hospital follow up in 2 weeks with Toni Amend or [place].

## 2022-10-12 NOTE — Discharge Summary (Signed)
Physician Discharge Summary   Patient: Isaac Hunter MRN: [id number] DOB: 1940-06-20  Admit date:     10/05/2022  Discharge date: 10/12/22  Discharge Physician: Onalee Hua Giovanni Biby   PCP: [facility]   Recommendations at discharge:   Please follow up with primary care provider within 1-2 weeks  Please repeat BMP and CBC in one week      Hospital Course:   82 year old male with a history of hypertension, diabetes mellitus type 2, coronary artery disease, tobacco abuse, CKD stage IV presenting with generalized weakness and a fall.  Apparently, the patient was found by his family on the ground after a fall.  He stated that he had slid out of his chair and could not get up.  There is no loss of consciousness.  The patient was not aware of any black tarry stools or hematochezia.  He denied any NSAID use.  Workup in the emergency department revealed the patient had hemoglobin of 4.5.  He had heme positive stools.  He was hemodynamically stable.  GI was consulted to assist with management.  The patient received 3 units PRBC during that hospitalization, last unit on 10/07/2022..  The patient subsequently underwent  EGD 8/30 that only showed gastritis in the posterior wall of the stomach, but no source of bleeding could be found.  Colonoscopy performed 9/2 with two 5-8 mm polyps resected and retrieved, one 12-15 mm polyp resected and retrieved, one 20 mm polyp resected piecemeal and clips placed, two 4-10 mm polyps resected and retrieved.  Also with nonbleeding external and internal hemorrhoids.  Capsule endoscopy was recommended. Unfortunately, patient was not able to swallow the givens capsule.  Multiple discussions with the patient and his son were undertaken by GI and myself.  The patient continued to have hesitation about swelling to Givens capsule.  Fortunately, the patient's hemoglobin remained largely stable without any signs of active blood loss.  On 10/11/2022, the patient's diet was  advanced which the patient tolerated.  He remained clinically stable without signs of blood loss.  Case was discussed with GI, and pt was cleared for d/c.  They will arrange for follow up in office.  Assessment and Plan: Symptomatic Anemia/Heme Positive Stool -presented with Hgb 4.5 -received 3 units PRBC -Hgb remains largely stable without any signs of active blood loss  -EGD and colonoscopy as discussed above -pt continues to have hesitation to undergo Givens Capsule study -appreciate GI follow up -advance diet and follow Hgb  -continue pantoprazole   Acute on chronic renal failure--CKD 4 -baseline creatinine 2.6-3.0 -serum creatinine peaked 4.73 -improved with PRBC and IVF -now likely at new baseline -no hydronephrosis on CT abd/pelvis on 8/29 -serum creatinine 3.5 range for 3 days prior to d/c-- this is likely his new baseline   Pyuria -UA >50 WBC -initially started on ceftriaxone -Urine culture grew mixed flora -Rocephin was discontinued    Hypertension -Continue Norvasc, Coreg   Mixed hyperlipidemia -continue statin   Thrombocytopenia -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 For BP   aspirin EC 81 MG tablet Take 1 tablet (81 mg total) by mouth daily with breakfast.  atorvastatin 80 MG tablet Commonly known as: LIPITOR Take 1 tablet (80 mg total) by mouth daily.   carvedilol 6.25 MG tablet Commonly known as: Coreg Take 1 tablet (6.25 mg total) by mouth 2 (two) times daily with a meal. For BP   Cholecalciferol 25 MCG (1000 UT) tablet Take 2,000 Units by mouth daily.   FeroSul 325 (65 Fe) MG tablet Generic drug:  ferrous sulfate Take 1 tablet (325 mg total) by mouth daily with breakfast.   furosemide 20 MG tablet Commonly known as: LASIX Take 1 tablet (20 mg total) by mouth every morning.        Follow-up Information     [facility] Follow up.   Contact information: [address] [place] [address] [phone]        [facility] Follow up.   Contact information: [address] [place] [address] [phone]                Discharge Exam: Ceasar Mons Weights   10/05/22 1924 10/05/22 2035  Weight: 62.9 kg 68 kg   HEENT:  [place]/AT, No thrush, no icterus CV:  RRR, no rub, no S3, no S4 Lung:  CTA, no wheeze, no rhonchi Abd:  soft/+BS, NT Ext:  No edema, no lymphangitis, no synovitis, no rash   Condition at discharge: stable  The results of significant diagnostics from this hospitalization (including imaging, microbiology, ancillary and laboratory) are listed below for reference.   Imaging Studies: CT ABDOMEN PELVIS WO CONTRAST  Result Date: 10/06/2022 CLINICAL DATA:  Abdominal pain.  Concern for kidney stone. EXAM: CT ABDOMEN AND PELVIS WITHOUT CONTRAST TECHNIQUE: Multidetector CT imaging of the abdomen and pelvis was performed following the standard protocol without IV contrast. RADIATION DOSE REDUCTION: This exam was performed according to the departmental dose-optimization program which includes automated exposure control, adjustment of the mA and/or kV according to patient size and/or use of iterative reconstruction technique. COMPARISON:  CT abdomen pelvis dated 03/05/2022. FINDINGS: Evaluation of this exam is limited in the absence of intravenous contrast. Lower chest: Partially visualized small right and trace left pleural effusions. There is diffuse interstitial and interlobular septal prominence of the visualized lungs consistent with edema. There is hypoattenuation of the cardiac blood pool suggestive of  anemia. Clinical correlation is recommended. Coronary vascular calcification. No intra-abdominal free air or free fluid. Hepatobiliary: The liver is unremarkable. The gallbladder is unremarkable. Pancreas: Unremarkable. No pancreatic ductal dilatation or surrounding inflammatory changes. Spleen: Normal in size without focal abnormality. Adrenals/Urinary Tract: Bilateral adrenal thickening/hyperplasia or adenoma as seen on the prior CT. Bilateral renal vascular calcifications. Several small nonobstructing renal calculi may be present. There is no hydronephrosis or obstructing stone on either side. The visualized ureters appear unremarkable. There is a small left posterior bladder wall diverticulum. Mild haziness of the bladder wall. Correlation with urinalysis recommended to exclude UTI. Stomach/Bowel: There is no bowel obstruction or active inflammation. The appendix is normal. Vascular/Lymphatic: Advanced aortoiliac atherosclerotic disease. There is a 5 cm fusiform infrarenal abdominal aortic aneurysm. The IVC is unremarkable. No portal venous gas. There is no adenopathy. Reproductive: The prostate and seminal vesicles are grossly unremarkable. No pelvic mass. Other: None Musculoskeletal: There is compression fracture of superior endplate of L1 with approximately 50% loss of vertebral body height and anterior wedging, new since the prior CT. Correlation with clinical exam and point tenderness recommended. There is approximately 7 mm retropulsion of the posterosuperior cortex with mild focal narrowing of the central  canal. IMPRESSION: 1. No hydronephrosis or obstructing stone. 2. Mild haziness of the bladder wall. Correlation with urinalysis recommended to exclude UTI. 3. No bowel obstruction. Normal appendix. 4. A 5 cm fusiform infrarenal abdominal aortic aneurysm. Recommend follow-up CT/MR every 6 months and vascular consultation. This recommendation follows ACR consensus guidelines: White Paper of the ACR  Incidental Findings Committee II on Vascular Findings. J Am Coll Radiol 2013; 10:789-794. 5. Compression fracture of superior endplate of L1, new since the prior CT. Correlation with clinical exam and point tenderness recommended. 6. Partially visualized small right and trace left pleural effusions. 7.  Aortic Atherosclerosis (ICD10-I70.0). Electronically Signed   By: Elgie Collard M.D.   On: 10/06/2022 00:00   DG Chest Port 1 View  Result Date: 10/05/2022 CLINICAL DATA:  Anemia. Patient was found on the floor. Generalized weakness. Decreased hemoglobin. History of heart disease, hypertension, diabetes, tobacco abuse. EXAM: PORTABLE CHEST 1 VIEW COMPARISON:  03/05/2022 FINDINGS: Cardiac enlargement with pulmonary vascular congestion. Perihilar and basilar interstitial changes likely represent mild edema. No focal consolidation. No pleural effusions. No pneumothorax. Mediastinal contours appear intact. Calcification of the aorta. IMPRESSION: Cardiac enlargement with pulmonary vascular congestion and mild interstitial edema. Electronically Signed   By: Burman Nieves M.D.   On: 10/05/2022 23:22    Microbiology: Results for orders placed or performed during the hospital encounter of 10/05/22  Urine Culture     Status: Abnormal   Collection Time: 10/05/22  7:35 PM   Specimen: Urine, Clean Catch  Result Value Ref Range Status   Specimen Description   Final    URINE, CLEAN CATCH Performed at [facility], [address], [place], [place] [address]    Special Requests   Final    NONE Performed at [facility], [address], [place], [place] [address]    Culture MULTIPLE SPECIES PRESENT, SUGGEST RECOLLECTION (A)  Final   Report Status 10/07/2022 FINAL  Final    Labs: CBC: Recent Labs  Lab 10/05/22 1952 10/06/22 0538 10/08/22 0514 10/09/22 0518 10/10/22 1217 10/11/22 0450 10/12/22 0429  WBC 5.8   < > 6.2 5.6 5.5 4.7 4.8  NEUTROABS 4.0  --   --   --   --   --   --   HGB 4.5*   < >  7.9* 7.4* 8.1* 7.7* 7.4*  HCT 15.0*   < > 25.1* 23.3* 25.7* 24.3* 23.8*  MCV 80.2   < > 81.2 81.2 82.6 80.5 81.2  PLT 99*   < > 85* 88* 84* 82* 86*   < > = values in this interval not displayed.   Basic Metabolic Panel: Recent Labs  Lab 10/05/22 1952 10/06/22 0538 10/07/22 0514 10/08/22 0514 10/09/22 0518 10/10/22 1217 10/11/22 0450 10/12/22 0429  NA 141 142   < > 139 140 141 141 140  K 4.2 4.1   < > 3.8 3.5 3.6 3.4* 3.5  CL 113* 117*   < > 115* 116* 115* 114* 115*  CO2 19* 18*   < > 18* 19* 18* 16* 17*  GLUCOSE 99 108*   < > 84 78 82 80 83  BUN 65* 61*   < > 49* 43* 39* 36* 37*  CREATININE 4.73* 4.45*   < > 3.76* 3.81* 3.58* 3.52* 3.54*  CALCIUM 8.2* 8.2*   < > 7.6* 7.9* 8.1* 8.0* 7.9*  MG 2.2 2.1  --   --   --   --   --  2.0   < > =  values in this interval not displayed.   Liver Function Tests: Recent Labs  Lab 10/06/22 0538 10/08/22 0514  AST 20 15  ALT 14 12  ALKPHOS 50 43  BILITOT 1.3* 1.0  PROT 6.0* 5.2*  ALBUMIN 2.9* 2.5*   CBG: No results for input(s): "GLUCAP" in the last 168 hours.  Discharge time spent: greater than 30 minutes.  Signed: Catarina Hartshorn, MD [facility] 10/12/2022

## 2022-10-12 NOTE — Progress Notes (Signed)
12:58: attempted to call son at both numbers listed in the chart, left a message on voicemail, waiting for a call back   13:30: Charge RN called patient's son and left voicemail.

## 2022-10-12 NOTE — Plan of Care (Signed)
  Problem: Acute Rehab PT Goals(only PT should resolve) Goal: Pt Will Go Supine/Side To Sit Outcome: Progressing Flowsheets (Taken 10/12/2022 1134) Pt will go Supine/Side to Sit: with supervision Goal: Patient Will Transfer Sit To/From Stand Outcome: Progressing Flowsheets (Taken 10/12/2022 1134) Patient will transfer sit to/from stand: with contact guard assist Goal: Pt Will Transfer Bed To Chair/Chair To Bed Outcome: Progressing Flowsheets (Taken 10/12/2022 1134) Pt will Transfer Bed to Chair/Chair to Bed: with contact guard assist Goal: Pt Will Ambulate Outcome: Progressing Flowsheets (Taken 10/12/2022 1134) Pt will Ambulate:  50 feet  with rolling walker  with contact guard assist

## 2022-10-12 NOTE — Care Management Important Message (Signed)
Important Message  Patient Details  Name: Isaac Hunter MRN: [id number] Date of Birth: December 05, 1940   Medicare Important Message Given:  Yes (copy mailed to address on file, unable to speak with son Roseanna Rainbow at this time)     Corey Harold 10/12/2022, 4:37 PM

## 2022-10-13 ENCOUNTER — Encounter (INDEPENDENT_AMBULATORY_CARE_PROVIDER_SITE_OTHER): Payer: Self-pay | Admitting: *Deleted

## 2022-10-13 ENCOUNTER — Other Ambulatory Visit: Payer: Self-pay

## 2022-10-13 ENCOUNTER — Encounter (HOSPITAL_COMMUNITY): Payer: Self-pay

## 2022-10-13 ENCOUNTER — Emergency Department (HOSPITAL_COMMUNITY): Payer: 59

## 2022-10-13 ENCOUNTER — Emergency Department (HOSPITAL_COMMUNITY)
Admission: EM | Admit: 2022-10-13 | Discharge: 2022-10-13 | Disposition: A | Discharge status: Home / Self Care | Payer: 59 | Attending: Emergency Medicine | Admitting: Emergency Medicine

## 2022-10-13 DIAGNOSIS — M545 Low back pain, unspecified: Secondary | ICD-10-CM | POA: Insufficient documentation

## 2022-10-13 DIAGNOSIS — Z79899 Other long term (current) drug therapy: Secondary | ICD-10-CM | POA: Diagnosis not present

## 2022-10-13 DIAGNOSIS — Z7982 Long term (current) use of aspirin: Secondary | ICD-10-CM | POA: Insufficient documentation

## 2022-10-13 DIAGNOSIS — I10 Essential (primary) hypertension: Secondary | ICD-10-CM | POA: Insufficient documentation

## 2022-10-13 DIAGNOSIS — W07XXXA Fall from chair, initial encounter: Secondary | ICD-10-CM | POA: Diagnosis not present

## 2022-10-13 DIAGNOSIS — W19XXXA Unspecified fall, initial encounter: Secondary | ICD-10-CM

## 2022-10-13 LAB — CBG MONITORING, ED: Glucose-Capillary: 82 mg/dL (ref 70–99)

## 2022-10-13 NOTE — ED Notes (Signed)
Patient ambulated in room with MD and RN

## 2022-10-13 NOTE — ED Triage Notes (Signed)
BIBA from home c/o generalized back pain after sliding out of chair and having to crawl to bed.  Denies loc, hitting head or blood thinners.  Pain is worse with movement.  A&O x4

## 2022-10-13 NOTE — ED Provider Notes (Signed)
[place] EMERGENCY DEPARTMENT AT [facility] Provider Note   CSN: [id number] Arrival date & time: 10/13/22  1806     History {Add pertinent medical, surgical, social history, OB history to HPI:1} Chief Complaint  Patient presents with   Back Pain    Isaac Hunter is a 82 y.o. male.  Patient slipped out of the chair and initially complained of back pain.  He has a history of hypertension and recent GI bleed   Back Pain      Home Medications Prior to Admission medications   Medication Sig Start Date End Date Taking? Authorizing Provider  amLODipine (NORVASC) 10 MG tablet Take 1 tablet (10 mg total) by mouth daily. For BP 03/07/22 03/07/23  Shon Hale, MD  aspirin EC 81 MG tablet Take 1 tablet (81 mg total) by mouth daily with breakfast. 03/07/22   Mariea Clonts, Courage, MD  atorvastatin (LIPITOR) 80 MG tablet Take 1 tablet (80 mg total) by mouth daily. 09/12/21   Shon Hale, MD  carvedilol (COREG) 6.25 MG tablet Take 1 tablet (6.25 mg total) by mouth 2 (two) times daily with a meal. For BP 03/07/22   Emokpae, Courage, MD  Cholecalciferol 25 MCG (1000 UT) tablet Take 2,000 Units by mouth daily.     [provider]  FEROSUL 325 (65 Fe) MG tablet Take 1 tablet (325 mg total) by mouth daily with breakfast. 03/07/22   Mariea Clonts, Courage, MD  furosemide (LASIX) 20 MG tablet Take 1 tablet (20 mg total) by mouth every morning. 03/07/22   Shon Hale, MD      Allergies    Patient has no known allergies.    Review of Systems   Review of Systems  Musculoskeletal:  Positive for back pain.    Physical Exam Updated Vital Signs BP (!) 174/64   Pulse 81   Temp 98 F (36.7 C)   Resp 20   Wt 68 kg   SpO2 100%   BMI 21.51 kg/m  Physical Exam  ED Results / Procedures / Treatments   Labs (all labs ordered are listed, but only abnormal results are displayed) Labs Reviewed  CBG MONITORING, ED    EKG None  Radiology DG Lumbar Spine  Complete  Result Date: 10/13/2022 CLINICAL DATA:  Fall EXAM: LUMBAR SPINE - COMPLETE 4+ VIEW COMPARISON:  CT abdomen pelvis 10/05/2022 FINDINGS: Osseous structures are diffusely osteopenic. Superior endplate fracture L1 with 50% loss of height anteriorly and mild convex bowing of the posterior vertebral body is unchanged from prior CT examination. Remaining vertebral body height is preserved. No acute fracture or listhesis of the lumbar spine. Intervertebral disc heights are preserved. Aortoiliac vascular calcifications noted. IMPRESSION: 1. Superior endplate fracture L1 with 50% loss of height anteriorly and mild convex bowing of the posterior vertebral body, unchanged from prior CT examination. 2. Osteopenia. 3. Aortic atherosclerosis. Electronically Signed   By: Helyn Numbers M.D.   On: 10/13/2022 22:46    Procedures Procedures  {Document cardiac monitor, telemetry assessment procedure when appropriate:1}  Medications Ordered in ED Medications - No data to display  ED Course/ Medical Decision Making/ A&P   {   Click here for ABCD2, HEART and other calculatorsREFRESH Note before signing :1}                              Medical Decision Making Amount and/or Complexity of Data Reviewed Radiology: ordered.   Patient with fall and no  significant injury  {Document critical care time when appropriate:1} {Document review of labs and clinical decision tools ie heart score, Chads2Vasc2 etc:1}  {Document your independent review of radiology images, and any outside records:1} {Document your discussion with family members, caretakers, and with consultants:1} {Document social determinants of health affecting pt's care:1} {Document your decision making why or why not admission, treatments were needed:1} Final Clinical Impression(s) / ED Diagnoses Final diagnoses:  Fall, initial encounter    Rx / DC Orders ED Discharge Orders     None

## 2022-10-13 NOTE — Discharge Instructions (Signed)
Follow-up with your family doctor next week

## 2022-10-17 ENCOUNTER — Encounter (HOSPITAL_COMMUNITY): Payer: Self-pay | Admitting: Gastroenterology

## 2022-10-17 LAB — CBC
HCT: 23.8 % — ABNORMAL LOW (ref 39.0–52.0)
Hemoglobin: 7.4 g/dL — ABNORMAL LOW (ref 13.0–17.0)
MCH: 25.3 pg — ABNORMAL LOW (ref 26.0–34.0)
MCHC: 31.1 g/dL (ref 30.0–36.0)
MCV: 81.2 fL (ref 80.0–100.0)
Platelets: 86 10*3/uL — ABNORMAL LOW (ref 150–400)
RBC: 2.93 MIL/uL — ABNORMAL LOW (ref 4.22–5.81)
RDW: 15.7 % — ABNORMAL HIGH (ref 11.5–15.5)
WBC: 4.8 10*3/uL (ref 4.0–10.5)
nRBC: 0 % (ref 0.0–0.2)

## 2023-01-17 ENCOUNTER — Inpatient Hospital Stay
Admission: EM | Admit: 2023-01-17 | Discharge: 2023-02-06 | DRG: 314 | Disposition: A | Discharge status: Skilled Nursing Facility | Payer: 59 | Source: Ambulatory Visit | Attending: Internal Medicine | Admitting: Internal Medicine

## 2023-01-17 ENCOUNTER — Other Ambulatory Visit: Payer: Self-pay

## 2023-01-17 ENCOUNTER — Emergency Department: Payer: 59

## 2023-01-17 DIAGNOSIS — R7989 Other specified abnormal findings of blood chemistry: Secondary | ICD-10-CM | POA: Diagnosis present

## 2023-01-17 DIAGNOSIS — I2510 Atherosclerotic heart disease of native coronary artery without angina pectoris: Secondary | ICD-10-CM | POA: Diagnosis present

## 2023-01-17 DIAGNOSIS — R570 Cardiogenic shock: Secondary | ICD-10-CM | POA: Diagnosis present

## 2023-01-17 DIAGNOSIS — Z515 Encounter for palliative care: Secondary | ICD-10-CM

## 2023-01-17 DIAGNOSIS — Z79899 Other long term (current) drug therapy: Secondary | ICD-10-CM

## 2023-01-17 DIAGNOSIS — D61818 Other pancytopenia: Secondary | ICD-10-CM | POA: Diagnosis present

## 2023-01-17 DIAGNOSIS — N185 Chronic kidney disease, stage 5: Secondary | ICD-10-CM | POA: Diagnosis present

## 2023-01-17 DIAGNOSIS — I959 Hypotension, unspecified: Principal | ICD-10-CM | POA: Diagnosis present

## 2023-01-17 DIAGNOSIS — Z7982 Long term (current) use of aspirin: Secondary | ICD-10-CM

## 2023-01-17 DIAGNOSIS — I120 Hypertensive chronic kidney disease with stage 5 chronic kidney disease or end stage renal disease: Secondary | ICD-10-CM | POA: Diagnosis present

## 2023-01-17 DIAGNOSIS — E872 Acidosis, unspecified: Secondary | ICD-10-CM | POA: Diagnosis present

## 2023-01-17 DIAGNOSIS — E785 Hyperlipidemia, unspecified: Secondary | ICD-10-CM | POA: Diagnosis present

## 2023-01-17 DIAGNOSIS — F03918 Unspecified dementia, unspecified severity, with other behavioral disturbance: Secondary | ICD-10-CM | POA: Diagnosis present

## 2023-01-17 DIAGNOSIS — T68XXXA Hypothermia, initial encounter: Principal | ICD-10-CM | POA: Diagnosis present

## 2023-01-17 DIAGNOSIS — N189 Chronic kidney disease, unspecified: Secondary | ICD-10-CM | POA: Diagnosis present

## 2023-01-17 DIAGNOSIS — R001 Bradycardia, unspecified: Secondary | ICD-10-CM | POA: Diagnosis present

## 2023-01-17 DIAGNOSIS — E1122 Type 2 diabetes mellitus with diabetic chronic kidney disease: Secondary | ICD-10-CM | POA: Diagnosis present

## 2023-01-17 DIAGNOSIS — E870 Hyperosmolality and hypernatremia: Secondary | ICD-10-CM | POA: Diagnosis present

## 2023-01-17 DIAGNOSIS — Z66 Do not resuscitate: Secondary | ICD-10-CM | POA: Diagnosis present

## 2023-01-17 DIAGNOSIS — F17210 Nicotine dependence, cigarettes, uncomplicated: Secondary | ICD-10-CM | POA: Diagnosis present

## 2023-01-17 DIAGNOSIS — N184 Chronic kidney disease, stage 4 (severe): Secondary | ICD-10-CM | POA: Diagnosis present

## 2023-01-17 DIAGNOSIS — N179 Acute kidney failure, unspecified: Secondary | ICD-10-CM | POA: Diagnosis present

## 2023-01-17 DIAGNOSIS — I10 Essential (primary) hypertension: Secondary | ICD-10-CM | POA: Diagnosis present

## 2023-01-17 LAB — LACTIC ACID, PLASMA
Lactic Acid, Venous: 1.8 mmol/L (ref 0.5–1.9)
Lactic Acid, Venous: 1.3 mmol/L (ref 0.5–1.9)

## 2023-01-17 LAB — COMPREHENSIVE METABOLIC PANEL
ALT: 118 U/L — ABNORMAL HIGH (ref 0–44)
AST: 123 U/L — ABNORMAL HIGH (ref 15–41)
Albumin: 2.6 g/dL — ABNORMAL LOW (ref 3.5–5.0)
Alkaline Phosphatase: 93 U/L (ref 38–126)
Anion gap: 9 (ref 5–15)
BUN: 93 mg/dL — ABNORMAL HIGH (ref 8–23)
CO2: 21 mmol/L — ABNORMAL LOW (ref 22–32)
Calcium: 8 mg/dL — ABNORMAL LOW (ref 8.9–10.3)
Chloride: 114 mmol/L — ABNORMAL HIGH (ref 98–111)
Creatinine, Ser: 6.51 mg/dL — ABNORMAL HIGH (ref 0.61–1.24)
GFR, Estimated: 8 mL/min — ABNORMAL LOW (ref >60)
Glucose, Bld: 224 mg/dL — ABNORMAL HIGH (ref 70–99)
Potassium: 4.7 mmol/L (ref 3.5–5.1)
Sodium: 144 mmol/L (ref 135–145)
Total Bilirubin: 0.8 mg/dL (ref <1.2)
Total Protein: 5.5 g/dL — ABNORMAL LOW (ref 6.5–8.1)

## 2023-01-17 LAB — PROTIME-INR
INR: 1.2 (ref 0.8–1.2)
Prothrombin Time: 15.7 s — ABNORMAL HIGH (ref 11.4–15.2)

## 2023-01-17 LAB — CBG MONITORING, ED: Glucose-Capillary: 208 mg/dL — ABNORMAL HIGH (ref 70–99)

## 2023-01-17 LAB — CBC WITH DIFFERENTIAL/PLATELET
Abs Immature Granulocytes: 0.01 10*3/uL (ref 0.00–0.07)
Basophils Absolute: 0 10*3/uL (ref 0.0–0.1)
Basophils Relative: 1 %
Eosinophils Absolute: 0 10*3/uL (ref 0.0–0.5)
Eosinophils Relative: 1 %
HCT: 26.9 % — ABNORMAL LOW (ref 39.0–52.0)
Hemoglobin: 8.5 g/dL — ABNORMAL LOW (ref 13.0–17.0)
Immature Granulocytes: 0 %
Lymphocytes Relative: 23 %
Lymphs Abs: 1.1 10*3/uL (ref 0.7–4.0)
MCH: 25.5 pg — ABNORMAL LOW (ref 26.0–34.0)
MCHC: 31.6 g/dL (ref 30.0–36.0)
MCV: 80.8 fL (ref 80.0–100.0)
Monocytes Absolute: 0.4 10*3/uL (ref 0.1–1.0)
Monocytes Relative: 8 %
Neutro Abs: 3.1 10*3/uL (ref 1.7–7.7)
Neutrophils Relative %: 67 %
Platelets: 52 10*3/uL — ABNORMAL LOW (ref 150–400)
RBC: 3.33 MIL/uL — ABNORMAL LOW (ref 4.22–5.81)
RDW: 17.2 % — ABNORMAL HIGH (ref 11.5–15.5)
Smear Review: NORMAL
WBC: 4.6 10*3/uL (ref 4.0–10.5)
nRBC: 0 % (ref 0.0–0.2)

## 2023-01-17 LAB — TROPONIN I (HIGH SENSITIVITY)
Troponin I (High Sensitivity): 30 ng/L — ABNORMAL HIGH (ref <18)
Troponin I (High Sensitivity): 27 ng/L — ABNORMAL HIGH (ref <18)

## 2023-01-17 LAB — APTT: aPTT: 44 s — ABNORMAL HIGH (ref 24–36)

## 2023-01-17 MED ORDER — NOREPINEPHRINE 4 MG/250ML-% IV SOLN
0.0000 ug/min | INTRAVENOUS | Status: DC
  Administered 2023-01-17: 2 ug/min via INTRAVENOUS
  Filled 2023-01-17: qty 250

## 2023-01-17 MED ORDER — ATROPINE SULFATE 1 MG/10ML IJ SOSY
0.4000 mg | PREFILLED_SYRINGE | Freq: Once | INTRAMUSCULAR | Status: AC
  Administered 2023-01-17: 0.4 mg via INTRAVENOUS
  Filled 2023-01-17: qty 10

## 2023-01-17 MED ORDER — CALCIUM GLUCONATE-NACL 1-0.675 GM/50ML-% IV SOLN
1.0000 g | Freq: Once | INTRAVENOUS | Status: AC
  Administered 2023-01-17: 1000 mg via INTRAVENOUS
  Filled 2023-01-17: qty 50

## 2023-01-17 MED ORDER — ATROPINE SULFATE 0.4 MG/ML IV SOLN
0.4000 mg | Freq: Once | INTRAVENOUS | Status: AC
  Administered 2023-01-17: 0.4 mg via INTRAVENOUS
  Filled 2023-01-17: qty 1

## 2023-01-17 NOTE — ED Notes (Addendum)
0.4mg  of atropine given, verbal order by MD Modesto Charon

## 2023-01-17 NOTE — IPAL (Signed)
  Interdisciplinary Goals of Care Family Meeting   Date carried out: 01/17/2023  Location of the meeting: Bedside  Member's involved: Nurse Practitioner and Family Member or next of kin  Durable Power of Attorney or acting medical decision maker: son, Roseanna Rainbow  Discussion: We discussed goals of care for Isaac Hunter. We discussed multiorgan failure including renal failure, Liver injury, significant intermittent bradycardia with episodes of asystole and circulatory shock. The patient has also been declining recently in mentation requiring rehab and then transfer to [facility]. Given his recent difficulty participating in rehab and caring for himself- not wanting to eat or participate in activities- I do not think he is a good candidate for pacemaker placement or hemodialysis. While in the room discussing GOC the patient became asystolic briefly three times. We discussed his overall poor prognosis and HIGH RISK for imminent cardiac arrest and death. I recommended encouraging family visitation and focusing on comfort measures with withdrawal of levophed drip. The patient's other son, Ronnie's brother, is on his way to the hospital. We attempted to reach him by phone but the call was disconnected. I offered to continue this conversation once Ronnie's brother arrives.  Code status: DNR/DNI confirmed  Disposition: Continue current acute care  Time spent for the meeting: 25 minutes    Cecelia Byars Rust-Chester, NP  01/17/2023, 8:14 PM  Cheryll Cockayne Rust-Chester, AGACNP-BC Acute Care Nurse Practitioner [facility]   [phone] / [phone] Please see Amion for details.

## 2023-01-17 NOTE — ED Provider Notes (Addendum)
[facility] Provider Note    Event Date/Time   First MD Initiated Contact with Patient 01/17/23 1648     (approximate)   History   Bradycardia   HPI  Isaac Hunter is a 82 y.o. male   Past medical history of CKD, CAD, diabetes who presents emergency department bradycardic from his renal clinic.  They noted him to be bradycardic and less responsive so sent to the emergency department.  This patient has baseline dementia, not able to offer much of a history.  He is wakeful and follows commands.  He denies any pain or discomfort at this time.  He is bradycardic and irregular ranging from the 20s although into the 40s but normotensive.  He is also hypothermic.  Independent Historian contributed to assessment above: I spoke to his son [name] over the telephone.  He confirmed DNR.       Physical Exam   Triage Vital Signs: ED Triage Vitals  Encounter Vitals Group     BP 01/17/23 1647 (!) 101/50     Systolic BP Percentile --      Diastolic BP Percentile --      Pulse Rate 01/17/23 1647 (!) 36     Resp 01/17/23 1647 18     Temp --      Temp src --      SpO2 01/17/23 1647 98 %     Weight 01/17/23 1648 135 lb (61.2 kg)     Height 01/17/23 1648 5\' 7"  (1.702 m)     Head Circumference --      Peak Flow --      Pain Score 01/17/23 1648 0     Pain Loc --      Pain Education --      Exclude from Growth Chart --     Most recent vital signs: Vitals:   01/17/23 1800 01/17/23 1810  BP: (!) 82/32 (!) 90/57  Pulse: (!) 43 (!) 44  Resp: (!) 8 (!) 9  Temp:    SpO2: 100% 100%    General: Awake, no distress.  CV:  Good peripheral perfusion.  Resp:  Normal effort.  Abd:  No distention.  Other:  Chronically ill-appearing.  Hypothermic 88.  Normotensive.  His pulse rate ranges from the 20s always in the 50s.  His lungs are clear.  He is following commands and moving all extremities.   ED Results / Procedures / Treatments   Labs (all labs ordered  are listed, but only abnormal results are displayed) Labs Reviewed  COMPREHENSIVE METABOLIC PANEL - Abnormal; Notable for the following components:      Result Value   Chloride 114 (*)    CO2 21 (*)    Glucose, Bld 224 (*)    BUN 93 (*)    Creatinine, Ser 6.51 (*)    Calcium 8.0 (*)    Total Protein 5.5 (*)    Albumin 2.6 (*)    AST 123 (*)    ALT 118 (*)    GFR, Estimated 8 (*)    All other components within normal limits  CBC WITH DIFFERENTIAL/PLATELET - Abnormal; Notable for the following components:   RBC 3.33 (*)    Hemoglobin 8.5 (*)    HCT 26.9 (*)    MCH 25.5 (*)    RDW 17.2 (*)    Platelets 52 (*)    All other components within normal limits  CBG MONITORING, ED - Abnormal; Notable for the following components:  Glucose-Capillary 208 (*)    All other components within normal limits  TROPONIN I (HIGH SENSITIVITY) - Abnormal; Notable for the following components:   Troponin I (High Sensitivity) 30 (*)    All other components within normal limits  CULTURE, BLOOD (ROUTINE X 2)  CULTURE, BLOOD (ROUTINE X 2)  LACTIC ACID, PLASMA  LACTIC ACID, PLASMA  PROTIME-INR  APTT  URINALYSIS, W/ REFLEX TO CULTURE (INFECTION SUSPECTED)  TROPONIN I (HIGH SENSITIVITY)      EKG  ED ECG REPORT I, Pilar Jarvis, the attending physician, personally viewed and interpreted this ECG.   Date: 01/17/2023  EKG Time: 1711  Rate: 54  Rhythm: sinus  Axis: nl  Intervals: none  ST&T Change: no stemi     PROCEDURES:  Critical Care performed: Yes, see critical care procedure note(s)  .Critical Care  Performed by: Pilar Jarvis, MD Authorized by: Pilar Jarvis, MD   Critical care provider statement:    Critical care time (minutes):  30   Critical care was time spent personally by me on the following activities:  Development of treatment plan with patient or surrogate, discussions with consultants, evaluation of patient's response to treatment, examination of patient, ordering and  review of laboratory studies, ordering and review of radiographic studies, ordering and performing treatments and interventions, pulse oximetry, re-evaluation of patient's condition and review of old charts    MEDICATIONS ORDERED IN ED: Medications  calcium gluconate 1 g/ 50 mL sodium chloride IVPB (0 mg Intravenous Stopped 01/17/23 1813)  atropine 1 MG/10ML injection 0.4 mg (0.4 mg Intravenous Given 01/17/23 1704)  atropine injection 0.4 mg (0.4 mg Intravenous Given 01/17/23 1816)    External physician / consultants:  I spoke with hospitalist for admit and  regarding care plan for this patient.   IMPRESSION / MDM / ASSESSMENT AND PLAN / ED COURSE  I reviewed the triage vital signs and the nursing notes.                                Patient's presentation is most consistent with acute presentation with potential threat to life or bodily function.  Differential diagnosis includes, but is not limited to, cardiac due to hyperkalemia, AV block, metabolic derangements, and hypothermia due to sepsis, cardiogenic shock   The patient is on the cardiac monitor to evaluate for evidence of arrhythmia and/or significant heart rate changes.  MDM:    This is a patient with bradycardia and hypothermia no other acute medical complaints.  Baseline dementia but appears perhaps less responsive than even his baseline.  He responded well to 0.5 mg of atropine and is now with regular rhythm apparently sinus rhythm in 50s, continues to be normotensive.  He is hypothermic.  There is no obvious focal infectious symptoms.  He is put on a Lawyer.  Labs pending.  Consider hyperkalemia however narrow complex I think less likely but he was given calcium in any case immediately upon presentation.  He has Pads on at all times and is on cardiac monitoring.  I confirmed with the son DNR status.  They are uncertain about procedures such as pacemaker if indicated.  Workup pending, will admit.  -- Fortunately  he dropped back down bradycardic sustained in the 30s or 40s and is now mildly hypotensive in the 90s over 50s to 80s over 50s.  He responded again to a 0.5 mg dose of atropine.  Unfortunately think he is in cardiogenic  shock and will consult with cardiology for the chronotropic/inotrope of choice and start infusion and admit to the ICU.   --- GOC w ICU; CMO; admit hospitalist; palliative consult.       FINAL CLINICAL IMPRESSION(S) / ED DIAGNOSES   Final diagnoses:  Hypothermia, initial encounter  Bradycardia     Rx / DC Orders   ED Discharge Orders     None        Note:  This document was prepared using Dragon voice recognition software and may include unintentional dictation errors.    Pilar Jarvis, MD 01/17/23 1736    Pilar Jarvis, MD 01/17/23 Aldona Lento    Pilar Jarvis, MD 01/17/23 Maureen Chatters    Pilar Jarvis, MD 01/17/23 2124

## 2023-01-17 NOTE — IPAL (Signed)
  Interdisciplinary Goals of Care Family Meeting   Date carried out: 01/17/2023  Location of the meeting: Bedside  Member's involved: Nurse Practitioner and Family Member or next of kin  Durable Power of Attorney or acting medical decision maker: Isaac Hunter & brother Isaac Hunter as well as many other family members including grandchildren.    Discussion: We discussed goals of care for Isaac Hunter. We discussed current multi-organ failure and his poor candidacy for interventions such as pacemaker placement/hemodialysis. Patient has overall grave prognosis with HIGH RISK of imminent cardiac arrest and death. Family is in agreement with plan for comfort measures only, possible hospice if patient is able to survive overnight. All questions and concerns answered at this time.  Code status: DNR/DNI  Disposition: In-patient comfort care  Time spent for the meeting: 15 minutes    Isaac Hock, NP  01/17/2023, 9:23 PM  Isaac Hunter, AGACNP-BC Acute Care Nurse Practitioner  Pulmonary & Critical Care   [phone] / [phone] Please see Amion for details.

## 2023-01-17 NOTE — ED Notes (Signed)
Placed CCM in comfort care profile per Cheryll Cockayne NP

## 2023-01-17 NOTE — ED Notes (Signed)
Pt interacts with me, held my hand, stated he "felt alright." Stated no needs at this time, provided warm blanket for head rest against side rails as he "doesn't need all these pillows."

## 2023-01-17 NOTE — ED Triage Notes (Signed)
Pt arrived via EMS from  [facility] nephrology. Per EMS, pt was confused and lethargic on their arrival. Pt is now A/Ox3 at this time. Pt was found to have a HR in the low 30's. Pt still has a HR in the low 30's at this time.

## 2023-01-17 NOTE — ED Notes (Signed)
Communicated lactic of 4.7 to Pilar Jarvis M.D.

## 2023-01-18 DIAGNOSIS — R570 Cardiogenic shock: Secondary | ICD-10-CM | POA: Diagnosis present

## 2023-01-18 DIAGNOSIS — D61818 Other pancytopenia: Secondary | ICD-10-CM | POA: Diagnosis present

## 2023-01-18 DIAGNOSIS — F03918 Unspecified dementia, unspecified severity, with other behavioral disturbance: Secondary | ICD-10-CM | POA: Diagnosis present

## 2023-01-18 DIAGNOSIS — I959 Hypotension, unspecified: Principal | ICD-10-CM

## 2023-01-18 DIAGNOSIS — N189 Chronic kidney disease, unspecified: Secondary | ICD-10-CM

## 2023-01-18 DIAGNOSIS — I120 Hypertensive chronic kidney disease with stage 5 chronic kidney disease or end stage renal disease: Secondary | ICD-10-CM | POA: Diagnosis present

## 2023-01-18 DIAGNOSIS — F17210 Nicotine dependence, cigarettes, uncomplicated: Secondary | ICD-10-CM | POA: Diagnosis present

## 2023-01-18 DIAGNOSIS — Z515 Encounter for palliative care: Secondary | ICD-10-CM | POA: Diagnosis not present

## 2023-01-18 DIAGNOSIS — T68XXXA Hypothermia, initial encounter: Secondary | ICD-10-CM | POA: Diagnosis present

## 2023-01-18 DIAGNOSIS — Z66 Do not resuscitate: Secondary | ICD-10-CM | POA: Diagnosis present

## 2023-01-18 DIAGNOSIS — R7989 Other specified abnormal findings of blood chemistry: Secondary | ICD-10-CM | POA: Diagnosis present

## 2023-01-18 DIAGNOSIS — R001 Bradycardia, unspecified: Secondary | ICD-10-CM | POA: Diagnosis present

## 2023-01-18 DIAGNOSIS — N179 Acute kidney failure, unspecified: Secondary | ICD-10-CM

## 2023-01-18 DIAGNOSIS — E870 Hyperosmolality and hypernatremia: Secondary | ICD-10-CM | POA: Diagnosis present

## 2023-01-18 DIAGNOSIS — E872 Acidosis, unspecified: Secondary | ICD-10-CM | POA: Diagnosis present

## 2023-01-18 DIAGNOSIS — N185 Chronic kidney disease, stage 5: Secondary | ICD-10-CM | POA: Diagnosis present

## 2023-01-18 DIAGNOSIS — Z79899 Other long term (current) drug therapy: Secondary | ICD-10-CM | POA: Diagnosis not present

## 2023-01-18 DIAGNOSIS — E1122 Type 2 diabetes mellitus with diabetic chronic kidney disease: Secondary | ICD-10-CM | POA: Diagnosis present

## 2023-01-18 DIAGNOSIS — E785 Hyperlipidemia, unspecified: Secondary | ICD-10-CM | POA: Diagnosis present

## 2023-01-18 DIAGNOSIS — I2510 Atherosclerotic heart disease of native coronary artery without angina pectoris: Secondary | ICD-10-CM | POA: Diagnosis present

## 2023-01-18 DIAGNOSIS — Z7982 Long term (current) use of aspirin: Secondary | ICD-10-CM | POA: Diagnosis not present

## 2023-01-18 MED ORDER — ACETAMINOPHEN 650 MG RE SUPP: 650.0000 mg | Freq: Four times a day (QID) | RECTAL | Status: DC | PRN

## 2023-01-18 MED ORDER — GLYCOPYRROLATE 0.2 MG/ML IJ SOLN: 0.2000 mg | INTRAMUSCULAR | Status: DC | PRN

## 2023-01-18 MED ORDER — LORAZEPAM 1 MG PO TABS
1.0000 mg | ORAL_TABLET | ORAL | Status: DC | PRN
  Administered 2023-01-24 – 2023-01-27 (×2): 1 mg via ORAL
  Filled 2023-01-18 (×2): qty 1

## 2023-01-18 MED ORDER — LORAZEPAM 2 MG/ML IJ SOLN
1.0000 mg | INTRAMUSCULAR | Status: DC | PRN
  Filled 2023-01-18: qty 1

## 2023-01-18 MED ORDER — ONDANSETRON HCL 4 MG/2ML IJ SOLN: 4.0000 mg | Freq: Four times a day (QID) | INTRAMUSCULAR | Status: DC | PRN

## 2023-01-18 MED ORDER — MORPHINE SULFATE (PF) 2 MG/ML IV SOLN: 1.0000 mg | INTRAVENOUS | Status: DC | PRN

## 2023-01-18 MED ORDER — HYDROMORPHONE HCL 1 MG/ML IJ SOLN
0.5000 mg | INTRAMUSCULAR | Status: DC | PRN
  Filled 2023-01-18: qty 0.5

## 2023-01-18 MED ORDER — LORAZEPAM 2 MG/ML PO CONC: 1.0000 mg | ORAL | Status: DC | PRN

## 2023-01-18 MED ORDER — GLYCOPYRROLATE 1 MG PO TABS: 1.0000 mg | ORAL_TABLET | ORAL | Status: DC | PRN

## 2023-01-18 MED ORDER — ONDANSETRON 4 MG PO TBDP: 4.0000 mg | ORAL_TABLET | Freq: Four times a day (QID) | ORAL | Status: DC | PRN

## 2023-01-18 MED ORDER — TRAZODONE HCL 50 MG PO TABS
25.0000 mg | ORAL_TABLET | Freq: Every evening | ORAL | Status: DC | PRN
  Filled 2023-01-18: qty 1

## 2023-01-18 MED ORDER — BIOTENE DRY MOUTH MT LIQD: 15.0000 mL | OROMUCOSAL | Status: DC | PRN

## 2023-01-18 MED ORDER — ACETAMINOPHEN 325 MG PO TABS
650.0000 mg | ORAL_TABLET | Freq: Four times a day (QID) | ORAL | Status: DC | PRN
  Filled 2023-01-18: qty 2

## 2023-01-18 MED ORDER — POLYVINYL ALCOHOL 1.4 % OP SOLN: 1.0000 [drp] | Freq: Four times a day (QID) | OPHTHALMIC | Status: DC | PRN

## 2023-01-18 NOTE — ED Notes (Signed)
Messaged MD regarding family's request for a diet order for patient.

## 2023-01-18 NOTE — Consult Note (Addendum)
Consultation Note Date: 01/18/2023 at 0825  Patient Name: Isaac Hunter  DOB: [birth year]  MRN: [id number]  Age / Sex: 82 y.o., male  PCP: [facility] Referring Physician: Gillis Santa, MD  HPI/Patient Profile: 82 y.o. male  with past medical history of type 2 diabetes, tobacco abuse, HLD, CAD, and dementia admitted on 01/17/2023 with bradycardia from his renal clinic at [facility].  While in the ED, patient is experiencing multiorgan failure including renal failure with liver injury, significant intermittent bradycardia with episodes of asystole and circulatory shock.  After discussions with critical care NP Rust-Chester, patient's son [name] and other family decided to shift care to comfort focused measures only.  PMT was consulted to assist with comfort measures.  Clinical Assessment and Goals of Care: Extensive chart review completed prior to meeting patient including labs, vital signs, imaging, progress notes, orders, and available advanced directive documents from current and previous encounters.  I met with patient at bedside.  He was asleep with respirations even and unlabored.  He easily awakens to my presence, acknowledge my presence by saying "come on in", but did not make any other responses to my questions or verbalizations during my visit.  I did not note signs of distress such as brow furrowing, grimacing, or fidgeting.  Patient is unable to participate in goals of care discussions at this time independently.  He was also unable to participate in a pain assessment.  However, patient appears in no apparent distress.   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  I added Dilaudid as needed for increased WOB or air hunger.  Adjustments made to [facility] to reflect comfort measures only.  Patient has not required any as needed medications.  He  appears stable at the moment with current comfort measures.  I think he is appropriate for hospice evaluation while in the ED and could avoid admission.  Conveyed above to attending and RN. Patient appears to be appropriate for discharge with hospice services (not IPU appropriate at this time).  I attempted to speak with patient's son and next of kin decision maker Ronnie over the phone.  No answer.  HIPAA compliant voicemail left.  Received message from RN that family was at bedside.  I came to bedside and met with patient's daughter and son as well as several grandchildren.  Family endorses there are 2 other children that plan to visit with patient this afternoon.  I introduced Palliative Medicine as specialized medical care for people living with serious illness. It focuses on providing relief from the symptoms and stress of a serious illness. The goal is to improve quality of life for both the patient and the family.   I gauged family's understanding of patient's current medical situation.  They endorse that they want to continue with full comfort measures.  They are requesting that he be evaluated for hospice inpatient unit placement.  I discussed different between hospice at home and hospice inpatient unit.  I  shared patient has not required many as needed medications but given his ESRD he may likely pass within 2 weeks or less.  I deferred further discussion of which hospice service patient is eligible for to [facility].  Discussed with family that [facility] would offer choice of hospice services and they would proceed with connecting them with the hospice agency.  I also highlighted that patient could ideally avoid admission to the hospital if he is able to either transfer home with hospice services or to inpatient unit today.  Family was in agreement and hopeful that patient did not have to be admitted only to then turned on to be discharged.  Comfort measures discussed in detail.  Opportunity and space  offered for family to ask questions.  PMT will continue to follow and support patient throughout his hospitalization.  RN and attending notified of need for [facility] to address hospice services.  Primary Decision Maker NEXT OF KIN  Physical Exam Vitals reviewed.  Constitutional:      General: He is not in acute distress.    Appearance: He is normal weight.  HENT:     Head: Normocephalic.     Mouth/Throat:     Mouth: Mucous membranes are moist.  Pulmonary:     Effort: Pulmonary effort is normal.  Abdominal:     Palpations: Abdomen is soft.  Skin:    General: Skin is warm and dry.  Psychiatric:        Mood and Affect: Mood normal.        Behavior: Behavior normal.     Palliative Assessment/Data: 30%     Thank you for this consult. Palliative medicine will continue to follow and assist holistically.   Time Total: 75 minutes  Time spent includes: Detailed review of medical records (labs, imaging, vital signs), medically appropriate exam (mental status, respiratory, cardiac, skin), discussed with treatment team, counseling and educating patient, family and staff, documenting clinical information, medication management and coordination of care.  Signed by: Georgiann Cocker, DNP, FNP-BC Palliative Medicine   Please contact Palliative Medicine Team providers via [facility] for questions and concerns.

## 2023-01-18 NOTE — ED Notes (Signed)
Sharia Reeve, RN informed this Clinical research associate that she gave report to day shift RN.

## 2023-01-18 NOTE — ED Notes (Signed)
Family at bedside. MD notified.

## 2023-01-18 NOTE — Assessment & Plan Note (Signed)
-   As mentioned above this has responded to IV Levophed initially and later the patient was placed on comfort measures.

## 2023-01-18 NOTE — Assessment & Plan Note (Signed)
-   No active management is being given as the patient is on comfort measures.

## 2023-01-18 NOTE — ED Notes (Signed)
Pt cleaned up and readjusted in bed at this time. New brief applied. Pt Alert and oriented to self.

## 2023-01-18 NOTE — ED Notes (Signed)
Report received from [facility], RN and pt care assumed.

## 2023-01-18 NOTE — ED Notes (Signed)
Pt awake and alert in bed at this time. Bed alarm on.

## 2023-01-18 NOTE — H&P (Signed)
[place]   PATIENT NAME: Isaac Hunter    MR#:  [id number]  DATE OF BIRTH:  1940/11/18  DATE OF ADMISSION:  01/17/2023  PRIMARY CARE PHYSICIAN: [facility]   Patient is coming from: Home  REQUESTING/REFERRING PHYSICIAN: Ward, Layla Maw, DO  CHIEF COMPLAINT:   Chief Complaint  Patient presents with   Bradycardia    HISTORY OF PRESENT ILLNESS:  Isaac Hunter is a 82 y.o. male with medical history significant for coronary artery disease, type 2 diabetes mellitus hypertension and tobacco abuse, who presented to the emergency room with bradycardia from his renal clinic with decreased responsiveness.  The patient has a baseline dementia and therefore was a very poor historian.  He was initially following commands.  He denied any chest pain or palpitations, cough or wheezing or dysuria, oliguria or hematuria or flank pain.  He did not have a reported fever or chills.  He was significantly bradycardic initially ranging from the 20s to the 40s but normotensive.  He was also hypothermic and was placed on [employer]  ED Course: When he came to the ER BP was 101/50 with respiratory rate of 18 and heart rate of 36 and temperature 88.3 later on BP was down to 82/32 despite hydration when he was placed on IV Levophed that improved his BP.  His heart rate has improved as well to 52 after atropine. EKG showed sinus bradycardia with rate of 54. Imaging: Portable chest x-ray showed no acute cardiopulmonary disease.  The patient was given 0.4 mg of IV atropine twice and 1 g of IV calcium gluconate with improvement in his heart rate PAST MEDICAL HISTORY:   Past Medical History:  Diagnosis Date   CAD (coronary artery disease)    Non obstructive per cath 2001.   Diabetes mellitus    hx per pt, no longer has?   Hypertension    hx per pt, no long has??   Tobacco abuse 11/17/2010    PAST SURGICAL HISTORY:   Past Surgical History:  Procedure Laterality Date    BIOPSY  10/06/2022   Procedure: BIOPSY;  Surgeon: Dolores Frame, MD;  [facility];  Service: Gastroenterology;;   COLONOSCOPY WITH PROPOFOL N/A 10/09/2022   Procedure: COLONOSCOPY WITH PROPOFOL;  Surgeon: Dolores Frame, MD;  [facility];  Service: Gastroenterology;  Laterality: N/A;   CYSTOSCOPY  11/21/2010   Procedure: CYSTOSCOPY;  Surgeon: Ky Barban;  Location: [facility];  Service: Urology;  Laterality: N/A;   ENDOSCOPIC MUCOSAL RESECTION  10/09/2022   Procedure: ENDOSCOPIC MUCOSAL RESECTION;  Surgeon: Marguerita Merles, Reuel Boom, MD;  [facility];  Service: Gastroenterology;;   ESOPHAGOGASTRODUODENOSCOPY (EGD) WITH PROPOFOL N/A 10/06/2022   Procedure: ESOPHAGOGASTRODUODENOSCOPY (EGD) WITH PROPOFOL;  Surgeon: Dolores Frame, MD;  [facility];  Service: Gastroenterology;  Laterality: N/A;   GIVENS CAPSULE STUDY N/A 10/11/2022   Procedure: GIVENS CAPSULE STUDY;  Surgeon: Dolores Frame, MD;  [facility];  Service: Gastroenterology;  Laterality: N/A;   HEMOSTASIS CLIP PLACEMENT  10/09/2022   Procedure: HEMOSTASIS CLIP PLACEMENT;  Surgeon: Dolores Frame, MD;  [facility];  Service: Gastroenterology;;   Left hand surgery     POLYPECTOMY  10/09/2022   Procedure: POLYPECTOMY;  Surgeon: Dolores Frame, MD;  [facility];  Service: Gastroenterology;;   TRANSURETHRAL RESECTION OF BLADDER TUMOR  11/21/2010   Procedure: TRANSURETHRAL RESECTION OF BLADDER TUMOR (TURBT);  Surgeon:  Ky Barban;  Location: [facility];  Service: Urology;  Laterality: N/A;  evacuation of clots    SOCIAL HISTORY:   Social History   Tobacco Use   Smoking status: Every Day    Current packs/day: 1.00    Average packs/day: 1 pack/day for 50.0 years (50.0 ttl pk-yrs)    Types: Cigarettes   Smokeless tobacco: Never  Substance Use Topics   Alcohol use: No    FAMILY HISTORY:    Family History  Problem Relation Age of Onset   Anesthesia problems Neg Hx    Hypotension Neg Hx    Malignant hyperthermia Neg Hx    Pseudochol deficiency Neg Hx     DRUG ALLERGIES:  No Known Allergies  REVIEW OF SYSTEMS:   ROS As per history of present illness. All pertinent systems were reviewed above. Constitutional, HEENT, cardiovascular, respiratory, GI, GU, musculoskeletal, neuro, psychiatric, endocrine, integumentary and hematologic systems were reviewed and are otherwise negative/unremarkable except for positive findings mentioned above in the HPI.   MEDICATIONS AT HOME:   Prior to Admission medications   Medication Sig Start Date End Date Taking? Authorizing Provider  amLODipine (NORVASC) 10 MG tablet Take 1 tablet (10 mg total) by mouth daily. For BP 03/07/22 03/07/23  Shon Hale, MD  aspirin EC 81 MG tablet Take 1 tablet (81 mg total) by mouth daily with breakfast. 03/07/22   Mariea Clonts, Courage, MD  atorvastatin (LIPITOR) 80 MG tablet Take 1 tablet (80 mg total) by mouth daily. 09/12/21   Shon Hale, MD  carvedilol (COREG) 6.25 MG tablet Take 1 tablet (6.25 mg total) by mouth 2 (two) times daily with a meal. For BP 03/07/22   Emokpae, Courage, MD  Cholecalciferol 25 MCG (1000 UT) tablet Take 2,000 Units by mouth daily.     [provider]  FEROSUL 325 (65 Fe) MG tablet Take 1 tablet (325 mg total) by mouth daily with breakfast. 03/07/22   Mariea Clonts, Courage, MD  furosemide (LASIX) 20 MG tablet Take 1 tablet (20 mg total) by mouth every morning. 03/07/22   Shon Hale, MD      VITAL SIGNS:  Blood pressure (!) 126/104, pulse (!) 42, temperature (!) 88.3 F (31.3 C), temperature source Rectal, resp. rate 16, height 5\' 7"  (1.702 m), weight 61.2 kg, SpO2 (!) 69%.  PHYSICAL EXAMINATION:  Physical Exam  GENERAL:  82 y.o.-year-old African-American patient lying in the bed with no acute distress.  EYES: Pupils equal, round, reactive to light and  accommodation. No scleral icterus. Extraocular muscles intact.  HEENT: Head atraumatic, normocephalic. Oropharynx and nasopharynx clear.  NECK:  Supple, no jugular venous distention. No thyroid enlargement, no tenderness.  LUNGS: Normal breath sounds bilaterally, no wheezing, rales,rhonchi or crepitation. No use of accessory muscles of respiration.  CARDIOVASCULAR: Regular rate and rhythm, S1, S2 normal. No murmurs, rubs, or gallops.  ABDOMEN: Soft, nondistended, nontender. Bowel sounds present. No organomegaly or mass.  EXTREMITIES: No pedal edema, cyanosis, or clubbing.  NEUROLOGIC: Cranial nerves II through XII are intact. Muscle strength 5/5 in all extremities. Sensation intact. Gait not checked.  PSYCHIATRIC: The patient is alert and oriented x 3.  Normal affect and good eye contact. SKIN: No obvious rash, lesion, or ulcer.   LABORATORY PANEL:   CBC Recent Labs  Lab 01/17/23 1650  WBC 4.6  HGB 8.5*  HCT 26.9*  PLT 52*   ------------------------------------------------------------------------------------------------------------------  Chemistries  Recent Labs  Lab 01/17/23 1650  NA 144  K 4.7  CL 114*  CO2  21*  GLUCOSE 224*  BUN 93*  CREATININE 6.51*  CALCIUM 8.0*  AST 123*  ALT 118*  ALKPHOS 93  BILITOT 0.8   ------------------------------------------------------------------------------------------------------------------  Cardiac Enzymes No results for input(s): "TROPONINI" in the last 168 hours. ------------------------------------------------------------------------------------------------------------------  RADIOLOGY:  DG Chest Port 1 View Result Date: 01/17/2023 CLINICAL DATA:  Altered mental status.  Questionable sepsis. EXAM: PORTABLE CHEST 1 VIEW COMPARISON:  10/05/2022. FINDINGS: Bilateral lung fields are clear. Bilateral costophrenic angles are clear. Stable cardio-mediastinal silhouette. No acute osseous abnormalities. The soft tissues are within  normal limits. IMPRESSION: *No active disease. Electronically Signed   By: Jules Schick M.D.   On: 01/17/2023 18:36      IMPRESSION AND PLAN:  Assessment and Plan: * Symptomatic bradycardia - This associated with hypothermia and hypotension is concerning for sepsis.  The patient had 2 brief episodes of what looked like asystole per the ICU team. - The patient was seen by ICU team initially while being placed on IV Levophed and after discussion with his son decision was made to make the patient comfort care and therefore IV Levophed was held off. - The patient maintained his heart rate and blood pressure though. - We will continue for now with comfort measures with as needed IV morphine sulfate and Ativan and symptomatic treatment without any aggressive measures per the patient's family's request. - Palliative care consult to be obtained.  Hypotension - As mentioned above this has responded to IV Levophed initially and later the patient was placed on comfort measures.  Acute kidney injury superimposed on chronic kidney disease (HCC) - No active management is being given as the patient is on comfort measures.       DVT prophylaxis: None. Advanced Care Planning:  Code Status: The patient is DNR and DNI. Family Communication:  The plan of care was discussed in details with the patient (and family). I answered all questions.  The patient's family agreed to proceed with the above mentioned plan. Further management will depend upon hospital course. Disposition Plan: Back to previous home environment Consults called: Palliative care. All the records are reviewed and case discussed with ED provider.  Status is: Inpatient  I certify that at the time of admission, it is my clinical judgment that the patient will require inpatient hospital care extending more than 2 midnights.                            Dispo: The patient is from: Home              Anticipated d/c is to: Home               Patient currently is not medically stable to d/c.              Difficult to place patient: No  Hannah Beat M.D on 01/18/2023 at 8:31 AM  [facility]   From 7 PM-7 AM, contact night-coverage [email or web address]  CC: Primary care physician; [facility] [employer]

## 2023-01-18 NOTE — Plan of Care (Signed)
  Problem: Education: Goal: Knowledge of the prescribed therapeutic regimen will improve Outcome: Progressing   Problem: Clinical Measurements: Goal: Quality of life will improve Outcome: Progressing   Problem: Role Relationship: Goal: Family's ability to cope with current situation will improve Outcome: Progressing   Problem: Clinical Measurements: Goal: Ability to maintain clinical measurements within normal limits will improve Outcome: Progressing   Problem: Safety: Goal: Ability to remain free from injury will improve Outcome: Progressing

## 2023-01-18 NOTE — ED Notes (Signed)
Pt in left side-lying position on stretcher and is sleeping. Occasionally he snores but respirations are even and unlabored. Continue to monitor.

## 2023-01-18 NOTE — Plan of Care (Signed)
Patient was seen and examined at bedside.  Laying comfortably, without any acute distress. Palliative care consult appreciated, patient will be transferred to hospice care when bed will be available.  Patient did not qualify for inpatient hospice management.  TOC working for SNF placement with hospice care. We will continue current comfort measure treatment.

## 2023-01-18 NOTE — Progress Notes (Addendum)
[facility] room ED 19A- [facility]  Received request from Transitions of Care Manger for family interest in [facility]. Spoke with patient's son to  confirm interest and explain services. Patient is not appropriate for admission to the Hospice Home at this time.  Spoke with son about Hospice follow up at home, but son stated that the patient had been living alone, and there was no caregiver to look out for him.  This information was shared with the [facility] and hospital team.   Please call with any Hospice related questions or concerns  Thank you for the opportunity to participate in this patient's care.  Redge Gainer,  [facility] Liaison [phone]

## 2023-01-18 NOTE — Assessment & Plan Note (Addendum)
-   This associated with hypothermia and hypotension is concerning for sepsis.  The patient had 2 brief episodes of what looked like asystole per the ICU team. - The patient was seen by ICU team initially while being placed on IV Levophed and after discussion with his son decision was made to make the patient comfort care and therefore IV Levophed was held off. - The patient maintained his heart rate and blood pressure though. - We will continue for now with comfort measures with as needed IV morphine sulfate and Ativan and symptomatic treatment without any aggressive measures per the patient's family's request. - Palliative care consult to be obtained.

## 2023-01-19 DIAGNOSIS — Z515 Encounter for palliative care: Secondary | ICD-10-CM | POA: Diagnosis not present

## 2023-01-19 DIAGNOSIS — T68XXXA Hypothermia, initial encounter: Secondary | ICD-10-CM | POA: Diagnosis not present

## 2023-01-19 DIAGNOSIS — R001 Bradycardia, unspecified: Secondary | ICD-10-CM | POA: Diagnosis not present

## 2023-01-19 NOTE — Progress Notes (Signed)
[facility] Progress Note  Patient: Isaac Hunter    ZOX:[id number]  DOA: 01/17/2023     Date of Service: the patient was seen and examined on 01/19/2023  Chief Complaint  Patient presents with   Bradycardia   Brief hospital course: Isaac Hunter is a 82 y.o. male with PMH of CAD, HTN,  type 2 diabetes mellitus, and tobacco abuse, who presented to the emergency room with bradycardia from his renal clinic with decreased responsiveness.  The patient has a baseline dementia and therefore was a very poor historian.  He was initially following commands.  He denied any chest pain or palpitations, cough or wheezing or dysuria, oliguria or hematuria or flank pain.  He did not have a reported fever or chills.  He was significantly bradycardic initially ranging from the 20s to the 40s but normotensive.  He was also hypothermic and was placed on [employer]   ED Course: When he came to the ER BP was 101/50 with respiratory rate of 18 and heart rate of 36 and temperature 88.3 later on BP was down to 82/32 despite hydration when he was placed on IV Levophed that improved his BP.  His heart rate has improved as well to 52 after atropine. EKG showed sinus bradycardia with rate of 54. Imaging: Portable chest x-ray showed no acute cardiopulmonary disease.   The patient was given 0.4 mg of IV atropine twice and 1 g of IV calcium gluconate with improvement in his heart rate   Assessment and Plan:  # Symptomatic bradycardia Associated with hypothermia and hypotension was concerning for sepsis.  The patient had 2 brief episodes of what looked like asystole per the ICU team. The patient was seen by ICU team initially while being placed on IV Levophed and after discussion with his son decision was made to make the patient comfort care and therefore IV Levophed was held off. - The patient maintained his heart rate and blood pressure though. - We will continue for now with comfort measures with as  needed IV morphine sulfate and Ativan and symptomatic treatment without any aggressive measures per the patient's family's request. - Palliative care consult to be obtained.   Hypotension - As mentioned above this has responded to IV Levophed initially and later the patient was placed on comfort measures.   Acute kidney injury superimposed on chronic kidney disease (HCC) - No active management is being given as the patient is on comfort measures.  Body mass index is 21.14 kg/m.  Interventions:  Diet: Soft diet DVT Prophylaxis: Comfort measures  Advance goals of care discussion: DNR-comfort care  Family Communication: family was not present at bedside, at the time of interview.  AAO x 1   Disposition:  Pt is from Home, admitted with bradycardia, hypothermia and hypotension, transition to comfort measures only.  For hospice placement. Discharge to hospice facility, when bed will be available. Follow TOC   Subjective: Significant overnight events, patient resting comfortably, denied any complaints.  Physical Exam: General: NAD, lying comfortably Appear in no distress, affect appropriate Eyes: PERRLA ENT: Oral Mucosa Clear, moist  Neck: no JVD,  Cardiovascular: S1 and S2 Present, no Murmur, bradycardia Respiratory: good respiratory effort, Bilateral Air entry equal and Decreased, no Crackles, no wheezes Abdomen: Bowel Sound present, Soft and no tenderness,  Skin: no rashes Extremities: no Pedal edema, no calf tenderness Neurologic: without any new focal findings Gait not checked due to patient safety concerns  Vitals:   01/18/23 1131 01/18/23 1423  01/18/23 1550 01/18/23 1843  BP: 110/70 (!) 111/50  (!) 143/83  Pulse: 76 62 62 66  Resp: 16 18 12 16   Temp:    (!) 97.5 F (36.4 C)  TempSrc:    Oral  SpO2: 100% 100% 100% 100%  Weight:      Height:       No intake or output data in the 24 hours ending 01/19/23 1322 Filed Weights   01/17/23 1648  Weight: 61.2 kg     Data Reviewed: I have personally reviewed and interpreted daily labs, tele strips, imagings as discussed above. I reviewed all nursing notes, pharmacy notes, vitals, pertinent old records I have discussed plan of care as described above with RN and patient/family.  CBC: Recent Labs  Lab 01/17/23 1650  WBC 4.6  NEUTROABS 3.1  HGB 8.5*  HCT 26.9*  MCV 80.8  PLT 52*   Basic Metabolic Panel: Recent Labs  Lab 01/17/23 1650  NA 144  K 4.7  CL 114*  CO2 21*  GLUCOSE 224*  BUN 93*  CREATININE 6.51*  CALCIUM 8.0*    Studies: No results found.  Scheduled Meds: Continuous Infusions: PRN Meds: acetaminophen **OR** acetaminophen, antiseptic oral rinse, glycopyrrolate **OR** glycopyrrolate **OR** glycopyrrolate, HYDROmorphone (DILAUDID) injection, LORazepam **OR** LORazepam **OR** LORazepam, ondansetron **OR** ondansetron (ZOFRAN) IV, polyvinyl alcohol, traZODone  Time spent: 35 minutes  Author: Gillis Santa. MD [facility] 01/19/2023 1:22 PM  To reach On-call, see care teams to locate the attending and reach out to them via [email or web address]. If 7PM-7AM, please contact night-coverage If you still have difficulty reaching the attending provider, please page the [facility] (Director on Call) for [facility] on amion for assistance.

## 2023-01-19 NOTE — Progress Notes (Addendum)
SLP Cancellation Note  Patient Details Name: Isaac Hunter MRN: [id number] DOB: [birth year]   Cancelled treatment:       Reason Eval/Treat Not Completed:  (chart reviewed; consulted NSG/MD via secure chat. Discussed pt's status w/ Palliative Care yesterday.)  Per Palliative Care discussion and note on 01/18/23 re: pt and Family meeting, "They endorse that they want to continue with full comfort measures" w/ pt. Per chart notes and Team, pt is full Comfort Care and transferring to Hospice care today.  ST services will sign off at this time. NSG/Team updated, agreed.      Jerilynn Som, MS, CCC-SLP Speech Language Pathologist Rehab Services; [facility] [phone] (ascom) Nikya Busler 01/19/2023, 8:18 AM

## 2023-01-19 NOTE — Progress Notes (Signed)
                                                     Palliative Care Progress Note, Assessment & Plan   Patient Name: Isaac Hunter       Date: 01/19/2023 DOB: Jan 17, 1941  Age: 82 y.o. MRN#: [id number] Attending Physician: Gillis Santa, MD Primary Care Physician: [facility] Admit Date: 01/17/2023  Subjective: Patient is lying in bed on his left side, sleeping in no apparent distress.  Respirations are even and unlabored.  His no signs of nonverbal pain noted such as brow furrowing or grimacing.  No family or friends present during my visit.  HPI: 82 y.o. male  with past medical history of type 2 diabetes, tobacco abuse, HLD, CAD, and dementia admitted on 01/17/2023 with bradycardia from his renal clinic at [facility].   While in the ED, patient is experiencing multiorgan failure including renal failure with liver injury, significant intermittent bradycardia with episodes of asystole and circulatory shock.   After discussions with critical care NP [place], patient's son [name] and other family decided to shift care to comfort focused measures only.   PMT was consulted to assist with comfort measures.  Summary of counseling/coordination of care: Extensive chart review completed prior to meeting patient including labs, vital signs, imaging, progress notes, orders, and available advanced directive documents from current and previous encounters.   After reviewing the patient's chart and assessing the patient at bedside, I counseled with dayshift RN at bedside.  Discussed symptom management.  Meds are appropriately managing patient's symptoms at this time.  No adjustment to [facility] needed.  Full comfort measures continue.  TOC following closely for discharge placement.  PMT will continue to follow and support patient throughout  hospitalization.  Physical Exam Vitals reviewed.  Constitutional:      General: He is not in acute distress.    Appearance: He is normal weight.  HENT:     Head: Normocephalic.     Mouth/Throat:     Mouth: Mucous membranes are moist.  Pulmonary:     Effort: Pulmonary effort is normal.  Abdominal:     Palpations: Abdomen is soft.  Skin:    General: Skin is warm and dry.  Psychiatric:        Mood and Affect: Mood normal.        Behavior: Behavior normal.        Thought Content: Thought content normal.        Judgment: Judgment normal.             Total Time 25 minutes   Time spent includes: Detailed review of medical records (labs, imaging, vital signs), medically appropriate exam (mental status, respiratory, cardiac, skin), discussed with treatment team, counseling and educating patient, family and staff, documenting clinical information, medication management and coordination of care.  Samara Deist L. Bonita Quin, DNP, FNP-BC Palliative Medicine Team

## 2023-01-19 NOTE — Plan of Care (Signed)
  Problem: Respiratory: Goal: Verbalizations of increased ease of respirations will increase Outcome: Progressing   Problem: Pain Management: Goal: Satisfaction with pain management regimen will improve Outcome: Progressing   Problem: Nutrition: Goal: Adequate nutrition will be maintained Outcome: Progressing   Problem: Elimination: Goal: Will not experience complications related to bowel motility Outcome: Progressing Goal: Will not experience complications related to urinary retention Outcome: Progressing   Problem: Safety: Goal: Ability to remain free from injury will improve Outcome: Progressing   Problem: Skin Integrity: Goal: Risk for impaired skin integrity will decrease Outcome: Progressing

## 2023-01-20 DIAGNOSIS — T68XXXA Hypothermia, initial encounter: Secondary | ICD-10-CM

## 2023-01-20 DIAGNOSIS — Z515 Encounter for palliative care: Secondary | ICD-10-CM | POA: Diagnosis not present

## 2023-01-20 DIAGNOSIS — R001 Bradycardia, unspecified: Secondary | ICD-10-CM | POA: Diagnosis not present

## 2023-01-20 DIAGNOSIS — N179 Acute kidney failure, unspecified: Secondary | ICD-10-CM | POA: Diagnosis not present

## 2023-01-20 NOTE — Progress Notes (Signed)
[facility] Liaison Note  Patient not appropriate for admission to the Hospice Home at this time.  Hospital Liaison will follow peripherally while awaiting a discharge disposition.     Please don't hesitate to call with any Hospice related questions or concerns.    Thank you for the opportunity to participate in this patient's care.  [facility] Liaison [phone]

## 2023-01-20 NOTE — Progress Notes (Signed)
                                                                                                                                                                                                           Daily Progress Note   Patient Name: Isaac Hunter       Date: 01/20/2023 DOB: 03-10-1940  Age: 82 y.o. MRN#: [id number] Attending Physician: Lurene Shadow, MD Primary Care Physician: Inc, [facility] Admit Date: 01/17/2023  Reason for Consultation/Follow-up: Establishing goals of care  HPI/Brief Hospital Review: 82 y.o. male  with past medical history of type 2 diabetes, tobacco abuse, HLD, CAD, and dementia admitted on 01/17/2023 with bradycardia from his renal clinic at [facility].   While in the ED, patient is experiencing multiorgan failure including renal failure with liver injury, significant intermittent bradycardia with episodes of asystole and circulatory shock.   After discussions with critical care NP Rust-Chester, patient's son [name] and other family decided to shift care to comfort focused measures only.   PMT was consulted to assist with comfort measures.   Subjective: Extensive chart review has been completed prior to meeting patient including labs, vital signs, imaging, progress notes, orders, and available advanced directive documents from current and previous encounters.    Visited with Isaac Hunter at his bedside. He is resting in bed, acknowledges my presence in room and denies acute pain or discomfort. Easily drifts back off to sleep without redirection. Continue with current medication regimen.  TOC actively engage in assisting with disposition planning. Likely to need LTC placement with hospice following. Low symptom burden.   Thank you for allowing the Palliative Medicine Team to assist in the care of this patient.  Total time:  25 minutes  Time spent includes: Detailed review of medical records (labs, imaging, vital signs), medically appropriate  exam (mental status, respiratory, cardiac, skin), discussed with treatment team, counseling and educating patient, family and staff, documenting clinical information, medication management and coordination of care.  Leeanne Deed, DNP, AGNP-C Palliative Medicine   Please contact Palliative Medicine Team phone at [phone] for questions and concerns.

## 2023-01-20 NOTE — Plan of Care (Signed)
  Problem: Education: Goal: Knowledge of the prescribed therapeutic regimen will improve Outcome: Progressing   Problem: Clinical Measurements: Goal: Quality of life will improve Outcome: Progressing   Problem: Respiratory: Goal: Verbalizations of increased ease of respirations will increase Outcome: Progressing   Problem: Pain Management: Goal: Satisfaction with pain management regimen will improve Outcome: Progressing   Problem: Clinical Measurements: Goal: Will remain free from infection Outcome: Progressing Goal: Respiratory complications will improve Outcome: Progressing Goal: Cardiovascular complication will be avoided Outcome: Progressing   Problem: Coping: Goal: Level of anxiety will decrease Outcome: Progressing

## 2023-01-20 NOTE — Progress Notes (Signed)
Progress Note    Isaac Hunter  OZH:[id number] DOB: Oct 09, 1940  DOA: 01/17/2023 PCP: Inc, [facility]      Brief Narrative:    Medical records reviewed and are as summarized below:  Isaac Hunter is a 82 y.o. male with PMH of CAD, HTN,  type 2 diabetes mellitus, and tobacco abuse, dementia, who presented to the emergency room, from [facility] nephrology clinic, because of bradycardia and decreased responsiveness.  Reportedly, heart rate was between 20s and 40s.  He was also hypothermic (88.3 F) and hypotensive (82/32) in the ED.  He was admitted to the hospital for symptomatic bradycardia, hypotension, and AKI.  He was treated with IV fluids IV Levophed infusion.   Assessment/Plan:   Principal Problem:   Symptomatic bradycardia Active Problems:   Hypotension   Acute kidney injury superimposed on chronic kidney disease (HCC)   Body mass index is 21.14 kg/m.   Symptomatic bradycardia, hypothermia and hypotension: Patient has been transitioned to comfort care.  AKI on CKD stage V  Coronary artery disease  Dementia   PLAN  Continue comfort measures. Awaiting placement to long-term care facility. Follow-up with hospice team and transition of care team to assist with disposition.    Diet Order             DIET SOFT Room service appropriate? Yes; Fluid consistency: Thin  Diet effective now                            Consultants: Palliative care and hospice  Procedures: None    Medications:    Continuous Infusions:   Anti-infectives (From admission, onward)    None              Family Communication/Anticipated D/C date and plan/Code Status   DVT prophylaxis:      Code Status: Do not attempt resuscitation (DNR) - Comfort care  Family Communication: None Disposition Plan: Plan to discharge to long-term care facility   Status is: Inpatient Remains inpatient appropriate because: Awaiting  placement       Subjective:   Interval events noted.  He is confused and cannot provide any history.  Objective:    Vitals:   01/18/23 1423 01/18/23 1550 01/18/23 1843 01/20/23 0815  BP: (!) 111/50  (!) 143/83 129/60  Pulse: 62 62 66 66  Resp: 18 12 16 18   Temp:   (!) 97.5 F (36.4 C) 97.9 F (36.6 C)  TempSrc:   Oral   SpO2: 100% 100% 100% 100%  Weight:      Height:       No data found.   Intake/Output Summary (Last 24 hours) at 01/20/2023 1438 Last data filed at 01/20/2023 1227 Gross per 24 hour  Intake 800 ml  Output 800 ml  Net 0 ml   Filed Weights   01/17/23 1648  Weight: 61.2 kg    Exam:  GEN: NAD SKIN: Warm and dry EYES: EOMI ENT: MMM CV: RRR PULM: CTA B ABD: soft, ND, NT, +BS CNS: AAO x 1 (person), non focal EXT: No edema or tenderness        Data Reviewed:   I have personally reviewed following labs and imaging studies:  Labs: Labs show the following:   Basic Metabolic Panel: Recent Labs  Lab 01/17/23 1650  NA 144  K 4.7  CL 114*  CO2 21*  GLUCOSE 224*  BUN 93*  CREATININE 6.51*  CALCIUM  8.0*   GFR Estimated Creatinine Clearance: 7.4 mL/min (A) (by C-G formula based on SCr of 6.51 mg/dL (H)). Liver Function Tests: Recent Labs  Lab 01/17/23 1650  AST 123*  ALT 118*  ALKPHOS 93  BILITOT 0.8  PROT 5.5*  ALBUMIN 2.6*   No results for input(s): "LIPASE", "AMYLASE" in the last 168 hours. No results for input(s): "AMMONIA" in the last 168 hours. Coagulation profile Recent Labs  Lab 01/17/23 1759  INR 1.2    CBC: Recent Labs  Lab 01/17/23 1650  WBC 4.6  NEUTROABS 3.1  HGB 8.5*  HCT 26.9*  MCV 80.8  PLT 52*   Cardiac Enzymes: No results for input(s): "CKTOTAL", "CKMB", "CKMBINDEX", "TROPONINI" in the last 168 hours. BNP (last 3 results) No results for input(s): "PROBNP" in the last 8760 hours. CBG: Recent Labs  Lab 01/17/23 1744  GLUCAP 208*   D-Dimer: No results for input(s): "DDIMER" in the  last 72 hours. Hgb A1c: No results for input(s): "HGBA1C" in the last 72 hours. Lipid Profile: No results for input(s): "CHOL", "HDL", "LDLCALC", "TRIG", "CHOLHDL", "LDLDIRECT" in the last 72 hours. Thyroid function studies: No results for input(s): "TSH", "T4TOTAL", "T3FREE", "THYROIDAB" in the last 72 hours.  Invalid input(s): "FREET3" Anemia work up: No results for input(s): "VITAMINB12", "FOLATE", "FERRITIN", "TIBC", "IRON", "RETICCTPCT" in the last 72 hours. Sepsis Labs: Recent Labs  Lab 01/17/23 1650 01/17/23 1942  WBC 4.6  --   LATICACIDVEN 1.8 1.3    Microbiology Recent Results (from the past 240 hours)  Blood Culture (routine x 2)     Status: None (Preliminary result)   Collection Time: 01/17/23  5:59 PM   Specimen: BLOOD  Result Value Ref Range Status   Specimen Description BLOOD BLOOD RIGHT ARM  Final   Special Requests   Final    BOTTLES DRAWN AEROBIC AND ANAEROBIC Blood Culture results may not be optimal due to an inadequate volume of blood received in culture bottles   Culture   Final    NO GROWTH 3 DAYS Performed at [facility], [address] [place], [place] [address]    Report Status PENDING  Incomplete  Blood Culture (routine x 2)     Status: None (Preliminary result)   Collection Time: 01/17/23  5:59 PM   Specimen: BLOOD  Result Value Ref Range Status   Specimen Description BLOOD BLOOD LEFT ARM  Final   Special Requests   Final    BOTTLES DRAWN AEROBIC AND ANAEROBIC Blood Culture adequate volume   Culture   Final    NO GROWTH 3 DAYS Performed at [facility], [address] [place], [place] [address]    Report Status PENDING  Incomplete    Procedures and diagnostic studies:  No results found.             LOS: 2 days   Catalia Massett  [facility]   Pager on [email or web address]. If 7PM-7AM, please contact night-coverage at [email or web address]     01/20/2023, 2:39 PM

## 2023-01-20 NOTE — Plan of Care (Addendum)
  Patient is alert and oriented.denies any pain.   Problem: Education: Goal: Knowledge of the prescribed therapeutic regimen will improve Outcome: Progressing   Problem: Coping: Goal: Ability to identify and develop effective coping behavior will improve Outcome: Progressing   Problem: Clinical Measurements: Goal: Quality of life will improve Outcome: Progressing   Problem: Respiratory: Goal: Verbalizations of increased ease of respirations will increase Outcome: Progressing   Problem: Role Relationship: Goal: Family's ability to cope with current situation will improve Outcome: Progressing Goal: Ability to verbalize concerns, feelings, and thoughts to partner or family member will improve Outcome: Progressing   Problem: Pain Management: Goal: Satisfaction with pain management regimen will improve Outcome: Progressing   Problem: Education: Goal: Knowledge of General Education information will improve Description: Including pain rating scale, medication(s)/side effects and non-pharmacologic comfort measures Outcome: Progressing   Problem: Health Behavior/Discharge Planning: Goal: Ability to manage health-related needs will improve Outcome: Progressing   Problem: Clinical Measurements: Goal: Ability to maintain clinical measurements within normal limits will improve Outcome: Progressing Goal: Will remain free from infection Outcome: Progressing Goal: Diagnostic test results will improve Outcome: Progressing Goal: Respiratory complications will improve Outcome: Progressing Goal: Cardiovascular complication will be avoided Outcome: Progressing   Problem: Activity: Goal: Risk for activity intolerance will decrease Outcome: Progressing   Problem: Nutrition: Goal: Adequate nutrition will be maintained Outcome: Progressing   Problem: Coping: Goal: Level of anxiety will decrease Outcome: Progressing   Problem: Elimination: Goal: Will not experience complications  related to bowel motility Outcome: Progressing Goal: Will not experience complications related to urinary retention Outcome: Progressing   Problem: Pain Management: Goal: General experience of comfort will improve Outcome: Progressing   Problem: Safety: Goal: Ability to remain free from injury will improve Outcome: Progressing   Problem: Skin Integrity: Goal: Risk for impaired skin integrity will decrease Outcome: Progressing

## 2023-01-21 DIAGNOSIS — N179 Acute kidney failure, unspecified: Secondary | ICD-10-CM | POA: Diagnosis not present

## 2023-01-21 DIAGNOSIS — R001 Bradycardia, unspecified: Secondary | ICD-10-CM | POA: Diagnosis not present

## 2023-01-21 DIAGNOSIS — Z515 Encounter for palliative care: Secondary | ICD-10-CM | POA: Diagnosis not present

## 2023-01-21 DIAGNOSIS — N189 Chronic kidney disease, unspecified: Secondary | ICD-10-CM | POA: Diagnosis not present

## 2023-01-21 NOTE — Plan of Care (Signed)
  Problem: Education: Goal: Knowledge of the prescribed therapeutic regimen will improve Outcome: Progressing   Problem: Coping: Goal: Ability to identify and develop effective coping behavior will improve Outcome: Progressing   Problem: Clinical Measurements: Goal: Quality of life will improve Outcome: Progressing

## 2023-01-21 NOTE — TOC Progression Note (Signed)
Transition of Care [facility]) - Progression Note    Patient Details  Name: Isaac Hunter MRN: [id number] Date of Birth: 01-23-1941  Transition of Care [facility]) CM/SW Contact  Rodney Langton, RN Phone Number: 01/21/2023, 4:38 PM  Clinical Narrative:     Plan for patient to discharge to SNF for LTC and hospice support as he does not meet IPU criteria.  Bed search initiated.        Expected Discharge Plan and Services                                               Social Determinants of Health (SDOH) Interventions SDOH Screenings   Food Insecurity: No Food Insecurity (01/18/2023)  Housing: Unknown (01/18/2023)  Transportation Needs: No Transportation Needs (01/18/2023)  Utilities: Not At Risk (01/18/2023)  Financial Resource Strain: Low Risk  (12/03/2022)   Received from [facility]  Tobacco Use: High Risk (12/16/2022)   Received from [facility]    Readmission Risk Interventions    09/02/2021    3:23 PM  Readmission Risk Prevention Plan  Transportation Screening Complete  PCP or Specialist Appt within 5-7 Days Not Complete  Home Care Screening Complete  Medication Review (RN CM) Complete

## 2023-01-21 NOTE — [place] FL2 (Signed)
[place] MEDICAID FL2 LEVEL OF CARE FORM     IDENTIFICATION  Patient Name: Isaac Hunter Birthdate: January 22, 1941 Sex: male Admission Date (Current Location): 01/17/2023  [facility] and [place] Number:  Chiropodist and Address:  [facility], [address] [place], [place] [address]      Provider Number: [id number]  Attending Physician Name and Address:  Lurene Shadow, MD  Relative Name and Phone Number:  Osborne Oman)  [phone]    Current Level of Care: SNF Recommended Level of Care: Skilled Nursing Facility Prior Approval Number:    Date Approved/Denied:   PASRR Number: [id number]  Discharge Plan: SNF    Current Diagnoses: Patient Active Problem List   Diagnosis Date Noted   Hypotension 01/18/2023   Symptomatic bradycardia 01/18/2023   Heme positive stool 10/11/2022   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) 10/11/2022   Anemia 10/06/2022   GI bleed 10/06/2022   Altered mental status 03/05/2022   UTI (urinary tract infection) 03/05/2022   Microcytic anemia 03/05/2022   Iron deficiency anemia 03/05/2022   CKD (chronic kidney disease) stage 4, GFR 15-29 ml/min (HCC) 03/05/2022   Severe sepsis (HCC) 09/01/2021   Hyperkalemia 09/01/2021   Hyperglycemia 09/01/2021   Transaminasemia 09/01/2021   Acute kidney injury superimposed on chronic kidney disease (HCC) 09/01/2021   Mixed hyperlipidemia 09/01/2021   Insomnia 09/01/2021   Gross hematuria 11/17/2010   Anemia due to blood loss 11/17/2010   Thrombocytopenia (HCC) 11/17/2010   Sinus tachycardia 11/17/2010   Lactic acidosis 11/17/2010   Essential hypertension 11/17/2010   Bladder mass 11/17/2010   DM type 2 (diabetes mellitus, type 2) (HCC) 11/17/2010   Tobacco abuse 11/17/2010    Orientation RESPIRATION BLADDER Height & Weight     Self  Normal Incontinent, External catheter Weight: 61.2 kg Height:  5\' 7"  (170.2 cm)   BEHAVIORAL SYMPTOMS/MOOD NEUROLOGICAL BOWEL NUTRITION STATUS      Incontinent Diet  AMBULATORY STATUS COMMUNICATION OF NEEDS Skin   Extensive Assist Verbally Normal                       Personal Care Assistance Level of Assistance  Bathing, Total care, Feeding, Dressing Bathing Assistance: Maximum assistance Feeding assistance: Maximum assistance Dressing Assistance: Maximum assistance Total Care Assistance: Maximum assistance   Functional Limitations Info             SPECIAL CARE FACTORS FREQUENCY                       Contractures Contractures Info: Not present    Additional Factors Info  Code Status Code Status Info: DNR             Current Medications (01/21/2023):  This is the current hospital active medication list Current Facility-Administered Medications  Medication Dose Route Frequency Provider Last Rate Last Admin   acetaminophen (TYLENOL) tablet 650 mg  650 mg Oral Q6H PRN Mansy, Jan A, MD       Or   acetaminophen (TYLENOL) suppository 650 mg  650 mg Rectal Q6H PRN Mansy, Vernetta Honey, MD       antiseptic oral rinse (BIOTENE) solution 15 mL  15 mL Topical PRN Georgiann Cocker, FNP       glycopyrrolate (ROBINUL) tablet 1 mg  1 mg Oral Q4H PRN Georgiann Cocker, FNP       Or   glycopyrrolate (ROBINUL) injection 0.2 mg  0.2 mg Subcutaneous  Q4H PRN Georgiann Cocker, FNP       Or   glycopyrrolate (ROBINUL) injection 0.2 mg  0.2 mg Intravenous Q4H PRN Georgiann Cocker, FNP       HYDROmorphone (DILAUDID) injection 0.5 mg  0.5 mg Intravenous Q2H PRN Georgiann Cocker, FNP       LORazepam (ATIVAN) tablet 1 mg  1 mg Oral Q4H PRN Mansy, Jan A, MD       Or   LORazepam (ATIVAN) 2 MG/ML concentrated solution 1 mg  1 mg Sublingual Q4H PRN Mansy, Jan A, MD       Or   LORazepam (ATIVAN) injection 1 mg  1 mg Intravenous Q4H PRN Mansy, Jan A, MD       ondansetron (ZOFRAN-ODT) disintegrating tablet 4 mg  4 mg Oral Q6H PRN Mansy, Jan A, MD       Or   ondansetron [facility])  injection 4 mg  4 mg Intravenous Q6H PRN Mansy, Jan A, MD       polyvinyl alcohol (LIQUIFILM TEARS) 1.4 % ophthalmic solution 1 drop  1 drop Both Eyes QID PRN Georgiann Cocker, FNP       traZODone (DESYREL) tablet 25 mg  25 mg Oral QHS PRN Mansy, Vernetta Honey, MD         Discharge Medications: Please see discharge summary for a list of discharge medications.  Relevant Imaging Results:  Relevant Lab Results:   Additional Information SSN=[id number], plan SNF with hospice support  Rodney Langton, RN

## 2023-01-21 NOTE — Plan of Care (Addendum)
Patient is alert and oriented. Denies any pain. Problem: Education: Goal: Knowledge of the prescribed therapeutic regimen will improve Outcome: Progressing   Problem: Coping: Goal: Ability to identify and develop effective coping behavior will improve Outcome: Progressing   Problem: Clinical Measurements: Goal: Quality of life will improve Outcome: Progressing   Problem: Respiratory: Goal: Verbalizations of increased ease of respirations will increase Outcome: Progressing   Problem: Role Relationship: Goal: Family's ability to cope with current situation will improve Outcome: Progressing Goal: Ability to verbalize concerns, feelings, and thoughts to partner or family member will improve Outcome: Progressing   Problem: Pain Management: Goal: Satisfaction with pain management regimen will improve Outcome: Progressing   Problem: Education: Goal: Knowledge of General Education information will improve Description: Including pain rating scale, medication(s)/side effects and non-pharmacologic comfort measures Outcome: Progressing   Problem: Health Behavior/Discharge Planning: Goal: Ability to manage health-related needs will improve Outcome: Progressing   Problem: Clinical Measurements: Goal: Ability to maintain clinical measurements within normal limits will improve Outcome: Progressing Goal: Will remain free from infection Outcome: Progressing Goal: Diagnostic test results will improve Outcome: Progressing Goal: Respiratory complications will improve Outcome: Progressing Goal: Cardiovascular complication will be avoided Outcome: Progressing   Problem: Activity: Goal: Risk for activity intolerance will decrease Outcome: Progressing   Problem: Nutrition: Goal: Adequate nutrition will be maintained Outcome: Progressing   Problem: Coping: Goal: Level of anxiety will decrease Outcome: Progressing   Problem: Elimination: Goal: Will not experience complications  related to bowel motility Outcome: Progressing Goal: Will not experience complications related to urinary retention Outcome: Progressing   Problem: Pain Management: Goal: General experience of comfort will improve Outcome: Progressing   Problem: Safety: Goal: Ability to remain free from injury will improve Outcome: Progressing   Problem: Skin Integrity: Goal: Risk for impaired skin integrity will decrease Outcome: Progressing

## 2023-01-21 NOTE — Progress Notes (Addendum)
[facility] Liaison Note  Follow up on initial referral for [facility]) who was deemed not eligible for IPU by Hospice physician.  Patient is eligible for hospice at home or LTC, however, family is unable to care for him at home and decision was made for [facility] Transitions of Care team to start bed search for LTC.  Spoke with Rodney Langton, RN/TOC for an update.  Bed search  will be initiated today by TOC.  Hospital team will follow peripherally through final disposition collaborating with hospital medical team as needed.  Please do not hesitate to call with any hospice related questions or concerns.  Norris Cross, RN Nurse Liaison [phone]

## 2023-01-21 NOTE — Progress Notes (Signed)
                                                                                                                                                                                                           Daily Progress Note   Patient Name: Isaac Hunter       Date: 01/21/2023 DOB: Jun 17, 1940  Age: 82 y.o. MRN#: [id number] Attending Physician: Lurene Shadow, MD Primary Care Physician: Inc, [facility] Admit Date: 01/17/2023  Reason for Consultation/Follow-up: Establishing goals of care  HPI/Brief Hospital Review: 82 y.o. male  with past medical history of type 2 diabetes, tobacco abuse, HLD, CAD, and dementia admitted on 01/17/2023 with bradycardia from his renal clinic at [facility].   While in the ED, patient is experiencing multiorgan failure including renal failure with liver injury, significant intermittent bradycardia with episodes of asystole and circulatory shock.   After discussions with critical care NP Rust-Chester, patient's son Isaac Hunter and other family decided to shift care to comfort focused measures only.   PMT was consulted to assist with comfort measures.  Subjective: Extensive chart review has been completed prior to meeting patient including labs, vital signs, imaging, progress notes, orders, and available advanced directive documents from current and previous encounters.    Visited with Isaac Hunter at his bedside. He is resting in bed with eyes closed, does not acknowledge my presence in room. Appears comfortable without signs of discomfort or distress. Low symptom burden. Remains inappropriate for hospice IPU.  TOC has been engaged to initiate bed search for LTC with hospice services following.  Thank you for allowing the Palliative Medicine Team to assist in the care of this patient.  Total time:  25 minutes  Time spent includes: Detailed review of medical records (labs, imaging, vital signs), medically appropriate exam (mental status, respiratory,  cardiac, skin), discussed with treatment team, counseling and educating patient, family and staff, documenting clinical information, medication management and coordination of care.  Leeanne Deed, DNP, AGNP-C Palliative Medicine   Please contact Palliative Medicine Team phone at [phone] for questions and concerns.

## 2023-01-21 NOTE — Plan of Care (Signed)
  Problem: Education: Goal: Knowledge of the prescribed therapeutic regimen will improve Outcome: Progressing   Problem: Coping: Goal: Ability to identify and develop effective coping behavior will improve Outcome: Progressing   Problem: Clinical Measurements: Goal: Quality of life will improve Outcome: Progressing   Problem: Role Relationship: Goal: Family's ability to cope with current situation will improve Outcome: Progressing Goal: Ability to verbalize concerns, feelings, and thoughts to partner or family member will improve Outcome: Progressing   Problem: Education: Goal: Knowledge of General Education information will improve Description: Including pain rating scale, medication(s)/side effects and non-pharmacologic comfort measures Outcome: Progressing   Problem: Health Behavior/Discharge Planning: Goal: Ability to manage health-related needs will improve Outcome: Progressing

## 2023-01-21 NOTE — Progress Notes (Signed)
Progress Note    Isaac Hunter  ZOX:[id number] DOB: [birth year]  DOA: 01/17/2023 PCP: [facility]      Brief Narrative:    Medical records reviewed and are as summarized below:  Isaac Hunter is a 82 y.o. male with PMH of CAD, HTN,  type 2 diabetes mellitus, and tobacco abuse, dementia, who presented to the emergency room, from [facility] nephrology clinic, because of bradycardia and decreased responsiveness.  Reportedly, heart rate was between 20s and 40s.  He was also hypothermic (88.3 F) and hypotensive (82/32) in the ED.  He was admitted to the hospital for symptomatic bradycardia, hypotension, and AKI.  He was treated with IV fluids IV Levophed infusion.   Assessment/Plan:   Principal Problem:   Symptomatic bradycardia Active Problems:   Hypotension   Acute kidney injury superimposed on chronic kidney disease (HCC)   Body mass index is 21.14 kg/m.   Symptomatic bradycardia, hypothermia and hypotension: Patient has been transitioned to comfort care.  AKI on CKD stage V  Coronary artery disease  Dementia   PLAN  Continue comfort care. Awaiting placement to long-term care facility. Follow-up with hospice team and transition of care team to assist with disposition.    Diet Order             DIET SOFT Room service appropriate? Yes; Fluid consistency: Thin  Diet effective now                            Consultants: Palliative care and hospice  Procedures: None    Medications:    Continuous Infusions:   Anti-infectives (From admission, onward)    None              Family Communication/Anticipated D/C date and plan/Code Status   DVT prophylaxis:      Code Status: Do not attempt resuscitation (DNR) - Comfort care  Family Communication: None Disposition Plan: Plan to discharge to long-term care facility   Status is: Inpatient Remains inpatient appropriate because: Awaiting  placement       Subjective:   He has no complaints.  "I feel all right".  Objective:    Vitals:   01/20/23 1554 01/20/23 2147 01/21/23 0818 01/21/23 1439  BP: (!) 150/63 (!) 141/60 (!) 143/78 (!) 149/72  Pulse: 64 71 61 67  Resp: 18 16 17 18   Temp: 97.9 F (36.6 C) 98 F (36.7 C) 98.4 F (36.9 C)   TempSrc: Oral     SpO2: 100% 100% 100% 100%  Weight:      Height:       No data found.   Intake/Output Summary (Last 24 hours) at 01/21/2023 1532 Last data filed at 01/21/2023 8119 Gross per 24 hour  Intake --  Output 600 ml  Net -600 ml   Filed Weights   01/17/23 1648  Weight: 61.2 kg    Exam:  GEN: NAD SKIN: Warm and dry EYES: No pallor or icterus ENT: MMM CV: RRR PULM: CTA B ABD: soft, ND, NT, +BS CNS: AAO x 1 (person), non focal EXT: No edema or tenderness       Data Reviewed:   I have personally reviewed following labs and imaging studies:  Labs: Labs show the following:   Basic Metabolic Panel: Recent Labs  Lab 01/17/23 1650  NA 144  K 4.7  CL 114*  CO2 21*  GLUCOSE 224*  BUN 93*  CREATININE 6.51*  CALCIUM 8.0*   GFR Estimated Creatinine Clearance: 7.4 mL/min (A) (by C-G formula based on SCr of 6.51 mg/dL (H)). Liver Function Tests: Recent Labs  Lab 01/17/23 1650  AST 123*  ALT 118*  ALKPHOS 93  BILITOT 0.8  PROT 5.5*  ALBUMIN 2.6*   No results for input(s): "LIPASE", "AMYLASE" in the last 168 hours. No results for input(s): "AMMONIA" in the last 168 hours. Coagulation profile Recent Labs  Lab 01/17/23 1759  INR 1.2    CBC: Recent Labs  Lab 01/17/23 1650  WBC 4.6  NEUTROABS 3.1  HGB 8.5*  HCT 26.9*  MCV 80.8  PLT 52*   Cardiac Enzymes: No results for input(s): "CKTOTAL", "CKMB", "CKMBINDEX", "TROPONINI" in the last 168 hours. BNP (last 3 results) No results for input(s): "PROBNP" in the last 8760 hours. CBG: Recent Labs  Lab 01/17/23 1744  GLUCAP 208*   D-Dimer: No results for input(s): "DDIMER"  in the last 72 hours. Hgb A1c: No results for input(s): "HGBA1C" in the last 72 hours. Lipid Profile: No results for input(s): "CHOL", "HDL", "LDLCALC", "TRIG", "CHOLHDL", "LDLDIRECT" in the last 72 hours. Thyroid function studies: No results for input(s): "TSH", "T4TOTAL", "T3FREE", "THYROIDAB" in the last 72 hours.  Invalid input(s): "FREET3" Anemia work up: No results for input(s): "VITAMINB12", "FOLATE", "FERRITIN", "TIBC", "IRON", "RETICCTPCT" in the last 72 hours. Sepsis Labs: Recent Labs  Lab 01/17/23 1650 01/17/23 1942  WBC 4.6  --   LATICACIDVEN 1.8 1.3    Microbiology Recent Results (from the past 240 hours)  Blood Culture (routine x 2)     Status: None (Preliminary result)   Collection Time: 01/17/23  5:59 PM   Specimen: BLOOD  Result Value Ref Range Status   Specimen Description BLOOD BLOOD RIGHT ARM  Final   Special Requests   Final    BOTTLES DRAWN AEROBIC AND ANAEROBIC Blood Culture results may not be optimal due to an inadequate volume of blood received in culture bottles   Culture   Final    NO GROWTH 4 DAYS Performed at [facility], [address] [place], [place] [address]    Report Status PENDING  Incomplete  Blood Culture (routine x 2)     Status: None (Preliminary result)   Collection Time: 01/17/23  5:59 PM   Specimen: BLOOD  Result Value Ref Range Status   Specimen Description BLOOD BLOOD LEFT ARM  Final   Special Requests   Final    BOTTLES DRAWN AEROBIC AND ANAEROBIC Blood Culture adequate volume   Culture   Final    NO GROWTH 4 DAYS Performed at [facility], [address], [place], [place] [address]    Report Status PENDING  Incomplete    Procedures and diagnostic studies:  No results found.             LOS: 3 days   Isaac Hunter  [facility]   Pager on [email or web address]. If 7PM-7AM, please contact night-coverage at [email or web address]     01/21/2023, 3:32 PM

## 2023-01-22 DIAGNOSIS — R001 Bradycardia, unspecified: Secondary | ICD-10-CM | POA: Diagnosis not present

## 2023-01-22 DIAGNOSIS — N179 Acute kidney failure, unspecified: Secondary | ICD-10-CM | POA: Diagnosis not present

## 2023-01-22 DIAGNOSIS — Z515 Encounter for palliative care: Secondary | ICD-10-CM | POA: Diagnosis not present

## 2023-01-22 DIAGNOSIS — T68XXXA Hypothermia, initial encounter: Secondary | ICD-10-CM | POA: Diagnosis not present

## 2023-01-22 LAB — CULTURE, BLOOD (ROUTINE X 2)
Culture: NO GROWTH
Culture: NO GROWTH
Special Requests: ADEQUATE

## 2023-01-22 NOTE — Progress Notes (Signed)
Progress Note   Patient: Isaac Hunter [id number] DOB: 09-25-1940 DOA: 01/17/2023     4 DOS: the patient was seen and examined on 01/22/2023   Brief hospital course: Isaac Hunter is a 82 y.o. male with PMH of CAD, HTN,  type 2 diabetes mellitus, and tobacco abuse, dementia, who presented to the emergency room, from [facility] nephrology clinic, because of bradycardia and decreased responsiveness.  Reportedly, heart rate was between 20s and 40s.  He was also hypothermic (88.3 F) and hypotensive (82/32) in the ED.   He was admitted to the hospital for symptomatic bradycardia, hypotension, and AKI.  He was treated with IV fluids IV Levophed infusion.  Assessment and Plan Principal Problem:   Symptomatic bradycardia Active Problems:   Hypotension   Acute kidney injury superimposed on chronic kidney disease (HCC)     Body mass index is 21.14 kg/m.     Symptomatic bradycardia, hypothermia and hypotension:  Patient has been transitioned to comfort care.   AKI on CKD stage V Coronary artery disease Dementia     PLAN Continue comfort care Patient currently awaiting placement to long-term care facility. Follow-up with hospice team and transition of care team to assist with disposition.   Code Status: Do not attempt resuscitation (DNR) - Comfort care  Status is: Inpatient Remains inpatient appropriate because: Awaiting placement  Subjective:  Patient seen and examined at bedside this morning Appeared comfortable laying in bed Unable to provide enough subjective information  Physical Exam: General: NAD, lying comfortably Appear in no distress, affect appropriate Eyes: PERRLA ENT: Oral Mucosa Clear, moist  Neck: no JVD,  Cardiovascular: S1 and S2 Present, no Murmur, bradycardia Respiratory: good respiratory effort, Bilateral Air entry equal and Decreased, no Crackles, no wheezes Abdomen: Bowel Sound present, Soft and no tenderness,  Skin: no rashes Extremities: no  Pedal edema, no calf tenderness Neurologic: without any new focal findings Gait not checked due to patient safety concerns  Family Communication: None present at bedside  Disposition: Status is: Inpatient   Time spent:   Data Reviewed:  Vitals:   01/21/23 1439 01/21/23 1800 01/21/23 1957 01/22/23 0808  BP: (!) 149/72  (!) 145/70 (!) 193/113  Pulse: 67  64 100  Resp: 18  18 16   Temp:  98 F (36.7 C) (!) 97.1 F (36.2 C) (!) 97.4 F (36.3 C)  TempSrc:  Oral    SpO2: 100%  100% 100%  Weight:      Height:           Latest Ref Rng & Units 01/17/2023    4:50 PM 10/12/2022    4:29 AM 10/11/2022    4:50 AM  BMP  Glucose 70 - 99 mg/dL 272  83  80   BUN 8 - 23 mg/dL 93  37  36   Creatinine 0.61 - 1.24 mg/dL 5.36  6.44  0.34   Sodium 135 - 145 mmol/L 144  140  141   Potassium 3.5 - 5.1 mmol/L 4.7  3.5  3.4   Chloride 98 - 111 mmol/L 114  115  114   CO2 22 - 32 mmol/L 21  17  16    Calcium 8.9 - 10.3 mg/dL 8.0  7.9  8.0        Latest Ref Rng & Units 01/17/2023    4:50 PM 10/12/2022    4:29 AM 10/11/2022    4:50 AM  CBC  WBC 4.0 - 10.5 K/uL 4.6  4.8  4.7  Hemoglobin 13.0 - 17.0 g/dL 8.5  7.4  7.7   Hematocrit 39.0 - 52.0 % 26.9  23.8  24.3   Platelets 150 - 400 K/uL 52  86  82        Author: Loyce Dys, MD 01/22/2023 4:31 PM  For on call review [email or web address].

## 2023-01-22 NOTE — Progress Notes (Signed)
                                                     Palliative Care Progress Note, Assessment & Plan   Patient Name: Isaac Hunter       Date: 01/22/2023 DOB: February 07, 1941  Age: 82 y.o. MRN#: [id number] Attending Physician: Loyce Dys, MD Primary Care Physician: [facility] Admit Date: 01/17/2023  Subjective: Patient is lying in bed on his left side.  He is resting but easily awakens to my presence.  He acknowledges my presence and is able to make his wishes known.  His breakfast tray is at bedside and appears untouched.  I offered it to patient but he declined, saying he will eat it later.  No family or friends present during my visit.  HPI: 82 y.o. male  with past medical history of type 2 diabetes, tobacco abuse, HLD, CAD, and dementia admitted on 01/17/2023 with bradycardia from his renal clinic at [facility].   While in the ED, patient is experiencing multiorgan failure including renal failure with liver injury, significant intermittent bradycardia with episodes of asystole and circulatory shock.   After discussions with critical care NP Rust-Chester, patient's son [name] and other family decided to shift care to comfort focused measures only.   PMT was consulted to assist with comfort measures.  Summary of counseling/coordination of care: Extensive chart review completed prior to meeting patient including labs, vital signs, imaging, progress notes, orders, and available advanced directive documents from current and previous encounters.   After reviewing the patient's chart and assessing the patient at bedside, I discussed symptom management and plan of care with patient.  Symptoms assessed.  Patient denied pain, chest pain, constipation, or any other acute issues at this time.  No adjustment to [facility] needed at this time.  Plan remains  for patient to discharge with hospice services to follow.  TOC following closely for discharge planning.  Full comfort measures continue.  PMT will continue to monitor and support patient throughout his hospitalization.  Physical Exam Vitals reviewed.  Constitutional:      General: He is not in acute distress.    Appearance: He is normal weight.  HENT:     Head: Normocephalic.     Mouth/Throat:     Mouth: Mucous membranes are moist.  Eyes:     Pupils: Pupils are equal, round, and reactive to light.  Pulmonary:     Effort: Pulmonary effort is normal.  Abdominal:     Palpations: Abdomen is soft.  Skin:    General: Skin is warm and dry.  Neurological:     Mental Status: He is alert.  Psychiatric:        Mood and Affect: Mood normal.        Behavior: Behavior normal.        Thought Content: Thought content normal.        Judgment: Judgment normal.             Total Time 25 minutes   Time spent includes: Detailed review of medical records (labs, imaging, vital signs), medically appropriate exam (mental status, respiratory, cardiac, skin), discussed with treatment team, counseling and educating patient, family and staff, documenting clinical information, medication management and coordination of care.  Samara Deist L. Bonita Quin, DNP, FNP-BC Palliative Medicine Team

## 2023-01-22 NOTE — Care Management Important Message (Signed)
Important Message  Patient Details  Name: Isaac Hunter MRN: [id number] Date of Birth: 31-Dec-1940   Important Message Given:  Other (see comment)  Patient is on comfort care with plans to discharge with hospice. Out of respect for the patient and family no Important Message from [facility] given.   Olegario Messier A Jeanice Dempsey 01/22/2023, 11:06 AM

## 2023-01-23 DIAGNOSIS — N179 Acute kidney failure, unspecified: Secondary | ICD-10-CM | POA: Diagnosis not present

## 2023-01-23 DIAGNOSIS — Z515 Encounter for palliative care: Secondary | ICD-10-CM | POA: Diagnosis not present

## 2023-01-23 DIAGNOSIS — R001 Bradycardia, unspecified: Secondary | ICD-10-CM | POA: Diagnosis not present

## 2023-01-23 DIAGNOSIS — N189 Chronic kidney disease, unspecified: Secondary | ICD-10-CM | POA: Diagnosis not present

## 2023-01-23 LAB — BASIC METABOLIC PANEL
Anion gap: 9 (ref 5–15)
BUN: 96 mg/dL — ABNORMAL HIGH (ref 8–23)
CO2: 22 mmol/L (ref 22–32)
Calcium: 8.7 mg/dL — ABNORMAL LOW (ref 8.9–10.3)
Chloride: 115 mmol/L — ABNORMAL HIGH (ref 98–111)
Creatinine, Ser: 6.9 mg/dL — ABNORMAL HIGH (ref 0.61–1.24)
GFR, Estimated: 7 mL/min — ABNORMAL LOW (ref >60)
Glucose, Bld: 116 mg/dL — ABNORMAL HIGH (ref 70–99)
Potassium: 5.1 mmol/L (ref 3.5–5.1)
Sodium: 146 mmol/L — ABNORMAL HIGH (ref 135–145)

## 2023-01-23 LAB — CBC WITH DIFFERENTIAL/PLATELET
Abs Immature Granulocytes: 0.01 10*3/uL (ref 0.00–0.07)
Basophils Absolute: 0 10*3/uL (ref 0.0–0.1)
Basophils Relative: 1 %
Eosinophils Absolute: 0.1 10*3/uL (ref 0.0–0.5)
Eosinophils Relative: 2 %
HCT: 25.7 % — ABNORMAL LOW (ref 39.0–52.0)
Hemoglobin: 8.2 g/dL — ABNORMAL LOW (ref 13.0–17.0)
Immature Granulocytes: 0 %
Lymphocytes Relative: 23 %
Lymphs Abs: 0.8 10*3/uL (ref 0.7–4.0)
MCH: 25.2 pg — ABNORMAL LOW (ref 26.0–34.0)
MCHC: 31.9 g/dL (ref 30.0–36.0)
MCV: 79.1 fL — ABNORMAL LOW (ref 80.0–100.0)
Monocytes Absolute: 0.5 10*3/uL (ref 0.1–1.0)
Monocytes Relative: 15 %
Neutro Abs: 2 10*3/uL (ref 1.7–7.7)
Neutrophils Relative %: 59 %
Platelets: 45 10*3/uL — ABNORMAL LOW (ref 150–400)
RBC: 3.25 MIL/uL — ABNORMAL LOW (ref 4.22–5.81)
RDW: 17.7 % — ABNORMAL HIGH (ref 11.5–15.5)
WBC: 3.5 10*3/uL — ABNORMAL LOW (ref 4.0–10.5)
nRBC: 0 % (ref 0.0–0.2)

## 2023-01-23 NOTE — Plan of Care (Signed)
  Problem: Education: Goal: Knowledge of the prescribed therapeutic regimen will improve Outcome: Progressing   Problem: Coping: Goal: Ability to identify and develop effective coping behavior will improve Outcome: Progressing   Problem: Clinical Measurements: Goal: Quality of life will improve Outcome: Progressing   Problem: Respiratory: Goal: Verbalizations of increased ease of respirations will increase Outcome: Progressing   Problem: Role Relationship: Goal: Family's ability to cope with current situation will improve Outcome: Progressing Goal: Ability to verbalize concerns, feelings, and thoughts to partner or family member will improve Outcome: Progressing   Problem: Pain Management: Goal: Satisfaction with pain management regimen will improve Outcome: Progressing   Problem: Education: Goal: Knowledge of General Education information will improve Description: Including pain rating scale, medication(s)/side effects and non-pharmacologic comfort measures Outcome: Progressing   Problem: Health Behavior/Discharge Planning: Goal: Ability to manage health-related needs will improve Outcome: Progressing   Problem: Clinical Measurements: Goal: Ability to maintain clinical measurements within normal limits will improve Outcome: Progressing Goal: Will remain free from infection Outcome: Progressing Goal: Diagnostic test results will improve Outcome: Progressing Goal: Respiratory complications will improve Outcome: Progressing Goal: Cardiovascular complication will be avoided Outcome: Progressing   Problem: Activity: Goal: Risk for activity intolerance will decrease Outcome: Progressing   Problem: Nutrition: Goal: Adequate nutrition will be maintained Outcome: Progressing   Problem: Coping: Goal: Level of anxiety will decrease Outcome: Progressing   Problem: Elimination: Goal: Will not experience complications related to bowel motility Outcome:  Progressing Goal: Will not experience complications related to urinary retention Outcome: Progressing   Problem: Pain Management: Goal: General experience of comfort will improve Outcome: Progressing   Problem: Safety: Goal: Ability to remain free from injury will improve Outcome: Progressing   Problem: Skin Integrity: Goal: Risk for impaired skin integrity will decrease Outcome: Progressing

## 2023-01-23 NOTE — Progress Notes (Signed)
 Nutrition Brief Note  Chart reviewed. Pt now transitioning to comfort care.  No further nutrition interventions planned at this time.  Please re-consult as needed.   Levada Schilling, RD, LDN, CDCES Registered Dietitian III Certified Diabetes Care and Education Specialist If unable to reach this RD, please use "RD Inpatient" group chat on secure chat between hours of 8am-4 pm daily

## 2023-01-23 NOTE — Progress Notes (Signed)
Progress Note   Patient: Isaac Hunter VHQ:[id number] DOB: 06/05/1940 DOA: 01/17/2023     5 DOS: the patient was seen and examined on 01/23/2023  Brief hospital course: Isaac Hunter is a 82 y.o. male with PMH of CAD, HTN,  type 2 diabetes mellitus, and tobacco abuse, dementia, who presented to the emergency room, from [facility] nephrology clinic, because of bradycardia and decreased responsiveness.  Reportedly, heart rate was between 20s and 40s.  He was also hypothermic (88.3 F) and hypotensive (82/32) in the ED.  He was admitted to the hospital for symptomatic bradycardia, hypotension, and AKI.  He was treated with IV fluids IV Levophed infusion.   Assessment and Plan Principal Problem:   Symptomatic bradycardia Active Problems:   Hypotension   Acute kidney injury superimposed on chronic kidney disease (HCC)     Body mass index is 21.14 kg/m.     Symptomatic bradycardia, hypothermia and hypotension:  Patient has been transitioned to comfort care.   AKI on CKD stage V Coronary artery disease Dementia     PLAN Continue comfort care measures Patient continues to await placement to long-term care facility. Follow-up with hospice team and transition of care team to assist with disposition.    Code Status: Do not attempt resuscitation (DNR) - Comfort care   Status is: Inpatient Remains inpatient appropriate because: Awaiting placement   Subjective:  Patient seen and examined at bedside this morning in the presence of the son Denies nausea vomiting Appears comfortable on comfort care measures   Physical Exam: General: NAD, lying comfortably Appear in no distress, affect appropriate Eyes: PERRLA ENT: Oral Mucosa Clear, moist  Neck: no JVD,  Cardiovascular: S1 and S2 Present, no Murmur, bradycardia Respiratory: good respiratory effort, Bilateral Air entry equal and Decreased, no Crackles, no wheezes Abdomen: Bowel Sound present, Soft and no tenderness,  Skin: no  rashes Extremities: no Pedal edema, no calf tenderness Neurologic: without any new focal findings Gait not checked due to patient safety concerns   Family Communication: None present at bedside   Disposition: Status is: Inpatient     Time spent:    Data Reviewed:    Latest Ref Rng & Units 01/23/2023    5:31 AM 01/17/2023    4:50 PM 10/12/2022    4:29 AM  BMP  Glucose 70 - 99 mg/dL 413  244  83   BUN 8 - 23 mg/dL 96  93  37   Creatinine 0.61 - 1.24 mg/dL 0.10  2.72  5.36   Sodium 135 - 145 mmol/L 146  144  140   Potassium 3.5 - 5.1 mmol/L 5.1  4.7  3.5   Chloride 98 - 111 mmol/L 115  114  115   CO2 22 - 32 mmol/L 22  21  17    Calcium 8.9 - 10.3 mg/dL 8.7  8.0  7.9        Latest Ref Rng & Units 01/23/2023    5:31 AM 01/17/2023    4:50 PM 10/12/2022    4:29 AM  CBC  WBC 4.0 - 10.5 K/uL 3.5  4.6  4.8   Hemoglobin 13.0 - 17.0 g/dL 8.2  8.5  7.4   Hematocrit 39.0 - 52.0 % 25.7  26.9  23.8   Platelets 150 - 400 K/uL 45  52  86     Vitals:   01/21/23 1957 01/22/23 0808 01/22/23 2037 01/23/23 0750  BP: (!) 145/70 (!) 193/113 (!) 154/81 (!) 157/81  Pulse: 64 100  66 69  Resp: 18 16 18 17   Temp: (!) 97.1 F (36.2 C) (!) 97.4 F (36.3 C) 97.6 F (36.4 C) (!) 97.5 F (36.4 C)  TempSrc:    Axillary  SpO2: 100% 100% 100% 100%  Weight:      Height:         Author: Loyce Dys, MD 01/23/2023 4:25 PM  For on call review [email or web address].

## 2023-01-23 NOTE — Progress Notes (Signed)
[facility] Liaison Note  Follow up on new referral for hospice at [facility].  Bedsearch underway since 12.15.24.  No bed offers yet.    Hospital liaison team is following peripherally and coordinating with hospital medical team through final disposition.  Please do not hesitate to call with any hospice related questions or concerns.  Norris Cross, RN Nurse Liaison [phone]

## 2023-01-23 NOTE — Progress Notes (Signed)
[facility] Liaison Note  Follow up on current hospice referral for services at [facility] facility.  Bed search continues with no bed offers.  Hospital liaison continues for follow peripherally with collaboration with hospital medical team through final disposition.  Please call with any hospice related questions or concerns.  Norris Cross, RN Nurse Liaison [phone]

## 2023-01-23 NOTE — Progress Notes (Signed)
                                                     Palliative Care Progress Note, Assessment & Plan   Patient Name: Isaac Hunter       Date: 01/23/2023 DOB: [birth year]  Age: 82 y.o. MRN#: [id number] Attending Physician: Loyce Dys, MD Primary Care Physician: [facility] Admit Date: 01/17/2023  Subjective: Patient is lying in bed and has removed condom catheter.  He has also removed his gown and states he is "too hot right now".  No family or friends present during my visit.  HPI: 82 y.o. male  with past medical history of type 2 diabetes, tobacco abuse, HLD, CAD, and dementia admitted on 01/17/2023 with bradycardia from his renal clinic at [facility].   While in the ED, patient is experiencing multiorgan failure including renal failure with liver injury, significant intermittent bradycardia with episodes of asystole and circulatory shock.   After discussions with critical care NP Rust-Chester, patient's son [name] and other family decided to shift care to comfort focused measures only.   PMT was consulted to assist with comfort measures.  Summary of counseling/coordination of care: Extensive chart review completed prior to meeting patient including labs, vital signs, imaging, progress notes, orders, and available advanced directive documents from current and previous encounters.   After reviewing the patient's chart and assessing the patient at bedside, I spoke with patient in regards to symptom management.  He continued to endorse he was hot.  Patient readjusted in bed.  He rolled over to his right side and appeared to fall asleep.  Respirations are even and unlabored.  He denied pain or discomfort.  No adjustment to [facility] needed.  Full comfort measures continue.  TOC following closely for disposition planning.  PMT will continue  to follow and support patient throughout his hospitalization.  Physical Exam Vitals reviewed.  Constitutional:      General: He is not in acute distress.    Appearance: He is normal weight.  HENT:     Head: Normocephalic.     Mouth/Throat:     Mouth: Mucous membranes are moist.  Eyes:     Pupils: Pupils are equal, round, and reactive to light.  Pulmonary:     Effort: Pulmonary effort is normal.  Abdominal:     Palpations: Abdomen is soft.  Musculoskeletal:     Comments: MAETC, generalized weakness  Skin:    General: Skin is warm and dry.  Neurological:     Mental Status: He is alert.  Psychiatric:        Mood and Affect: Mood normal.        Behavior: Behavior normal.        Thought Content: Thought content normal.        Judgment: Judgment normal.             Total Time 25 minutes   Time spent includes: Detailed review of medical records (labs, imaging, vital signs), medically appropriate exam (mental status, respiratory, cardiac, skin), discussed with treatment team, counseling and educating patient, family and staff, documenting clinical information, medication management and coordination of care.  Samara Deist L. Bonita Quin, DNP, FNP-BC Palliative Medicine Team

## 2023-01-24 DIAGNOSIS — N179 Acute kidney failure, unspecified: Secondary | ICD-10-CM | POA: Diagnosis not present

## 2023-01-24 DIAGNOSIS — T68XXXA Hypothermia, initial encounter: Secondary | ICD-10-CM | POA: Diagnosis not present

## 2023-01-24 DIAGNOSIS — R001 Bradycardia, unspecified: Secondary | ICD-10-CM | POA: Diagnosis not present

## 2023-01-24 DIAGNOSIS — Z515 Encounter for palliative care: Secondary | ICD-10-CM | POA: Diagnosis not present

## 2023-01-24 NOTE — Progress Notes (Signed)
Palliative Care Progress Note, Assessment & Plan   Patient Name: Isaac Hunter       Date: 01/24/2023 DOB: 03/09/1940  Age: 82 y.o. MRN#: [id number] Attending Physician: Loyce Dys, MD Primary Care Physician: Inc, [facility] Services Admit Date: 01/17/2023  Subjective: Patient is lying in bed completely naked.  I attempted to place gown and blanket back on patient but he threw it on the floor.  He is lying on his left side in the fetal position.  His eyes remain closed for the entirety of my visit.  He was able to nod his head yes and no but made no vocalizations or attempts to make conversation with me.  No family or friends present during my visit.  HPI: 82 y.o. male  with past medical history of type 2 diabetes, tobacco abuse, HLD, CAD, and dementia admitted on 01/17/2023 with bradycardia from his renal clinic at [facility].   While in the ED, patient is experiencing multiorgan failure including renal failure with liver injury, significant intermittent bradycardia with episodes of asystole and circulatory shock.   After discussions with critical care NP Rust-Chester, patient's son [name] and other family decided to shift care to comfort focused measures only.   PMT was consulted to assist with comfort measures.  Summary of counseling/coordination of care: Extensive chart review completed prior to meeting patient including labs, vital signs, imaging, progress notes, orders, and available advanced directive documents from current and previous encounters.   After reviewing the patient's chart and assessing the patient at bedside, I attempted to speak with patient about symptom management and next steps.  He is unable to participate in goals of care or medical decision making at this time.  He was  minimally interactive with me but was able to confirm that he is not experiencing pain or discomfort.  He would not allow me to cover him back up or reposition him.  He is completely naked and nodded his head yes when asked if he wanted me to leave him this way.  Full comfort measures continue.  TOC following closely for discharge planning.  No adjustment to [facility] needed at this time.  PMT will continue and follow and support patient throughout his hospitalization.  Physical Exam Constitutional:      Comments: Thin, frail  HENT:     Nose: Nose normal.     Mouth/Throat:     Mouth: Mucous membranes are moist.  Pulmonary:     Effort: Pulmonary effort is normal.  Abdominal:     Palpations: Abdomen is soft.  Musculoskeletal:     Comments: Generalized weakness  Skin:    General: Skin is warm and dry.  Neurological:     Mental Status: He is alert.  Psychiatric:        Mood and Affect: Mood normal.             Total Time 25 minutes   Time spent includes: Detailed review of medical records (labs, imaging, vital signs), medically appropriate exam (mental status, respiratory, cardiac, skin), discussed with treatment team, counseling and educating patient, family and staff, documenting clinical information, medication management and coordination of care.  Samara Deist L. Bonita Quin, DNP, FNP-BC Palliative Medicine  Team

## 2023-01-24 NOTE — Progress Notes (Signed)
Progress Note   Patient: Isaac Hunter WJX:[id number] DOB: 04-04-1940 DOA: 01/17/2023     6 DOS: the patient was seen and examined on 01/24/2023    Brief hospital course: Isaac Hunter is a 82 y.o. male with PMH of CAD, HTN,  type 2 diabetes mellitus, and tobacco abuse, dementia, who presented to the emergency room, from [facility] nephrology clinic, because of bradycardia and decreased responsiveness.  Reportedly, heart rate was between 20s and 40s.  He was also hypothermic (88.3 F) and hypotensive (82/32) in the ED.  He was admitted to the hospital for symptomatic bradycardia, hypotension, and AKI.  He was treated with IV fluids IV Levophed infusion.   Assessment and Plan Principal Problem:   Symptomatic bradycardia Active Problems:   Hypotension   Acute kidney injury superimposed on chronic kidney disease (HCC)     Body mass index is 21.14 kg/m.     Symptomatic bradycardia, hypothermia and hypotension:  Patient has been transitioned to comfort care.   AKI on CKD stage V Coronary artery disease Dementia     PLAN Continue comfort care measures Patient continues to await placement to long-term care facility. Follow-up with hospice team and transition of care team to assist with disposition.    Code Status: Do not attempt resuscitation (DNR) - Comfort care   Status is: Inpatient Remains inpatient appropriate because: Awaiting placement   Subjective:  Patient continues to remain comfortable on comfort care measures Denies any acute overnight events   Physical Exam: General: NAD, lying comfortably Appear in no distress, affect appropriate Eyes: PERRLA ENT: Oral Mucosa Clear, moist  Neck: no JVD,  Cardiovascular: S1 and S2 Present, no Murmur, bradycardia Respiratory: good respiratory effort, Bilateral Air entry equal and Decreased, no Crackles, no wheezes Abdomen: Bowel Sound present, Soft and no tenderness,  Skin: no rashes Extremities: no Pedal edema, no calf  tenderness Neurologic: without any new focal findings Gait not checked due to patient safety concerns   Family Communication: None present at bedside   Disposition: Status is: Inpatient     Time spent:    Data Reviewed:    Latest Ref Rng & Units 01/23/2023    5:31 AM 01/17/2023    4:50 PM 10/12/2022    4:29 AM  CBC  WBC 4.0 - 10.5 K/uL 3.5  4.6  4.8   Hemoglobin 13.0 - 17.0 g/dL 8.2  8.5  7.4   Hematocrit 39.0 - 52.0 % 25.7  26.9  23.8   Platelets 150 - 400 K/uL 45  52  86        Latest Ref Rng & Units 01/23/2023    5:31 AM 01/17/2023    4:50 PM 10/12/2022    4:29 AM  BMP  Glucose 70 - 99 mg/dL 213  086  83   BUN 8 - 23 mg/dL 96  93  37   Creatinine 0.61 - 1.24 mg/dL 5.78  4.69  6.29   Sodium 135 - 145 mmol/L 146  144  140   Potassium 3.5 - 5.1 mmol/L 5.1  4.7  3.5   Chloride 98 - 111 mmol/L 115  114  115   CO2 22 - 32 mmol/L 22  21  17    Calcium 8.9 - 10.3 mg/dL 8.7  8.0  7.9     Vitals:   01/22/23 2037 01/23/23 0750 01/23/23 1949 01/24/23 0729  BP: (!) 154/81 (!) 157/81 131/64 (!) 154/80  Pulse: 66 69 72 (!) 104  Resp: 18 17 18  17  Temp: 97.6 F (36.4 C) (!) 97.5 F (36.4 C) (!) 97 F (36.1 C) (!) 97.5 F (36.4 C)  TempSrc:  Axillary    SpO2: 100% 100% 100% 100%  Weight:      Height:         Author: Loyce Dys, MD 01/24/2023 3:55 PM  For on call review [email or web address].

## 2023-01-24 NOTE — Progress Notes (Signed)
[facility] Liaison Note   Follow up on current hospice referral for services at LTC facility.   Bed search continues with no bed offers.   Hospital liaison continues for follow peripherally with collaboration with hospital medical team through final disposition.   Please call with any hospice related questions or concerns.  [facility] Liaison [phone]

## 2023-01-25 DIAGNOSIS — Z515 Encounter for palliative care: Secondary | ICD-10-CM | POA: Diagnosis not present

## 2023-01-25 DIAGNOSIS — R001 Bradycardia, unspecified: Secondary | ICD-10-CM | POA: Diagnosis not present

## 2023-01-25 DIAGNOSIS — N179 Acute kidney failure, unspecified: Secondary | ICD-10-CM | POA: Diagnosis not present

## 2023-01-25 DIAGNOSIS — T68XXXA Hypothermia, initial encounter: Secondary | ICD-10-CM | POA: Diagnosis not present

## 2023-01-25 NOTE — Progress Notes (Signed)
Progress Note   Patient: Isaac Hunter [id number]:[id number] DOB: May 22, 1940 DOA: 01/17/2023     7 DOS: the patient was seen and examined on 01/25/2023    Brief hospital course: Isaac Hunter is a 82 y.o. male with PMH of CAD, HTN,  type 2 diabetes mellitus, and tobacco abuse, dementia, who presented to the emergency room, from [facility] nephrology clinic, because of bradycardia and decreased responsiveness.  Reportedly, heart rate was between 20s and 40s.  He was also hypothermic (88.3 F) and hypotensive (82/32) in the ED.  He was admitted to the hospital for symptomatic bradycardia, hypotension, and AKI.  He was treated with IV fluids IV Levophed infusion. After discussion with patient's family they have made decision for comfort care measures only   Assessment and Plan Principal Problem:   Symptomatic bradycardia Active Problems:   Hypotension   Acute kidney injury superimposed on chronic kidney disease (HCC)      Symptomatic bradycardia, hypothermia and hypotension:  Patient has been transitioned to comfort care.   AKI on CKD stage V Coronary artery disease Dementia     PLAN Continue comfort care measures Plan of care discussed with hospice and palliative team Patient continues to await placement to long-term care facility. Follow-up with hospice team and transition of care team to assist with disposition.    Code Status: Do not attempt resuscitation (DNR) - Comfort care   Status is: Inpatient Remains inpatient appropriate because: Awaiting placement   Subjective:  Patient is laying in bed appears comfortable on comfort care measures Denies any acute overnight events   Physical Exam: General: NAD, lying comfortably Appear in no distress, affect appropriate Eyes: PERRLA ENT: Oral Mucosa Clear, moist  Neck: no JVD,  Cardiovascular: S1 and S2 Present, no Murmur, bradycardia Respiratory: good respiratory effort, Bilateral Air entry equal and Decreased, no Crackles, no  wheezes Abdomen: Bowel Sound present, Soft and no tenderness,  Skin: no rashes Extremities: no Pedal edema, no calf tenderness Neurologic: without any new focal findings Gait not checked due to patient safety concerns   Family Communication: None present at bedside   Disposition: Status is: Inpatient     Time spent:  35 minutes   Data Reviewed: Marland Kitchen    Latest Ref Rng & Units 01/23/2023    5:31 AM 01/17/2023    4:50 PM 10/12/2022    4:29 AM  BMP  Glucose 70 - 99 mg/dL 244  010  83   BUN 8 - 23 mg/dL 96  93  37   Creatinine 0.61 - 1.24 mg/dL 2.72  5.36  6.44   Sodium 135 - 145 mmol/L 146  144  140   Potassium 3.5 - 5.1 mmol/L 5.1  4.7  3.5   Chloride 98 - 111 mmol/L 115  114  115   CO2 22 - 32 mmol/L 22  21  17    Calcium 8.9 - 10.3 mg/dL 8.7  8.0  7.9       Vitals:   01/24/23 0729 01/24/23 2119 01/25/23 0546 01/25/23 0755  BP: (!) 154/80 (!) 143/62 (!) 180/89 (!) 144/92  Pulse: (!) 104 64 96 (!) 58  Resp: 17 12 18 16   Temp: (!) 97.5 F (36.4 C) 97.6 F (36.4 C) 97.6 F (36.4 C) (!) 97 F (36.1 C)  TempSrc:      SpO2: 100% 100% 100% 100%  Weight:      Height:         Author: Loyce Dys, MD 01/25/2023 3:47 PM  For on call review [email or web address].

## 2023-01-25 NOTE — Progress Notes (Signed)
                                                     Palliative Care Progress Note, Assessment & Plan   Patient Name: Isaac Hunter       Date: 01/25/2023 DOB: Aug 13, 1940  Age: 82 y.o. MRN#: [id number] Attending Physician: Loyce Dys, MD Primary Care Physician: Inc, [facility] Admit Date: 01/17/2023  Subjective: Patient is lying in bed on his right side.  He is asleep in no apparent distress.  His respirations are even and unlabored.  He awakens to my presence but does not open his eyes.  No family or friends present during my visit.  HPI: 82 y.o. male  with past medical history of type 2 diabetes, tobacco abuse, HLD, CAD, and dementia admitted on 01/17/2023 with bradycardia from his renal clinic at [facility].   While in the ED, patient is experiencing multiorgan failure including renal failure with liver injury, significant intermittent bradycardia with episodes of asystole and circulatory shock.   After discussions with critical care NP Rust-Chester, patient's son Isaac Hunter and other family decided to shift care to comfort focused measures only.   PMT was consulted to assist with comfort measures.  Summary of counseling/coordination of care: Extensive chart review completed prior to meeting patient including labs, vital signs, imaging, progress notes, orders, and available advanced directive documents from current and previous encounters.   After reviewing the patient's chart and assessing the patient at bedside, I spoke with patient in regards to symptom management.  When asked how he is feeling he responded with, "Hello doll I don't mean to ignore you."  This is the only vocalization patient made during my visit.  Multiple attempts made to assess his pain and discomfort.  He remained asleep.  Nonverbal signs of pain such as brow furrowing  and grimacing not noted.  No adjustment to [facility] needed at this time.  After visiting with the patient, I counseled with attending Dr. Meriam Sprague in person.  We both shared concerns that patient is not IPU appropriate and does not have any LTC options at this point.  As per attending, patient's family has made it clear that they are not able to take care of patient at home.  TOC is following for discharge planning.  Full comfort measures to continue.  PMT will continue to follow and support patient and family throughout his hospitalization.  Physical Exam Vitals reviewed.  Constitutional:      General: He is not in acute distress.    Appearance: He is normal weight.  HENT:     Head: Normocephalic.  Abdominal:     Palpations: Abdomen is soft.  Musculoskeletal:     Comments: Generalized weakness  Skin:    General: Skin is warm and dry.  Psychiatric:        Behavior: Behavior normal.             Total Time 35 minutes   Time spent includes: Detailed review of medical records (labs, imaging, vital signs), medically appropriate exam (mental status, respiratory, cardiac, skin), discussed with treatment team, counseling and educating patient, family and staff, documenting clinical information, medication management and coordination of care.  Samara Deist L. Bonita Quin, DNP, FNP-BC Palliative Medicine Team

## 2023-01-25 NOTE — TOC Progression Note (Signed)
Transition of Care [facility]) - Progression Note    Patient Details  Name: Isaac Hunter MRN: [id number] Date of Birth: December 01, 1940  Transition of Care [facility]) CM/SW Contact  Garret Reddish, RN Phone Number: 01/25/2023, 11:15 AM  Clinical Narrative:    Chart reviewed.  Currently no bed offers for a Long-term Care bed at this time.  I have extended bed search to entire hub to see if any facility will has availability for Long-term care resident.  I have spoken with Kenney Houseman with [facility] and Gavin Pound with [facility] to look at patient for Long-term care.  TOC will continue to follow progress of patient.          Expected Discharge Plan and Services                                               Social Determinants of Health (SDOH) Interventions SDOH Screenings   Food Insecurity: No Food Insecurity (01/18/2023)  Housing: Unknown (01/18/2023)  Transportation Needs: No Transportation Needs (01/18/2023)  Utilities: Not At Risk (01/18/2023)  Financial Resource Strain: Low Risk  (12/03/2022)   Received from [facility]  Tobacco Use: High Risk (12/16/2022)   Received from [facility]    Readmission Risk Interventions    09/02/2021    3:23 PM  Readmission Risk Prevention Plan  Transportation Screening Complete  PCP or Specialist Appt within 5-7 Days Not Complete  Home Care Screening Complete  Medication Review (RN CM) Complete

## 2023-01-26 DIAGNOSIS — N179 Acute kidney failure, unspecified: Secondary | ICD-10-CM | POA: Diagnosis not present

## 2023-01-26 DIAGNOSIS — I959 Hypotension, unspecified: Secondary | ICD-10-CM | POA: Diagnosis not present

## 2023-01-26 DIAGNOSIS — Z515 Encounter for palliative care: Secondary | ICD-10-CM | POA: Diagnosis not present

## 2023-01-26 DIAGNOSIS — R001 Bradycardia, unspecified: Secondary | ICD-10-CM | POA: Diagnosis not present

## 2023-01-26 NOTE — Progress Notes (Signed)
 [facility] Liaison Note   Follow up on current hospice referral for services at LTC facility.   Bed search continues with no bed offers.   Hospital liaison continues for follow peripherally with collaboration with hospital medical team through final disposition.   Please call with any hospice related questions or concerns.  [facility] Liaison [phone]

## 2023-01-26 NOTE — Care Management Important Message (Signed)
Important Message  Patient Details  Name: Isaac Hunter MRN: [id number] Date of Birth: [birth year]   Important Message Given:  Yes - Medicare IM     Bernadette Hoit 01/26/2023, 9:59 AM

## 2023-01-26 NOTE — Progress Notes (Signed)
  Progress Note   Patient: Isaac Hunter ZOX:[id number] DOB: 08-22-1940 DOA: 01/17/2023     8 DOS: the patient was seen and examined on 01/26/2023    Brief hospital course: ARJUNREDDY BALTHASER is a 82 y.o. male with PMH of CAD, HTN,  type 2 diabetes mellitus, and tobacco abuse, dementia, who presented to the emergency room, from [facility] nephrology clinic, because of bradycardia and decreased responsiveness.  Reportedly, heart rate was between 20s and 40s.  He was also hypothermic (88.3 F) and hypotensive (82/32) in the ED.  He was admitted to the hospital for symptomatic bradycardia, hypotension, and AKI.  He was treated with IV fluids IV Levophed infusion. After discussion with patient's family they have made decision for comfort care measures only  12/20: Waiting for long-term care placement with hospice   Assessment and Plan Principal Problem:   Symptomatic bradycardia Active Problems:   Hypotension   Acute kidney injury superimposed on chronic kidney disease (HCC)   Symptomatic bradycardia, hypothermia and hypotension:  AKI on CKD stage V Coronary artery disease Dementia     PLAN Continue comfort care measures Plan of care discussed with hospice and palliative team Patient continues to await placement to long-term care facility. Follow-up with hospice team and transition of care team to assist with disposition.    Code Status: Do not attempt resuscitation (DNR) - Comfort care   Status is: Inpatient Remains inpatient appropriate because: Awaiting long-term care placement   Subjective:  Patient is laying in bed appears comfortable on comfort care measures no acute events overnight   Physical Exam: General: NAD, lying comfortably Appear in no distress, affect appropriate Eyes: PERRLA ENT: Oral Mucosa Clear, moist  Neck: no JVD,  Cardiovascular: S1 and S2 Present, no Murmur, bradycardia Respiratory: good respiratory effort, Bilateral Air entry equal and Decreased, no  Crackles, no wheezes Abdomen: Bowel Sound present, Soft and no tenderness,  Skin: no rashes Extremities: no Pedal edema, no calf tenderness Neurologic: without any new focal findings    Family Communication: None present at bedside   Disposition: Status is: Inpatient     Time spent:  15 minutes   Data Reviewed: Marland Kitchen    Latest Ref Rng & Units 01/23/2023    5:31 AM 01/17/2023    4:50 PM 10/12/2022    4:29 AM  BMP  Glucose 70 - 99 mg/dL 811  914  83   BUN 8 - 23 mg/dL 96  93  37   Creatinine 0.61 - 1.24 mg/dL 7.82  9.56  2.13   Sodium 135 - 145 mmol/L 146  144  140   Potassium 3.5 - 5.1 mmol/L 5.1  4.7  3.5   Chloride 98 - 111 mmol/L 115  114  115   CO2 22 - 32 mmol/L 22  21  17    Calcium 8.9 - 10.3 mg/dL 8.7  8.0  7.9       Vitals:   01/25/23 0755 01/25/23 1554 01/26/23 0412 01/26/23 0838  BP: (!) 144/92 (!) 143/78 (!) 158/96 (!) 153/64  Pulse: (!) 58 79 74 66  Resp: 16 16 20 14   Temp: (!) 97 F (36.1 C) (!) 97.4 F (36.3 C) 97.9 F (36.6 C) (!) 97.5 F (36.4 C)  TempSrc:      SpO2: 100% 100% 98% 92%  Weight:      Height:         Author: Delfino Lovett, MD 01/26/2023 12:36 PM  For on call review [email or web address].

## 2023-01-26 NOTE — Care Management Important Message (Signed)
Important Message  Patient Details  Name: Isaac Hunter MRN: [id number] Date of Birth: 09/10/1940   Important Message Given:  Yes - Medicare IM Left IM with Nurse to be given as pt was under contact precautions.    Bernadette Hoit 01/26/2023, 10:05 AM

## 2023-01-26 NOTE — Progress Notes (Signed)
                                                                                                                                                                                                           Daily Progress Note   Patient Name: Isaac Hunter       Date: 01/26/2023 DOB: August 13, 1940  Age: 82 y.o. MRN#: [id number] Attending Physician: Delfino Lovett, MD Primary Care Physician: Inc, [facility] Admit Date: 01/17/2023  Reason for Consultation/Follow-up: Establishing goals of care  HPI/Brief Hospital Review: 82 y.o. male  with past medical history of type 2 diabetes, tobacco abuse, HLD, CAD, and dementia admitted on 01/17/2023 with bradycardia from his renal clinic at [facility].   While in the ED, patient is experiencing multiorgan failure including renal failure with liver injury, significant intermittent bradycardia with episodes of asystole and circulatory shock.   After discussions with critical care NP Rust-Chester, patient's son [name] and other family decided to shift care to comfort focused measures only.   PMT was consulted to assist with comfort measures.  Subjective: Extensive chart review has been completed prior to meeting patient including labs, vital signs, imaging, progress notes, orders, and available advanced directive documents from current and previous encounters.    Visited with Mr. Hoehn at his bedside. He is resting in bed with eyes closed, assisted with repositioning and bed coverings, responded with unintelligible communication. Appears comfortable without any signs of discomfort or distress. Low symptom burden at this time. TOC remains actively engaged in discharge disposition--LTC with hospice services following.  Thank you for allowing the Palliative Medicine Team to assist in the care of this patient.  Total time:  25 minutes  Time spent includes: Detailed review of medical records (labs, imaging, vital signs), medically appropriate exam  (mental status, respiratory, cardiac, skin), discussed with treatment team, counseling and educating patient, family and staff, documenting clinical information, medication management and coordination of care.  Leeanne Deed, DNP, AGNP-C Palliative Medicine   Please contact Palliative Medicine Team phone at [phone] for questions and concerns.

## 2023-01-27 DIAGNOSIS — N189 Chronic kidney disease, unspecified: Secondary | ICD-10-CM | POA: Diagnosis not present

## 2023-01-27 DIAGNOSIS — D61818 Other pancytopenia: Secondary | ICD-10-CM | POA: Diagnosis not present

## 2023-01-27 DIAGNOSIS — N179 Acute kidney failure, unspecified: Secondary | ICD-10-CM | POA: Diagnosis not present

## 2023-01-27 DIAGNOSIS — E870 Hyperosmolality and hypernatremia: Secondary | ICD-10-CM | POA: Insufficient documentation

## 2023-01-27 DIAGNOSIS — R7989 Other specified abnormal findings of blood chemistry: Secondary | ICD-10-CM | POA: Insufficient documentation

## 2023-01-27 DIAGNOSIS — R001 Bradycardia, unspecified: Secondary | ICD-10-CM | POA: Diagnosis not present

## 2023-01-27 DIAGNOSIS — F03918 Unspecified dementia, unspecified severity, with other behavioral disturbance: Secondary | ICD-10-CM | POA: Insufficient documentation

## 2023-01-27 DIAGNOSIS — T68XXXA Hypothermia, initial encounter: Secondary | ICD-10-CM | POA: Insufficient documentation

## 2023-01-27 NOTE — TOC Progression Note (Signed)
Transition of Care [facility]) - Progression Note    Patient Details  Name: Isaac Hunter MRN: [id number] Date of Birth: 1940/02/23  [facility]) CM/SW Contact  Rodney Langton, RN Phone Number: 01/27/2023, 3:02 PM  Clinical Narrative:     Coralyn Pear out to [facility] to inquire about status of bed offer being for LTC with hospice support, they aren't able to clarify until Monday.  TOC will continue to follow.        Expected Discharge Plan and Services                                               Social Determinants of Health (SDOH) Interventions SDOH Screenings   Food Insecurity: No Food Insecurity (01/18/2023)  Housing: Unknown (01/18/2023)  Transportation Needs: No Transportation Needs (01/18/2023)  Utilities: Not At Risk (01/18/2023)  Financial Resource Strain: Low Risk  (12/03/2022)   Received from [facility]  Tobacco Use: High Risk (12/16/2022)   Received from [facility]    Readmission Risk Interventions    09/02/2021    3:23 PM  Readmission Risk Prevention Plan  Transportation Screening Complete  PCP or Specialist Appt within 5-7 Days Not Complete  Home Care Screening Complete  Medication Review (RN CM) Complete

## 2023-01-27 NOTE — Plan of Care (Signed)
  Problem: Coping: Goal: Ability to identify and develop effective coping behavior will improve Outcome: Progressing   Problem: Respiratory: Goal: Verbalizations of increased ease of respirations will increase Outcome: Progressing

## 2023-01-27 NOTE — Progress Notes (Signed)
  Progress Note   Patient: Isaac Hunter [id number] DOB: 1940-05-23 DOA: 01/17/2023     9 DOS: the patient was seen and examined on 01/27/2023   Brief hospital course: Isaac Hunter is a 82 y.o. male with PMH of CAD, HTN,  type 2 diabetes mellitus, and tobacco abuse, dementia, who presented to the emergency room, from [facility] nephrology clinic, because of bradycardia and decreased responsiveness.  Reportedly, heart rate was between 20s and 40s.  He was also hypothermic (88.3 F) and hypotensive (82/32) in the ED.  He was admitted to the hospital for symptomatic bradycardia, hypotension, and AKI.  He was treated with IV fluids IV Levophed infusion. 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      Subjective:  Patient is totally confused.  Physical Exam: Vitals:   01/26/23 0412 01/26/23 0838 01/26/23 1936 01/27/23 0452  BP: (!) 158/96 (!) 153/64 (!) 141/81 125/83  Pulse: 74 66 81 88  Resp: 20 14 18 18   Temp: 97.9 F (36.6 C) (!) 97.5 F (36.4 C) (!) 97.5 F (36.4 C) 97.8 F (36.6 C)  TempSrc:      SpO2: 98% 92% 100% 98%  Weight:      Height:       General exam: Appears comfortable. Respiratory system: Clear to auscultation. Respiratory effort normal. Cardiovascular system: S1 & S2 heard, RRR. No JVD, murmurs, rubs, gallops or clicks. No pedal edema. Gastrointestinal system: Abdomen is nondistended, soft and nontender. No organomegaly or masses felt. Normal bowel sounds  heard. Central nervous system: Drowsy and confused. Extremities: Symmetric  Skin: No rashes, lesions or ulcers   Data Reviewed:  There are no new results to review at this time.  Family Communication: None  Disposition: Status is: Inpatient Remains inpatient appropriate because: Comfort care.     Time spent: 35 minutes  Author: Marrion Coy, MD 01/27/2023 11:51 AM  For on call review [email or web address].

## 2023-01-27 NOTE — Progress Notes (Signed)
Patient has been more calm less restless since given ativan po. Pt has been drowsy but allows for care. Ate 100% breakfast. Refused lunch and dinner.

## 2023-01-27 NOTE — Hospital Course (Addendum)
Isaac Hunter is a 82 y.o. male with PMH of CAD, HTN,  type 2 diabetes mellitus, and tobacco abuse, dementia, who presented to the emergency room, from [facility] nephrology clinic, because of bradycardia and decreased responsiveness.  Reportedly, heart rate was between 20s and 40s.  He was also hypothermic (88.3 F) and hypotensive (82/32) in the ED.  He was admitted to the hospital for symptomatic bradycardia, hypotension, and AKI.  He was treated with IV fluids IV Levophed infusion. After discussion with patient's family they have made decision for comfort care measures only.  12/23: Patient appears comfortable, apparently had a bed at [facility] healthcare where he can go with hospice help-pending insurance authorization.  12/24: Remained hemodynamically stable and comfortable, awaiting LTC placement with hospice.  12/26: Remains stable, still awaiting placement  12/27: Remained hemodynamically stable, having difficulty with family to decide about LTC.  12/28:Remained stable, family decided on [facility]- pending insurance authorization.

## 2023-01-28 DIAGNOSIS — N179 Acute kidney failure, unspecified: Secondary | ICD-10-CM | POA: Diagnosis not present

## 2023-01-28 DIAGNOSIS — F03918 Unspecified dementia, unspecified severity, with other behavioral disturbance: Secondary | ICD-10-CM | POA: Diagnosis not present

## 2023-01-28 DIAGNOSIS — R001 Bradycardia, unspecified: Secondary | ICD-10-CM | POA: Diagnosis not present

## 2023-01-28 DIAGNOSIS — N189 Chronic kidney disease, unspecified: Secondary | ICD-10-CM | POA: Diagnosis not present

## 2023-01-28 NOTE — Progress Notes (Signed)
  Progress Note   Patient: Isaac Hunter ZOX:[id number] DOB: [birth year] DOA: 01/17/2023     10 DOS: the patient was seen and examined on 01/28/2023   Brief hospital course: Isaac Hunter is a 82 y.o. male with PMH of CAD, HTN,  type 2 diabetes mellitus, and tobacco abuse, dementia, who presented to the emergency room, from [facility] nephrology clinic, because of bradycardia and decreased responsiveness.  Reportedly, heart rate was between 20s and 40s.  He was also hypothermic (88.3 F) and hypotensive (82/32) in the ED.  He was admitted to the hospital for symptomatic bradycardia, hypotension, and AKI.  He was treated with IV fluids IV Levophed infusion. After discussion with patient's family they have made decision for comfort care measures only   Principal Problem:   Symptomatic bradycardia Active Problems:   Hypotension   Metabolic acidosis   Essential hypertension   Acute kidney injury superimposed on chronic kidney disease (HCC)   Pancytopenia (HCC)   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)   Hypothermia   Elevated troponin   Hypernatremia   Dementia with behavioral disturbance (HCC)   Assessment and Plan: Patient is comfortable, continue current care.  Pending transfer to long-term care with comfort.      Subjective:  Patient is confused, but follows commands.  Physical Exam: Vitals:   01/27/23 2017 01/27/23 2235 01/28/23 0433 01/28/23 0813  BP: (!) 158/77 (!) 140/94 (!) 147/106 (!) 144/86  Pulse: 86 96 80 69  Resp: 18 18 17 15   Temp: (!) 97.4 F (36.3 C) 98.4 F (36.9 C) (!) 97.4 F (36.3 C) 97.6 F (36.4 C)  TempSrc:      SpO2: 100% (!) 87% 98% 100%  Weight:      Height:       General exam: Appears calm and comfortable,   Respiratory system: Clear to auscultation. Respiratory effort normal. Cardiovascular system: S1 & S2 heard, RRR. No JVD, murmurs, rubs, gallops or clicks. No pedal edema. Gastrointestinal system:  Abdomen is nondistended, soft and nontender. No organomegaly or masses felt. Normal bowel sounds heard. Central nervous system: Alert and confused. Extremities: Symmetric Skin: No rashes, lesions or ulcers    Data Reviewed:  There are no new results to review at this time.  Family Communication: None  Disposition: Status is: Inpatient Remains inpatient appropriate because: Comfort care.     Time spent: 25 minutes  Author: Marrion Coy, MD 01/28/2023 10:17 AM  For on call review [email or web address].

## 2023-01-28 NOTE — Plan of Care (Signed)
  Problem: Coping: Goal: Ability to identify and develop effective coping behavior will improve Outcome: Not Progressing   

## 2023-01-29 DIAGNOSIS — I959 Hypotension, unspecified: Secondary | ICD-10-CM | POA: Diagnosis not present

## 2023-01-29 DIAGNOSIS — N179 Acute kidney failure, unspecified: Secondary | ICD-10-CM | POA: Diagnosis not present

## 2023-01-29 DIAGNOSIS — D61818 Other pancytopenia: Secondary | ICD-10-CM | POA: Diagnosis not present

## 2023-01-29 DIAGNOSIS — R001 Bradycardia, unspecified: Secondary | ICD-10-CM | POA: Diagnosis not present

## 2023-01-29 NOTE — Care Management Important Message (Signed)
Important Message  Patient Details  Name: Isaac Hunter MRN: [id number] Date of Birth: 07-28-1940   Important Message Given:  Yes - Medicare IM Left IM with PTs Nurse as Pt was on Contact Precautions.    Bernadette Hoit 01/29/2023, 11:05 AM

## 2023-01-29 NOTE — Progress Notes (Signed)
  Progress Note   Patient: Isaac Hunter UYQ:[id number] DOB: 20-Nov-1940 DOA: 01/17/2023     11 DOS: the patient was seen and examined on 01/29/2023   Brief hospital course: BRANDIE BILOTTA is a 82 y.o. male with PMH of CAD, HTN,  type 2 diabetes mellitus, and tobacco abuse, dementia, who presented to the emergency room, from [facility] nephrology clinic, because of bradycardia and decreased responsiveness.  Reportedly, heart rate was between 20s and 40s.  He was also hypothermic (88.3 F) and hypotensive (82/32) in the ED.  He was admitted to the hospital for symptomatic bradycardia, hypotension, and AKI.  He was treated with IV fluids IV Levophed infusion. After discussion with patient's family they have made decision for comfort care measures only.  12/23: Patient appears comfortable, apparently had a bed at [facility] where he can go with hospice help-pending insurance authorization.   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  Pending transfer to long-term care with comfort.  Subjective:  Patient was resting comfortably, follows some simple commands.  Physical Exam: Vitals:   01/28/23 0433 01/28/23 0813 01/28/23 2057 01/29/23 0802  BP: (!) 147/106 (!) 144/86 (!) 185/98 (!) 129/118  Pulse: 80 69 83 74  Resp: 17 15  16   Temp: (!) 97.4 F (36.3 C) 97.6 F (36.4 C) 97.7 F (36.5 C) 97.6 F (36.4 C)  TempSrc:      SpO2: 98% 100% 100% 100%  Weight:      Height:       General.  Frail and malnourished elderly man, in no acute distress. Pulmonary.  Lungs clear bilaterally,  normal respiratory effort. CV.  Regular rate and rhythm, no JVD, rub or murmur. Abdomen.  Soft, nontender, nondistended, BS positive. CNS.  Little somnolent, following some simple commands. Extremities.  No edema, no cyanosis, pulses intact and symmetrical.    Data Reviewed:  There are no new results to review at this time.  Family Communication:   Disposition: Status is: Inpatient Remains inpatient appropriate because: Comfort care.     Time spent: 40 minutes  This record has been created using Conservation officer, historic buildings. Errors have been sought and corrected,but may not always be located. Such creation errors do not reflect on the standard of care.   Author: Arnetha Courser, MD 01/29/2023 3:41 PM  For on call review [email or web address].

## 2023-01-29 NOTE — TOC Progression Note (Signed)
Transition of Care [facility]) - Progression Note    Patient Details  Name: Isaac Hunter MRN: [id number] Date of Birth: [birth year]  Transition of Care [facility]) CM/SW Contact  Allena Katz, LCSW Phone Number: 01/29/2023, 1:59 PM  Clinical Narrative:   CSW shared with son that [facility] can take pt for LTC as long as he has medicaid. Medicaid specialist messaged. Tanya notified. Son reports he will let me know the familys decision today.         Expected Discharge Plan and Services                                               Social Determinants of Health (SDOH) Interventions SDOH Screenings   Food Insecurity: No Food Insecurity (01/18/2023)  Housing: Unknown (01/18/2023)  Transportation Needs: No Transportation Needs (01/18/2023)  Utilities: Not At Risk (01/18/2023)  Financial Resource Strain: Low Risk  (12/03/2022)   Received from [facility]  Tobacco Use: High Risk (12/16/2022)   Received from [facility]    Readmission Risk Interventions    09/02/2021    3:23 PM  Readmission Risk Prevention Plan  Transportation Screening Complete  PCP or Specialist Appt within 5-7 Days Not Complete  Home Care Screening Complete  Medication Review (RN CM) Complete

## 2023-01-29 NOTE — Progress Notes (Addendum)
[facility] Liaison Note   Follow up on current hospice referral for services at LTC facility.     Hospital liaison continues for follow peripherally with collaboration with hospital medical team through final disposition.   Please call with any hospice related questions or concerns.   [facility] Liaison [phone]

## 2023-01-30 DIAGNOSIS — I959 Hypotension, unspecified: Secondary | ICD-10-CM | POA: Diagnosis not present

## 2023-01-30 DIAGNOSIS — R001 Bradycardia, unspecified: Secondary | ICD-10-CM | POA: Diagnosis not present

## 2023-01-30 DIAGNOSIS — D61818 Other pancytopenia: Secondary | ICD-10-CM | POA: Diagnosis not present

## 2023-01-30 DIAGNOSIS — N179 Acute kidney failure, unspecified: Secondary | ICD-10-CM | POA: Diagnosis not present

## 2023-01-30 MED ORDER — AMLODIPINE BESYLATE 10 MG PO TABS
10.0000 mg | ORAL_TABLET | Freq: Every day | ORAL | Status: DC
  Administered 2023-01-31 – 2023-02-06 (×6): 10 mg via ORAL
  Filled 2023-01-30 (×5): qty 1

## 2023-01-30 NOTE — Plan of Care (Signed)
  Problem: Clinical Measurements: Goal: Quality of life will improve Outcome: Progressing   Problem: Respiratory: Goal: Verbalizations of increased ease of respirations will increase Outcome: Progressing   Problem: Pain Management: Goal: Satisfaction with pain management regimen will improve Outcome: Progressing   Problem: Coping: Goal: Ability to identify and develop effective coping behavior will improve Outcome: Progressing   Problem: Clinical Measurements: Goal: Quality of life will improve Outcome: Progressing   Problem: Respiratory: Goal: Verbalizations of increased ease of respirations will increase Outcome: Progressing   Problem: Pain Management: Goal: Satisfaction with pain management regimen will improve Outcome: Progressing   Problem: Health Behavior/Discharge Planning: Goal: Ability to manage health-related needs will improve Outcome: Progressing   Problem: Clinical Measurements: Goal: Ability to maintain clinical measurements within normal limits will improve Outcome: Progressing Goal: Will remain free from infection Outcome: Progressing Goal: Diagnostic test results will improve Outcome: Progressing Goal: Respiratory complications will improve Outcome: Progressing Goal: Cardiovascular complication will be avoided Outcome: Progressing   Problem: Activity: Goal: Risk for activity intolerance will decrease Outcome: Progressing   Problem: Nutrition: Goal: Adequate nutrition will be maintained Outcome: Progressing   Problem: Coping: Goal: Level of anxiety will decrease Outcome: Progressing   Problem: Elimination: Goal: Will not experience complications related to bowel motility Outcome: Progressing Goal: Will not experience complications related to urinary retention Outcome: Progressing   Problem: Pain Management: Goal: General experience of comfort will improve Outcome: Progressing   Problem: Safety: Goal: Ability to remain free from injury  will improve Outcome: Progressing   Problem: Skin Integrity: Goal: Risk for impaired skin integrity will decrease Outcome: Progressing

## 2023-01-30 NOTE — Plan of Care (Signed)
  Problem: Education: Goal: Knowledge of the prescribed therapeutic regimen will improve Outcome: Not Progressing   Problem: Coping: Goal: Ability to identify and develop effective coping behavior will improve Outcome: Not Progressing   Problem: Clinical Measurements: Goal: Quality of life will improve Outcome: Not Progressing   Problem: Respiratory: Goal: Verbalizations of increased ease of respirations will increase Outcome: Not Progressing   Problem: Role Relationship: Goal: Family's ability to cope with current situation will improve Outcome: Not Progressing Goal: Ability to verbalize concerns, feelings, and thoughts to partner or family member will improve Outcome: Not Progressing   Problem: Pain Management: Goal: Satisfaction with pain management regimen will improve Outcome: Not Progressing   Problem: Education: Goal: Knowledge of General Education information will improve Description: Including pain rating scale, medication(s)/side effects and non-pharmacologic comfort measures Outcome: Not Progressing   Problem: Health Behavior/Discharge Planning: Goal: Ability to manage health-related needs will improve Outcome: Not Progressing   Problem: Clinical Measurements: Goal: Ability to maintain clinical measurements within normal limits will improve Outcome: Not Progressing Goal: Will remain free from infection Outcome: Not Progressing Goal: Diagnostic test results will improve Outcome: Not Progressing Goal: Respiratory complications will improve Outcome: Not Progressing Goal: Cardiovascular complication will be avoided Outcome: Not Progressing   Problem: Activity: Goal: Risk for activity intolerance will decrease Outcome: Not Progressing   Problem: Nutrition: Goal: Adequate nutrition will be maintained Outcome: Not Progressing   Problem: Coping: Goal: Level of anxiety will decrease Outcome: Not Progressing   Problem: Elimination: Goal: Will not  experience complications related to bowel motility Outcome: Not Progressing Goal: Will not experience complications related to urinary retention Outcome: Not Progressing   Problem: Pain Management: Goal: General experience of comfort will improve Outcome: Not Progressing   Problem: Safety: Goal: Ability to remain free from injury will improve Outcome: Not Progressing   Problem: Skin Integrity: Goal: Risk for impaired skin integrity will decrease Outcome: Not Progressing   Problem: Education: Goal: Knowledge of the prescribed therapeutic regimen will improve Outcome: Not Progressing   Problem: Coping: Goal: Ability to identify and develop effective coping behavior will improve Outcome: Not Progressing   Problem: Clinical Measurements: Goal: Quality of life will improve Outcome: Not Progressing   Problem: Respiratory: Goal: Verbalizations of increased ease of respirations will increase Outcome: Not Progressing   Problem: Role Relationship: Goal: Family's ability to cope with current situation will improve Outcome: Not Progressing Goal: Ability to verbalize concerns, feelings, and thoughts to partner or family member will improve Outcome: Not Progressing   Problem: Pain Management: Goal: Satisfaction with pain management regimen will improve Outcome: Not Progressing

## 2023-01-30 NOTE — Progress Notes (Signed)
  Progress Note   Patient: Isaac Hunter RUE:[id number] DOB: 1940/08/04 DOA: 01/17/2023     12 DOS: the patient was seen and examined on 01/30/2023   Brief hospital course: TORAINO SCOPEL is a 82 y.o. male with PMH of CAD, HTN,  type 2 diabetes mellitus, and tobacco abuse, dementia, who presented to the emergency room, from [facility] nephrology clinic, because of bradycardia and decreased responsiveness.  Reportedly, heart rate was between 20s and 40s.  He was also hypothermic (88.3 F) and hypotensive (82/32) in the ED.  He was admitted to the hospital for symptomatic bradycardia, hypotension, and AKI.  He was treated with IV fluids IV Levophed infusion. After discussion with patient's family they have made decision for comfort care measures only.  12/23: Patient appears comfortable, apparently had a bed at [facility] healthcare where he can go with hospice help-pending insurance authorization.  12/24: Remained hemodynamically stable and comfortable, awaiting LTC placement with hospice   Principal Problem:   Symptomatic bradycardia Active Problems:   Hypotension   Metabolic acidosis   Essential hypertension   Acute kidney injury superimposed on chronic kidney disease (HCC)   Pancytopenia (HCC)   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)   Hypothermia   Elevated troponin   Hypernatremia   Dementia with behavioral disturbance (HCC)   Assessment and Plan: Patient is comfortable, continue current care.  Pending transfer to long-term care with comfort.  Subjective:  Patient was resting comfortably when seen today.  He ate part of his breakfast this morning.  No new nursing concern.  Physical Exam: Vitals:   01/29/23 0802 01/29/23 1948 01/30/23 0414 01/30/23 1022  BP: (!) 129/118 (!) 153/117 (!) 161/92 118/86  Pulse: 74 71 86 74  Resp: 16 16 18 18   Temp: 97.6 F (36.4 C) (!) 97.4 F (36.3 C) 97.8 F (36.6 C)   TempSrc:  Axillary Oral   SpO2:  100% 100% 100% 99%  Weight:      Height:       General.  Frail and malnourished elderly man, in no acute distress. Pulmonary.  Lungs clear bilaterally, normal respiratory effort. CV.  Regular rate and rhythm, no JVD, rub or murmur. Abdomen.  Soft, nontender, nondistended, BS positive. CNS.  Alert and oriented to self.  No focal neurologic deficit. Extremities.  No edema, no cyanosis, pulses intact and symmetrical.    Data Reviewed:  There are no new results to review at this time.  Family Communication: Talked with son on phone.  Disposition: Status is: Inpatient Remains inpatient appropriate because: Comfort care.  Time spent: 39 minutes  This record has been created using Conservation officer, historic buildings. Errors have been sought and corrected,but may not always be located. Such creation errors do not reflect on the standard of care.   Author: Arnetha Courser, MD 01/30/2023 2:56 PM  For on call review [email or web address].

## 2023-01-30 NOTE — Progress Notes (Signed)
[facility] Liaison Note   Follow up on current hospice referral for services at LTC facility.     Hospital liaison continues for follow peripherally with collaboration with hospital medical team through final disposition.   Please call with any hospice related questions or concerns.   [facility] Liaison [phone]

## 2023-01-31 DIAGNOSIS — N179 Acute kidney failure, unspecified: Secondary | ICD-10-CM | POA: Diagnosis not present

## 2023-01-31 DIAGNOSIS — I959 Hypotension, unspecified: Secondary | ICD-10-CM | POA: Diagnosis not present

## 2023-01-31 DIAGNOSIS — D61818 Other pancytopenia: Secondary | ICD-10-CM | POA: Diagnosis not present

## 2023-01-31 DIAGNOSIS — R001 Bradycardia, unspecified: Secondary | ICD-10-CM | POA: Diagnosis not present

## 2023-01-31 NOTE — Progress Notes (Signed)
[facility] Liaison Note  Follow up on new hospice @ LTC referral who is awaiting LTC placement. Hospital Liaison Team will continue to follow through final disposition.  Please do not hesitate to call with any hospice related questions. Patient is not eligible for Inpatient Hospice Unit.  Norris Cross, RN Nurse Liaison [phone]

## 2023-01-31 NOTE — Plan of Care (Signed)
  Problem: Coping: Goal: Ability to identify and develop effective coping behavior will improve Outcome: Progressing   Problem: Respiratory: Goal: Verbalizations of increased ease of respirations will increase Outcome: Progressing   Problem: Role Relationship: Goal: Ability to verbalize concerns, feelings, and thoughts to partner or family member will improve Outcome: Progressing   Problem: Clinical Measurements: Goal: Diagnostic test results will improve Outcome: Progressing   Problem: Nutrition: Goal: Adequate nutrition will be maintained Outcome: Progressing

## 2023-01-31 NOTE — Progress Notes (Signed)
  Progress Note   Patient: Isaac Hunter ZOX:[id number] DOB: Apr 15, 1940 DOA: 01/17/2023     13 DOS: the patient was seen and examined on 01/31/2023   Brief hospital course: SIMRANJIT RAMBERG is a 82 y.o. male with PMH of CAD, HTN,  type 2 diabetes mellitus, and tobacco abuse, dementia, who presented to the emergency room, from [facility] nephrology clinic, because of bradycardia and decreased responsiveness.  Reportedly, heart rate was between 20s and 40s.  He was also hypothermic (88.3 F) and hypotensive (82/32) in the ED.  He was admitted to the hospital for symptomatic bradycardia, hypotension, and AKI.  He was treated with IV fluids IV Levophed infusion. After discussion with patient's family they have made decision for comfort care measures only.  12/23: Patient appears comfortable, apparently had a bed at [facility] healthcare where he can go with hospice help-pending insurance authorization.  12/24: Remained hemodynamically stable and comfortable, awaiting LTC placement with hospice.   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  Pending transfer to long-term care with comfort.  Subjective:  Patient was feeling hungry and asking to be fed when seen today.  Appears comfortable  Physical Exam: Vitals:   01/29/23 1948 01/30/23 0414 01/30/23 1022 01/30/23 2047  BP: (!) 153/117 (!) 161/92 118/86 (!) 158/75  Pulse: 71 86 74 100  Resp: 16 18 18 14   Temp: (!) 97.4 F (36.3 C) 97.8 F (36.6 C)  97.8 F (36.6 C)  TempSrc: Axillary Oral    SpO2: 100% 100% 99% 100%  Weight:       Height:       General.  Frail and malnourished elderly man, in no acute distress. Pulmonary.  Lungs clear bilaterally, normal respiratory effort. CV.  Regular rate and rhythm, no JVD, rub or murmur. Abdomen.  Soft, nontender, nondistended, BS positive. CNS.  Alert and oriented to self.  No focal neurologic deficit. Extremities.  No edema, no cyanosis, pulses intact and symmetrical.   Data Reviewed:  There are no new results to review at this time.  Family Communication:   Disposition: Status is: Inpatient Remains inpatient appropriate because: Comfort care.  Time spent: 38 minutes  This record has been created using Conservation officer, historic buildings. Errors have been sought and corrected,but may not always be located. Such creation errors do not reflect on the standard of care.   Author: Arnetha Courser, MD 01/31/2023 3:30 PM  For on call review [email or web address].

## 2023-02-01 DIAGNOSIS — N179 Acute kidney failure, unspecified: Secondary | ICD-10-CM | POA: Diagnosis not present

## 2023-02-01 DIAGNOSIS — D61818 Other pancytopenia: Secondary | ICD-10-CM | POA: Diagnosis not present

## 2023-02-01 DIAGNOSIS — R001 Bradycardia, unspecified: Secondary | ICD-10-CM | POA: Diagnosis not present

## 2023-02-01 DIAGNOSIS — I959 Hypotension, unspecified: Secondary | ICD-10-CM | POA: Diagnosis not present

## 2023-02-01 NOTE — Progress Notes (Signed)
  Progress Note   Patient: Isaac Hunter ZOX:[id number] DOB: 22-Jul-1940 DOA: 01/17/2023     14 DOS: the patient was seen and examined on 02/01/2023   Brief hospital course: Isaac Hunter is a 82 y.o. male with PMH of CAD, HTN,  type 2 diabetes mellitus, and tobacco abuse, dementia, who presented to the emergency room, from [facility] nephrology clinic, because of bradycardia and decreased responsiveness.  Reportedly, heart rate was between 20s and 40s.  He was also hypothermic (88.3 F) and hypotensive (82/32) in the ED.  He was admitted to the hospital for symptomatic bradycardia, hypotension, and AKI.  He was treated with IV fluids IV Levophed infusion. After discussion with patient's family they have made decision for comfort care measures only.  12/23: Patient appears comfortable, apparently had a bed at [facility] healthcare where he can go with hospice help-pending insurance authorization.  12/24: Remained hemodynamically stable and comfortable, awaiting LTC placement with hospice.  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  Pending transfer to long-term care with comfort.  Subjective:  Patient with no new concern when seen today.  Physical Exam: Vitals:   01/30/23 2047 01/31/23 2231 02/01/23 0946 02/01/23 0949  BP: (!) 158/75 (!) 150/93 (!) 165/69 (!) 165/69  Pulse: 100 74 83   Resp: 14 18 18    Temp: 97.8 F (36.6 C) 98 F (36.7 C) (!) 97.5 F (36.4 C)   TempSrc:   Axillary   SpO2: 100% 100% 100%   Weight:       Height:       General.  Frail and malnourished gentleman ,in no acute distress. Pulmonary.  Lungs clear bilaterally, normal respiratory effort. CV.  Regular rate and rhythm, no JVD, rub or murmur. Abdomen.  Soft, nontender, nondistended, BS positive. CNS.  Alert and oriented to name only.  No focal neurologic deficit. Extremities.  No edema, no cyanosis, pulses intact and symmetrical..   Data Reviewed:  There are no new results to review at this time.  Family Communication: Called son with no response  Disposition: Status is: Inpatient Remains inpatient appropriate because: Comfort care.  Time spent: 37 minutes  This record has been created using Conservation officer, historic buildings. Errors have been sought and corrected,but may not always be located. Such creation errors do not reflect on the standard of care.   Author: Arnetha Courser, MD 02/01/2023 2:43 PM  For on call review [email or web address].

## 2023-02-01 NOTE — Plan of Care (Signed)
Pt confused, unable to communicate needs, comfort measures in place. Pt is a feeder on soft diet.  Purewick  in place and maintained.  Problem: Education: Goal: Knowledge of the prescribed therapeutic regimen will improve Outcome: Not Progressing   Problem: Coping: Goal: Ability to identify and develop effective coping behavior will improve Outcome: Not Progressing   Problem: Clinical Measurements: Goal: Quality of life will improve Outcome: Not Progressing   Problem: Respiratory: Goal: Verbalizations of increased ease of respirations will increase Outcome: Not Progressing   Problem: Role Relationship: Goal: Family's ability to cope with current situation will improve Outcome: Not Progressing Goal: Ability to verbalize concerns, feelings, and thoughts to partner or family member will improve Outcome: Not Progressing   Problem: Education: Goal: Knowledge of General Education information will improve Description: Including pain rating scale, medication(s)/side effects and non-pharmacologic comfort measures Outcome: Not Progressing   Problem: Health Behavior/Discharge Planning: Goal: Ability to manage health-related needs will improve Outcome: Not Progressing   Problem: Clinical Measurements: Goal: Ability to maintain clinical measurements within normal limits will improve Outcome: Not Progressing Goal: Will remain free from infection Outcome: Not Progressing Goal: Diagnostic test results will improve Outcome: Not Progressing Goal: Respiratory complications will improve Outcome: Not Progressing Goal: Cardiovascular complication will be avoided Outcome: Not Progressing   Problem: Activity: Goal: Risk for activity intolerance will decrease Outcome: Not Progressing   Problem: Nutrition: Goal: Adequate nutrition will be maintained Outcome: Not Progressing   Problem: Coping: Goal: Level of anxiety will decrease Outcome: Not Progressing   Problem: Elimination: Goal:  Will not experience complications related to bowel motility Outcome: Not Progressing Goal: Will not experience complications related to urinary retention Outcome: Not Progressing   Problem: Pain Management: Goal: General experience of comfort will improve Outcome: Not Progressing   Problem: Safety: Goal: Ability to remain free from injury will improve Outcome: Not Progressing   Problem: Skin Integrity: Goal: Risk for impaired skin integrity will decrease Outcome: Not Progressing   Problem: Clinical Measurements: Goal: Quality of life will improve Outcome: Not Progressing

## 2023-02-01 NOTE — Progress Notes (Signed)
[facility] Liaison Note   Follow up on new hospice @ LTC referral who is awaiting LTC placement. Hospital Liaison Team will continue to follow through final disposition.   Please do not hesitate to call with any hospice related questions. Patient is not eligible for Inpatient Hospice Unit.   [facility] Liaison [phone]

## 2023-02-01 NOTE — TOC Progression Note (Signed)
Transition of Care [facility]) - Progression Note    Patient Details  Name: Isaac Hunter MRN: [id number] Date of Birth: July 25, 1940  Transition of Care [facility]) CM/SW Contact  Allena Katz, LCSW Phone Number: 02/01/2023, 3:32 PM  Clinical Narrative:   CSW tried to leave message with son to see if pt could go to [facility] tomorrow. Wife answered the phone and said he was at work but she would give him the message to give me a call.          Expected Discharge Plan and Services                                               Social Determinants of Health (SDOH) Interventions SDOH Screenings   Food Insecurity: No Food Insecurity (01/18/2023)  Housing: Unknown (01/18/2023)  Transportation Needs: No Transportation Needs (01/18/2023)  Utilities: Not At Risk (01/18/2023)  Financial Resource Strain: Low Risk  (12/03/2022)   Received from [facility]  Tobacco Use: High Risk (12/16/2022)   Received from [facility]    Readmission Risk Interventions    09/02/2021    3:23 PM  Readmission Risk Prevention Plan  Transportation Screening Complete  PCP or Specialist Appt within 5-7 Days Not Complete  Home Care Screening Complete  Medication Review (RN CM) Complete

## 2023-02-02 DIAGNOSIS — I959 Hypotension, unspecified: Secondary | ICD-10-CM | POA: Diagnosis not present

## 2023-02-02 DIAGNOSIS — R001 Bradycardia, unspecified: Secondary | ICD-10-CM | POA: Diagnosis not present

## 2023-02-02 DIAGNOSIS — N179 Acute kidney failure, unspecified: Secondary | ICD-10-CM | POA: Diagnosis not present

## 2023-02-02 DIAGNOSIS — D61818 Other pancytopenia: Secondary | ICD-10-CM | POA: Diagnosis not present

## 2023-02-02 NOTE — TOC Progression Note (Signed)
Transition of Care [facility]) - Progression Note    Patient Details  Name: Isaac Hunter MRN: [id number] Date of Birth: 1940/05/13  Transition of Care [facility]) CM/SW Contact  Allena Katz, LCSW Phone Number: 02/02/2023, 11:52 AM  Clinical Narrative:   CSW spoke to son. [facility]. [facility]ained [facility]. He wants to speak with his other brother and call me right back.         Expected Discharge Plan and Services                                               Social Determinants of Health (SDOH) Interventions SDOH Screenings   Food Insecurity: No Food Insecurity (01/18/2023)  Housing: Unknown (01/18/2023)  Transportation Needs: No Transportation Needs (01/18/2023)  Utilities: Not At Risk (01/18/2023)  Financial Resource Strain: Low Risk  (12/03/2022)   Received from [facility]  Tobacco Use: High Risk (12/16/2022)   Received from [facility]    Readmission Risk Interventions    09/02/2021    3:23 PM  Readmission Risk Prevention Plan  Transportation Screening Complete  PCP or Specialist Appt within 5-7 Days Not Complete  Home Care Screening Complete  Medication Review (RN CM) Complete

## 2023-02-02 NOTE — Progress Notes (Signed)
  Progress Note   Patient: Isaac Hunter JXB:[id number] DOB: March 18, 1940 DOA: 01/17/2023     15 DOS: the patient was seen and examined on 02/02/2023   Brief hospital course: ANAS KEYE is a 82 y.o. male with PMH of CAD, HTN,  type 2 diabetes mellitus, and tobacco abuse, dementia, who presented to the emergency room, from [facility] nephrology clinic, because of bradycardia and decreased responsiveness.  Reportedly, heart rate was between 20s and 40s.  He was also hypothermic (88.3 F) and hypotensive (82/32) in the ED.  He was admitted to the hospital for symptomatic bradycardia, hypotension, and AKI.  He was treated with IV fluids IV Levophed infusion. After discussion with patient's family they have made decision for comfort care measures only.  12/23: Patient appears comfortable, apparently had a bed at [facility] healthcare where he can go with hospice help-pending insurance authorization.  12/24: Remained hemodynamically stable and comfortable, awaiting LTC placement with hospice.  12/26: Remains stable, still awaiting placement  12/27: Remained hemodynamically stable, having difficulty with family to decide about LTC.   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 Pending transfer to long-term care with comfort   Subjective:  Patient was asking to be fed when seen today.  He was feeling hungry.  Denies any pain.  Physical Exam: Vitals:   02/01/23 0949 02/01/23 2242 02/02/23 1135 02/02/23 1733  BP: (!) 165/69 (!) 112/54 (!) 152/82 (!) 136/102  Pulse:  (!) 58 78 87   Resp:  20 16   Temp:  97.8 F (36.6 C) 97.9 F (36.6 C)   TempSrc:      SpO2:   100%   Weight:      Height:       General.  Frail and malnourished elderly man, in no acute distress. Pulmonary.  Lungs clear bilaterally, normal respiratory effort. CV.  Regular rate and rhythm, no JVD, rub or murmur. Abdomen.  Soft, nontender, nondistended, BS positive. CNS.  Alert and oriented to self only.  No focal neurologic deficit. Extremities.  No edema, no cyanosis, pulses intact and symmetrical.   Data Reviewed:  There are no new results to review at this time.  Family Communication:   Disposition: Status is: Inpatient Remains inpatient appropriate because: Comfort care.  Time spent: 36 minutes  This record has been created using Conservation officer, historic buildings. Errors have been sought and corrected,but may not always be located. Such creation errors do not reflect on the standard of care.   Author: Arnetha Courser, MD 02/02/2023 6:23 PM  For on call review [email or web address].

## 2023-02-02 NOTE — Plan of Care (Signed)
  Problem: Education: Goal: Knowledge of the prescribed therapeutic regimen will improve Outcome: Progressing   Problem: Coping: Goal: Ability to identify and develop effective coping behavior will improve Outcome: Progressing   Problem: Clinical Measurements: Goal: Quality of life will improve Outcome: Progressing   Problem: Respiratory: Goal: Verbalizations of increased ease of respirations will increase Outcome: Progressing   Problem: Clinical Measurements: Goal: Quality of life will improve Outcome: Progressing

## 2023-02-02 NOTE — Progress Notes (Signed)
 [facility] Liaison Note   Follow up on new hospice @ LTC referral who is awaiting LTC placement. Hospital Liaison Team will continue to follow through final disposition.   Please do not hesitate to call with any hospice related questions. Patient is not eligible for Inpatient Hospice Unit.   [facility] Liaison [phone]

## 2023-02-03 DIAGNOSIS — N179 Acute kidney failure, unspecified: Secondary | ICD-10-CM | POA: Diagnosis not present

## 2023-02-03 DIAGNOSIS — I959 Hypotension, unspecified: Secondary | ICD-10-CM | POA: Diagnosis not present

## 2023-02-03 DIAGNOSIS — R001 Bradycardia, unspecified: Secondary | ICD-10-CM | POA: Diagnosis not present

## 2023-02-03 DIAGNOSIS — D61818 Other pancytopenia: Secondary | ICD-10-CM | POA: Diagnosis not present

## 2023-02-03 NOTE — Progress Notes (Signed)
  Progress Note   Patient: Isaac Hunter JYN:[id number] DOB: 08/17/1940 DOA: 01/17/2023     16 DOS: the patient was seen and examined on 02/03/2023   Brief hospital course: Isaac Hunter is a 82 y.o. male with PMH of CAD, HTN,  type 2 diabetes mellitus, and tobacco abuse, dementia, who presented to the emergency room, from [facility] nephrology clinic, because of bradycardia and decreased responsiveness.  Reportedly, heart rate was between 20s and 40s.  He was also hypothermic (88.3 F) and hypotensive (82/32) in the ED.  He was admitted to the hospital for symptomatic bradycardia, hypotension, and AKI.  He was treated with IV fluids IV Levophed infusion. After discussion with patient's family they have made decision for comfort care measures only.  12/23: Patient appears comfortable, apparently had a bed at [facility] healthcare where he can go with hospice help-pending insurance authorization.  12/24: Remained hemodynamically stable and comfortable, awaiting LTC placement with hospice.  12/26: Remains stable, still awaiting placement  12/27: Remained hemodynamically stable, having difficulty with family to decide about LTC.  12/28:Remained stable, family decided on [facility]- pending insurance authorization.   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 Pending transfer to long-term care with comfort   Subjective:  Patient with no new concern.  Physical Exam: Vitals:   02/02/23 1135 02/02/23 1733 02/02/23 1951 02/03/23 0737  BP: (!) 152/82 (!) 136/102 123/64 (!) 143/63  Pulse:  78 87 79 69  Resp: 16  18 16   Temp: 97.9 F (36.6 C)  98.1 F (36.7 C) (!) 97.4 F (36.3 C)  TempSrc:      SpO2: 100%  95% 100%  Weight:      Height:       General.  Frail and malnourished elderly man, in no acute distress. Pulmonary.  Lungs clear bilaterally, normal respiratory effort. CV.  Regular rate and rhythm, no JVD, rub or murmur. Abdomen.  Soft, nontender, nondistended, BS positive. CNS.  Alert and oriented to self only.  No focal neurologic deficit. Extremities.  No edema, no cyanosis, pulses intact and symmetrical.  Data Reviewed:  There are no new results to review at this time.  Family Communication:   Disposition: Status is: Inpatient Remains inpatient appropriate because: Comfort care.  Time spent: 37 minutes  This record has been created using Conservation officer, historic buildings. Errors have been sought and corrected,but may not always be located. Such creation errors do not reflect on the standard of care.   Author: Arnetha Courser, MD 02/03/2023 3:26 PM  For on call review [email or web address].

## 2023-02-03 NOTE — TOC Progression Note (Addendum)
Transition of Care [facility]) - Progression Note    Patient Details  Name: Isaac Hunter MRN: [id number] Date of Birth: 11/06/1940  Transition of Care [facility] CM/SW Contact  Rodney Langton, RN Phone Number: 02/03/2023, 2:08 PM  Clinical Narrative:     Spoke with patient's son, [name], agrees to placement at [facility], MD aware, will plan for discharge on Monday. Will follow up with [facility] to confirm when patient can be discharged.        Expected Discharge Plan and Services                                               Social Determinants of Health (SDOH) Interventions SDOH Screenings   Food Insecurity: No Food Insecurity (01/18/2023)  Housing: Unknown (01/18/2023)  Transportation Needs: No Transportation Needs (01/18/2023)  Utilities: Not At Risk (01/18/2023)  Financial Resource Strain: Low Risk  (12/03/2022)   Received from [facility]  Tobacco Use: High Risk (12/16/2022)   Received from [facility]    Readmission Risk Interventions    09/02/2021    3:23 PM  Readmission Risk Prevention Plan  Transportation Screening Complete  PCP or Specialist Appt within 5-7 Days Not Complete  Home Care Screening Complete  Medication Review (RN CM) Complete

## 2023-02-03 NOTE — Plan of Care (Addendum)
Patient is alert and oriented X 1. Denies any pain.   Problem: Education: Goal: Knowledge of the prescribed therapeutic regimen will improve Outcome: Progressing   Problem: Coping: Goal: Ability to identify and develop effective coping behavior will improve Outcome: Progressing   Problem: Clinical Measurements: Goal: Quality of life will improve Outcome: Progressing   Problem: Respiratory: Goal: Verbalizations of increased ease of respirations will increase Outcome: Progressing   Problem: Role Relationship: Goal: Family's ability to cope with current situation will improve Outcome: Progressing Goal: Ability to verbalize concerns, feelings, and thoughts to partner or family member will improve Outcome: Progressing   Problem: Education: Goal: Knowledge of General Education information will improve Description: Including pain rating scale, medication(s)/side effects and non-pharmacologic comfort measures Outcome: Progressing   Problem: Health Behavior/Discharge Planning: Goal: Ability to manage health-related needs will improve Outcome: Progressing   Problem: Clinical Measurements: Goal: Ability to maintain clinical measurements within normal limits will improve Outcome: Progressing Goal: Will remain free from infection Outcome: Progressing Goal: Diagnostic test results will improve Outcome: Progressing Goal: Respiratory complications will improve Outcome: Progressing Goal: Cardiovascular complication will be avoided Outcome: Progressing   Problem: Activity: Goal: Risk for activity intolerance will decrease Outcome: Progressing   Problem: Nutrition: Goal: Adequate nutrition will be maintained Outcome: Progressing   Problem: Coping: Goal: Level of anxiety will decrease Outcome: Progressing   Problem: Elimination: Goal: Will not experience complications related to bowel motility Outcome: Progressing Goal: Will not experience complications related to urinary  retention Outcome: Progressing   Problem: Pain Management: Goal: General experience of comfort will improve Outcome: Progressing   Problem: Safety: Goal: Ability to remain free from injury will improve Outcome: Progressing   Problem: Skin Integrity: Goal: Risk for impaired skin integrity will decrease Outcome: Progressing   Problem: Clinical Measurements: Goal: Quality of life will improve Outcome: Progressing

## 2023-02-04 DIAGNOSIS — I959 Hypotension, unspecified: Secondary | ICD-10-CM | POA: Diagnosis not present

## 2023-02-04 DIAGNOSIS — D61818 Other pancytopenia: Secondary | ICD-10-CM | POA: Diagnosis not present

## 2023-02-04 DIAGNOSIS — N179 Acute kidney failure, unspecified: Secondary | ICD-10-CM | POA: Diagnosis not present

## 2023-02-04 DIAGNOSIS — R001 Bradycardia, unspecified: Secondary | ICD-10-CM | POA: Diagnosis not present

## 2023-02-04 NOTE — Progress Notes (Signed)
°  Progress Note   Patient: Isaac Hunter:[id number] DOB: [birth year] DOA: 01/17/2023     17 DOS: the patient was seen and examined on 02/04/2023   Brief hospital course: Isaac Hunter is a 82 y.o. male with PMH of CAD, HTN,  type 2 diabetes mellitus, and tobacco abuse, dementia, who presented to the emergency room, from [facility] nephrology clinic, because of bradycardia and decreased responsiveness.  Reportedly, heart rate was between 20s and 40s.  He was also hypothermic (88.3 F) and hypotensive (82/32) in the ED.  He was admitted to the hospital for symptomatic bradycardia, hypotension, and AKI.  He was treated with IV fluids IV Levophed infusion. After discussion with patient's family they have made decision for comfort care measures only.  12/23: Patient appears comfortable, apparently had a bed at [facility] healthcare where he can go with hospice help-pending insurance authorization.  12/24: Remained hemodynamically stable and comfortable, awaiting LTC placement with hospice.  12/26: Remains stable, still awaiting placement  12/27: Remained hemodynamically stable, having difficulty with family to decide about LTC.  12/28:Remained stable, family decided on [facility]- pending insurance authorization.   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 Pending transfer to long-term care with comfort   Subjective:  Patient was asking for something to eat when seen today.  No other concern.  Physical Exam: Vitals:   02/02/23 1951 02/03/23 0737 02/03/23 2101 02/04/23 1129  BP:  123/64 (!) 143/63 113/74 136/60  Pulse: 79 69 78 79  Resp: 18 16 18 16   Temp: 98.1 F (36.7 C) (!) 97.4 F (36.3 C) (!) 97.4 F (36.3 C)   TempSrc:      SpO2: 95% 100% 100% 100%  Weight:      Height:       General.  Frail and malnourished elderly man, in no acute distress. Pulmonary.  Lungs clear bilaterally, normal respiratory effort. CV.  Regular rate and rhythm, no JVD, rub or murmur. Abdomen.  Soft, nontender, nondistended, BS positive. CNS.  Alert and oriented .  No focal neurologic deficit. Extremities.  No edema, no cyanosis, pulses intact and symmetrical.  Data Reviewed:  There are no new results to review at this time.  Family Communication:   Disposition: Status is: Inpatient Remains inpatient appropriate because: Comfort care.  Time spent: 36 minutes  This record has been created using Conservation officer, historic buildings. Errors have been sought and corrected,but may not always be located. Such creation errors do not reflect on the standard of care.   Author: Arnetha Courser, MD 02/04/2023 12:50 PM  For on call review [email or web address].

## 2023-02-04 NOTE — Progress Notes (Signed)
[facility] Liaison Note  Follow up on new hospice referral awaiting LTC bed offer.  Patient's family has accepted LTC bed at [facility].  Pending discharge plan is to discharge to [facility] facility on Monday.  Hospital Liaison Team will continue to collaborate with hospital medical team through final disposition.  Please do not hesitate to call with any hospice related questions or concerns.  Norris Cross, RN Nurse Liaison [phone]

## 2023-02-05 DIAGNOSIS — I959 Hypotension, unspecified: Secondary | ICD-10-CM | POA: Diagnosis not present

## 2023-02-05 DIAGNOSIS — D61818 Other pancytopenia: Secondary | ICD-10-CM | POA: Diagnosis not present

## 2023-02-05 DIAGNOSIS — R001 Bradycardia, unspecified: Secondary | ICD-10-CM | POA: Diagnosis not present

## 2023-02-05 DIAGNOSIS — N179 Acute kidney failure, unspecified: Secondary | ICD-10-CM | POA: Diagnosis not present

## 2023-02-05 NOTE — TOC Progression Note (Signed)
Transition of Care [facility]) - Progression Note    Patient Details  Name: MELVERN FAUBION MRN: [id number] Date of Birth: 08-23-1940  Transition [facility] Care [facility]) CM/SW Contact  Allena Katz, LCSW Phone Number: 02/05/2023, 11:28 AM  Clinical Narrative:   Tanya with [facility] reports pt can come as soon as son goes there to sign over patients check.         Expected Discharge Plan and Services                                               Social Determinants of Health (SDOH) Interventions SDOH Screenings   Food Insecurity: No Food Insecurity (01/18/2023)  Housing: Unknown (01/18/2023)  Transportation Needs: No Transportation Needs (01/18/2023)  Utilities: Not At Risk (01/18/2023)  Financial Resource Strain: Low Risk  (12/03/2022)   Received from [facility]  Tobacco Use: High Risk (12/16/2022)   Received from [facility]    Readmission Risk Interventions    09/02/2021    3:23 PM  Readmission Risk Prevention Plan  Transportation Screening Complete  PCP or Specialist Appt within 5-7 Days Not Complete  Home Care Screening Complete  Medication Review (RN CM) Complete

## 2023-02-05 NOTE — Progress Notes (Signed)
 [facility] Liaison Note   Follow up on new hospice @ LTC referral who is awaiting LTC placement. Hospital Liaison Team will continue to follow through final disposition.   Please do not hesitate to call with any hospice related questions. Patient is not eligible for Inpatient Hospice Unit.   [facility] Liaison [phone]

## 2023-02-06 DIAGNOSIS — N184 Chronic kidney disease, stage 4 (severe): Secondary | ICD-10-CM

## 2023-02-06 DIAGNOSIS — R001 Bradycardia, unspecified: Secondary | ICD-10-CM | POA: Diagnosis not present

## 2023-02-06 DIAGNOSIS — E870 Hyperosmolality and hypernatremia: Secondary | ICD-10-CM

## 2023-02-06 DIAGNOSIS — T68XXXA Hypothermia, initial encounter: Secondary | ICD-10-CM | POA: Diagnosis not present

## 2023-02-06 DIAGNOSIS — I959 Hypotension, unspecified: Secondary | ICD-10-CM | POA: Diagnosis not present

## 2023-02-06 DIAGNOSIS — N179 Acute kidney failure, unspecified: Secondary | ICD-10-CM | POA: Diagnosis not present

## 2023-02-06 DIAGNOSIS — R7989 Other specified abnormal findings of blood chemistry: Secondary | ICD-10-CM

## 2023-02-06 MED ORDER — LORAZEPAM 2 MG/ML PO CONC: 1.0000 mg | ORAL | 0 refills | Status: DC | PRN

## 2023-02-06 MED ORDER — GLYCOPYRROLATE 1 MG PO TABS: 1.0000 mg | ORAL_TABLET | ORAL | 0 refills | Status: DC | PRN

## 2023-02-06 MED ORDER — ACETAMINOPHEN 325 MG PO TABS: 650.0000 mg | ORAL_TABLET | Freq: Four times a day (QID) | ORAL | Status: DC | PRN

## 2023-02-06 MED ORDER — BIOTENE DRY MOUTH MT LIQD: 15.0000 mL | OROMUCOSAL | Status: DC | PRN

## 2023-02-06 MED ORDER — POLYVINYL ALCOHOL 1.4 % OP SOLN: 1.0000 [drp] | Freq: Four times a day (QID) | OPHTHALMIC | Status: DC | PRN

## 2023-02-06 MED ORDER — ONDANSETRON 4 MG PO TBDP: 4.0000 mg | ORAL_TABLET | Freq: Four times a day (QID) | ORAL | Status: DC | PRN

## 2023-02-06 MED ORDER — HYDROMORPHONE HCL 2 MG PO TABS: 2.0000 mg | ORAL_TABLET | ORAL | 0 refills | Status: DC | PRN

## 2023-02-06 MED ORDER — TRAZODONE HCL 50 MG PO TABS: 25.0000 mg | ORAL_TABLET | Freq: Every evening | ORAL | 0 refills | Status: DC | PRN

## 2023-02-06 NOTE — Plan of Care (Signed)
  Problem: Coping: Goal: Ability to identify and develop effective coping behavior will improve Outcome: Progressing   

## 2023-02-06 NOTE — Progress Notes (Signed)
 Patient transported off the unit via EMS. Patient belongings transported with him to include his watch and his bag with clothing in it. Prior to transport RN assisted the patient in drinking 3 cups of ice water per his request. Patient safety maintained.

## 2023-02-06 NOTE — Plan of Care (Signed)
  Problem: Education: Goal: Knowledge of the prescribed therapeutic regimen will improve Outcome: Progressing   Problem: Coping: Goal: Ability to identify and develop effective coping behavior will improve Outcome: Progressing   Problem: Clinical Measurements: Goal: Quality of life will improve Outcome: Progressing   Problem: Respiratory: Goal: Verbalizations of increased ease of respirations will increase Outcome: Progressing   Problem: Clinical Measurements: Goal: Quality of life will improve Outcome: Progressing

## 2023-02-06 NOTE — TOC Transition Note (Signed)
 Transition of Care [facility]) - Discharge Note   Patient Details  Name: Isaac Hunter MRN: [id number] Date of Birth: [birth year]  [facility] CM/SW Contact:  Ladene Lady, LCSW Phone Number: 02/06/2023, 11:58 AM   Clinical Narrative:   Pt has orders to discharge to [facility] for [facility]. RN given number for report. Son notified.    Final next level of care: Skilled Nursing Facility Barriers to Discharge: Barriers Resolved   Patient Goals and CMS Choice Patient states their goals for this hospitalization and ongoing recovery are:: go to [facility]          Discharge Placement              Patient chooses bed at: [facility] Patient to be transferred to facility by: [facility] Name of family member notified: son Patient and family notified of of transfer: 02/06/23  Discharge Plan and Services Additional resources added to the After Visit Summary for                                       Social Drivers of Health (SDOH) Interventions SDOH Screenings   Food Insecurity: No Food Insecurity (01/18/2023)  Housing: Unknown (01/18/2023)  Transportation Needs: No Transportation Needs (01/18/2023)  Utilities: Not At Risk (01/18/2023)  Financial Resource Strain: Low Risk  (12/03/2022)   Received from [facility]  Tobacco Use: High Risk (12/16/2022)   Received from [facility]     Readmission Risk Interventions    09/02/2021    3:23 PM  Readmission Risk Prevention Plan  Transportation Screening Complete  PCP or Specialist Appt within 5-7 Days Not Complete  Home Care Screening Complete  Medication Review (RN CM) Complete

## 2023-02-06 NOTE — NC FL2 (Signed)
 [place]  MEDICAID FL2 LEVEL OF CARE FORM     IDENTIFICATION  Patient Name: Isaac Hunter Birthdate: Jul 29, 1940 Sex: male Admission Date (Current Location): 01/17/2023  [facility] and [place] Number:  Chiropodist and Address:  [facility], [address], [place], [place] [address]      Provider Number: [id number]  Attending Physician Name and Address:  Caleen Qualia, MD  Relative Name and Phone Number:  Tanda Lyndy Rhody)  [phone]    Current Level of Care: SNF Recommended Level of Care: Skilled Nursing Facility Prior Approval Number:    Date Approved/Denied:   PASRR Number: [id number]  Discharge Plan: SNF    Current Diagnoses: Patient Active Problem List   Diagnosis Date Noted   Hypothermia 01/27/2023   Elevated troponin 01/27/2023   Hypernatremia 01/27/2023   Dementia with behavioral disturbance (HCC) 01/27/2023   Hypotension 01/18/2023   Symptomatic bradycardia 01/18/2023   Heme positive stool 10/11/2022   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) 10/11/2022   Pancytopenia (HCC) 10/06/2022   GI bleed 10/06/2022   Altered mental status 03/05/2022   UTI (urinary tract infection) 03/05/2022   Microcytic anemia 03/05/2022   Iron deficiency anemia 03/05/2022   CKD (chronic kidney disease) stage 4, GFR 15-29 ml/min (HCC) 03/05/2022   Severe sepsis (HCC) 09/01/2021   Hyperkalemia 09/01/2021   Hyperglycemia 09/01/2021   Transaminasemia 09/01/2021   Acute kidney injury superimposed on chronic kidney disease (HCC) 09/01/2021   Mixed hyperlipidemia 09/01/2021   Insomnia 09/01/2021   Gross hematuria 11/17/2010   Anemia due to blood loss 11/17/2010   Thrombocytopenia (HCC) 11/17/2010   Sinus tachycardia 11/17/2010   Metabolic acidosis 11/17/2010   Essential hypertension 11/17/2010   Bladder mass 11/17/2010   DM type 2 (diabetes mellitus, type 2) (HCC) 11/17/2010   Tobacco abuse 11/17/2010     Orientation RESPIRATION BLADDER Height & Weight     Self  Normal Incontinent, External catheter Weight: 135 lb (61.2 kg) Height:  5' 7 (170.2 cm)  BEHAVIORAL SYMPTOMS/MOOD NEUROLOGICAL BOWEL NUTRITION STATUS      Incontinent Diet  AMBULATORY STATUS COMMUNICATION OF NEEDS Skin   Extensive Assist Verbally Normal                       Personal Care Assistance Level of Assistance  Bathing, Total care, Feeding, Dressing Bathing Assistance: Maximum assistance Feeding assistance: Maximum assistance Dressing Assistance: Maximum assistance Total Care Assistance: Maximum assistance   Functional Limitations Info             SPECIAL CARE FACTORS FREQUENCY                       Contractures Contractures Info: Not present    Additional Factors Info  Code Status Code Status Info: DNR             Current Medications (02/06/2023):  This is the current hospital active medication list Current Facility-Administered Medications  Medication Dose Route Frequency Provider Last Rate Last Admin   acetaminophen  (TYLENOL ) tablet 650 mg  650 mg Oral Q6H PRN Mansy, Jan A, MD       Or   acetaminophen  (TYLENOL ) suppository 650 mg  650 mg Rectal Q6H PRN Mansy, Jan A, MD       amLODipine  (NORVASC ) tablet 10 mg  10 mg Oral Daily Amin, Sumayya, MD   10 mg at 02/06/23 9047   antiseptic oral rinse (BIOTENE) solution 15 mL  15 mL Topical PRN Arvid Collar, FNP       glycopyrrolate  (ROBINUL ) tablet 1 mg  1 mg Oral Q4H PRN Arvid Collar, FNP       Or   glycopyrrolate  (ROBINUL ) injection 0.2 mg  0.2 mg Subcutaneous Q4H PRN Arvid Collar, FNP       Or   glycopyrrolate  (ROBINUL ) injection 0.2 mg  0.2 mg Intravenous Q4H PRN Arvid Collar, FNP       HYDROmorphone  (DILAUDID ) injection 0.5 mg  0.5 mg Intravenous Q2H PRN Arvid Collar, FNP       LORazepam  (ATIVAN ) tablet 1 mg  1 mg Oral Q4H PRN Mansy, Jan A, MD   1 mg at 01/27/23 9063   Or   LORazepam  (ATIVAN ) 2 MG/ML concentrated  solution 1 mg  1 mg Sublingual Q4H PRN Mansy, Jan A, MD       Or   LORazepam  (ATIVAN ) injection 1 mg  1 mg Intravenous Q4H PRN Mansy, Jan A, MD       ondansetron  (ZOFRAN -ODT) disintegrating tablet 4 mg  4 mg Oral Q6H PRN Mansy, Jan A, MD       Or   ondansetron  (ZOFRAN ) injection 4 mg  4 mg Intravenous Q6H PRN Mansy, Jan A, MD       polyvinyl alcohol  (LIQUIFILM TEARS) 1.4 % ophthalmic solution 1 drop  1 drop Both Eyes QID PRN Arvid Collar, FNP       traZODone  (DESYREL ) tablet 25 mg  25 mg Oral QHS PRN Mansy, Madison LABOR, MD         Discharge Medications: Please see discharge summary for a list of discharge medications.    STOP taking these medications     aspirin  EC 81 MG tablet    atorvastatin  80 MG tablet Commonly known as: LIPITOR    carvedilol  6.25 MG tablet Commonly known as: Coreg     Cholecalciferol 25 MCG (1000 UT) tablet    FeroSul 325 (65 Fe) MG tablet Generic drug: ferrous sulfate     furosemide  20 MG tablet Commonly known as: LASIX            TAKE these medications     acetaminophen  325 MG tablet Commonly known as: TYLENOL  Take 2 tablets (650 mg total) by mouth every 6 (six) hours as needed for mild pain (pain score 1-3) (or Fever >/= 101).    amLODipine  10 MG tablet Commonly known as: NORVASC  Take 1 tablet (10 mg total) by mouth daily. For BP    antiseptic oral rinse Liqd Apply 15 mLs topically as needed for dry mouth.    glycopyrrolate  1 MG tablet Commonly known as: ROBINUL  Take 1 tablet (1 mg total) by mouth every 4 (four) hours as needed (excessive secretions).    HYDROmorphone  2 MG tablet Commonly known as: DILAUDID  Take 1 tablet (2 mg total) by mouth every 4 (four) hours as needed for severe pain (pain score 7-10). May crush, mix with water  and give sublingually if needed.    LORazepam  2 MG/ML concentrated solution Commonly known as: ATIVAN  Place 0.5 mLs (1 mg total) under the tongue every 4 (four) hours as needed for anxiety.    ondansetron  4  MG disintegrating tablet Commonly known as: ZOFRAN -ODT Take 1 tablet (4 mg total) by mouth every 6 (six) hours as needed for nausea.    polyvinyl alcohol  1.4 % ophthalmic solution Commonly known as: LIQUIFILM TEARS Place 1 drop into both eyes 4 (four) times daily as needed for dry eyes.    traZODone  50  MG tablet Commonly known as: DESYREL  Take 0.5 tablets (25 mg total) by mouth at bedtime as needed for sleep.          Relevant Imaging Results:  Relevant Lab Results:   Additional Information SSN=[id number], plan SNF with hospice support  Ladene Lady, LCSW

## 2023-02-06 NOTE — Progress Notes (Signed)
 RN and NT attempted to change the patient's bed linen and his gown because patient had his linen half off the bed and his gown was only on one arm. Patient refused to let this RN and NT assist in changing his linen he held he arm tight around the gown. RN adjusted the linen that was on the bed for the patient. Patient left in bed with call bell in reach bed locked in lowest position.

## 2023-02-06 NOTE — Progress Notes (Addendum)
1250 attempted to given report to [facility] Provided call back number for nurse to call.   1325 second attempt to call report, unit clerk could not locate RN for report, will attempt again in 15 min  1400 Report given to Edson Snowball, Charity fundraiser. All questions answered

## 2023-02-06 NOTE — Progress Notes (Signed)
  Progress Note   Patient: Isaac Hunter:[id number] DOB: 03-Jan-1941 DOA: 01/17/2023     19 DOS: the patient was seen and examined on 02/06/2023   Brief hospital course: Isaac Hunter is a 82 y.o. male with PMH of CAD, HTN,  type 2 diabetes mellitus, and tobacco abuse, dementia, who presented to the emergency room, from [facility] nephrology clinic, because of bradycardia and decreased responsiveness.  Reportedly, heart rate was between 20s and 40s.  He was also hypothermic (88.3 F) and hypotensive (82/32) in the ED.  He was admitted to the hospital for symptomatic bradycardia, hypotension, and AKI.  He was treated with IV fluids IV Levophed infusion. After discussion with patient's family they have made decision for comfort care measures only.  12/23: Patient appears comfortable, apparently had a bed at [facility] healthcare where he can go with hospice help-pending insurance authorization.  12/24: Remained hemodynamically stable and comfortable, awaiting LTC placement with hospice.  12/26: Remains stable, still awaiting placement  12/27: Remained hemodynamically stable, having difficulty with family to decide about LTC.  12/28:Remained stable, family decided on [facility]- pending insurance authorization.    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 Pending transfer to long-term care with comfort   Subjective:  Patient with no new concern  Physical Exam: Vitals:   02/05/23 0536 02/05/23 0809 02/05/23 2315 02/06/23 0801  BP: (!) 198/90 (!) 144/74 (!) 151/89 (!) 142/70   Pulse: 83 79 73 68  Resp: 20 17  14   Temp: 98 F (36.7 C) 97.7 F (36.5 C)  97.9 F (36.6 C)  TempSrc:      SpO2: 100% 100% 100% (!) 81%  Weight:      Height:       General.  Frail and malnourished elderly man, in no acute distress. Pulmonary.  Lungs clear bilaterally, normal respiratory effort. CV.  Regular rate and rhythm, no JVD, rub or murmur. Abdomen.  Soft, nontender, nondistended, BS positive. CNS.  Alert and oriented .  No focal neurologic deficit. Extremities.  No edema, no cyanosis, pulses intact and symmetrical.  Data Reviewed:  There are no new results to review at this time.  Family Communication:   Disposition: Status is: Inpatient Remains inpatient appropriate because: Comfort care.  Time spent: 35 minutes  This record has been created using Conservation officer, historic buildings. Errors have been sought and corrected,but may not always be located. Such creation errors do not reflect on the standard of care.   Author: Arnetha Courser, MD 02/05/2023 12:44 PM  For on call review [email or web address].

## 2023-02-06 NOTE — Discharge Summary (Signed)
 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 [facility] Course: Isaac Hunter is a 82 y.o. male with PMH of CAD, HTN,  type 2 diabetes mellitus, and tobacco abuse, dementia, who presented to the emergency room, from [facility] nephrology clinic, because of bradycardia and decreased responsiveness.  Reportedly, heart rate was between 20s and 40s.  He was also hypothermic (88.3 F) and hypotensive (82/32) in the ED.  He was admitted to the hospital for symptomatic bradycardia, hypotension, and AKI.  He was treated with IV fluids IV Levophed  infusion. After discussion with patient's family they have made decision for comfort care measures only.  12/23: Patient appears comfortable, apparently had a bed at [facility] healthcare where he can go with hospice help-pending insurance authorization.  12/24: Remained hemodynamically stable and comfortable, awaiting LTC placement with hospice.  12/26: Remains stable, still awaiting placement  12/27: Remained hemodynamically stable, having difficulty with family to decide about LTC.  12/28:Remained stable, family decided on [facility]- pending insurance authorization.  12/31: Patient remained hemodynamically stable.  Obtained insurance authorization  and patient is being discharged to [facility] with hospice help for end-of-life care.  Comfort care medications were provided.  Hospice will take over from here.   Consultants: Palliative care Procedures performed: None Disposition: Hospice care Diet recommendation:  Discharge Diet Orders (From admission, onward)     Start     Ordered   02/06/23 0000  Diet - low sodium heart healthy        02/06/23 1231           Dysphagia type 3 thin Liquid DISCHARGE MEDICATION: Allergies as of 02/06/2023   No Known Allergies      Medication List     STOP taking these medications    aspirin  EC 81 MG tablet   atorvastatin  80 MG tablet Commonly known as: LIPITOR   carvedilol  6.25 MG tablet Commonly known as: Coreg    Cholecalciferol 25 MCG (1000 UT) tablet   FeroSul 325 (65 Fe) MG tablet Generic drug: ferrous sulfate    furosemide  20 MG tablet Commonly known as: LASIX        TAKE these medications    acetaminophen  325 MG tablet Commonly known as: TYLENOL  Take 2 tablets (650 mg total) by mouth every 6 (six) hours as needed for mild pain (pain score 1-3) (or Fever >/= 101).   amLODipine  10 MG tablet Commonly known as: NORVASC  Take 1 tablet (10 mg total) by mouth daily. For BP   antiseptic oral rinse Liqd Apply 15 mLs topically as needed for dry mouth.   glycopyrrolate  1 MG tablet Commonly known as: ROBINUL  Take 1 tablet (1 mg total) by mouth every 4 (four) hours as needed (excessive secretions).   HYDROmorphone  2 MG tablet Commonly known as: DILAUDID  Take 1 tablet (2 mg total)  by mouth every 4 (four) hours as needed for severe pain (pain score 7-10). May crush, mix with water  and give sublingually if needed.   LORazepam  2 MG/ML concentrated solution Commonly known as: ATIVAN  Place 0.5 mLs (1 mg total) under the tongue every 4 (four) hours as needed for anxiety.   ondansetron  4 MG disintegrating tablet Commonly known as: ZOFRAN -ODT Take 1 tablet (4 mg total) by mouth  every 6 (six) hours as needed for nausea.   polyvinyl alcohol  1.4 % ophthalmic solution Commonly known as: LIQUIFILM TEARS Place 1 drop into both eyes 4 (four) times daily as needed for dry eyes.   traZODone  50 MG tablet Commonly known as: DESYREL  Take 0.5 tablets (25 mg total) by mouth at bedtime as needed for sleep.        Contact information for after-discharge care     Destination     [facility] .   Service: Skilled Nursing Contact information: [address] [place]  [address] [phone]                    Discharge Exam: Fredricka Weights   01/17/23 1648  Weight: 61.2 kg   General.  Frail and malnourished elderly man, in no acute distress. Pulmonary.  Lungs clear bilaterally, normal respiratory effort. CV.  Regular rate and rhythm, no JVD, rub or murmur. Abdomen.  Soft, nontender, nondistended, BS positive. CNS.  Alert and oriented .  No focal neurologic deficit. Extremities.  No edema, no cyanosis, pulses intact and symmetrical.  Condition at discharge: stable  The results of significant diagnostics from this hospitalization (including imaging, microbiology, ancillary and laboratory) are listed below for reference.   Imaging Studies: DG Chest Port 1 View Result Date: 01/17/2023 CLINICAL DATA:  Altered mental status.  Questionable sepsis. EXAM: PORTABLE CHEST 1 VIEW COMPARISON:  10/05/2022. FINDINGS: Bilateral lung fields are clear. Bilateral costophrenic angles are clear. Stable cardio-mediastinal silhouette. No acute osseous abnormalities. The soft tissues are within normal limits. IMPRESSION: *No active disease. Electronically Signed   By: Ree Molt M.D.   On: 01/17/2023 18:36    Microbiology: Results for orders placed or performed during the hospital encounter of 01/17/23  Blood Culture (routine x 2)     Status: None   Collection Time: 01/17/23  5:59 PM   Specimen: BLOOD  Result Value Ref Range Status    Specimen Description BLOOD BLOOD RIGHT ARM  Final   Special Requests   Final    BOTTLES DRAWN AEROBIC AND ANAEROBIC Blood Culture results may not be optimal due to an inadequate volume of blood received in culture bottles   Culture   Final    NO GROWTH 5 DAYS Performed at [facility], [address], [place], [place] [address]    Report Status 01/22/2023 FINAL  Final  Blood Culture (routine x 2)     Status: None   Collection Time: 01/17/23  5:59 PM   Specimen: BLOOD  Result Value Ref Range Status   Specimen Description BLOOD BLOOD LEFT ARM  Final   Special Requests   Final    BOTTLES DRAWN AEROBIC AND ANAEROBIC Blood Culture adequate volume   Culture   Final    NO GROWTH 5 DAYS Performed at [facility], [address] [place], [place] [address]    Report Status 01/22/2023 FINAL  Final    Labs: CBC: No results for input(s): WBC, NEUTROABS, HGB, HCT, MCV, PLT in the last 168 hours. Basic Metabolic  Panel: No results for input(s): NA, K, CL, CO2, GLUCOSE, BUN, CREATININE, CALCIUM , MG, PHOS in the last 168 hours. Liver Function Tests: No results for input(s): AST, ALT, ALKPHOS, BILITOT, PROT, ALBUMIN in the last 168 hours. CBG: No results for input(s): GLUCAP in the last 168 hours.  Discharge time spent: greater than 30 minutes.  This record has been created using Conservation officer, historic buildings. Errors have been sought and corrected,but may not always be located. Such creation errors do not reflect on the standard of care.   Signed: Amaryllis Dare, MD [facility] 02/06/2023

## 2023-02-14 ENCOUNTER — Ambulatory Visit: Payer: 59 | Admitting: Urology

## 2023-03-10 DEATH — deceased
